# Patient Record
Sex: Female | Born: 1970 | Race: Black or African American | Hispanic: No | Marital: Married | Smoking: Former smoker
Health system: Southern US, Community
[De-identification: ages and names within clinical notes are randomized; demographics above are authoritative.]

## PROBLEM LIST (undated history)

## (undated) DIAGNOSIS — I639 Cerebral infarction, unspecified: Secondary | ICD-10-CM

## (undated) DIAGNOSIS — M1990 Unspecified osteoarthritis, unspecified site: Secondary | ICD-10-CM

## (undated) DIAGNOSIS — E119 Type 2 diabetes mellitus without complications: Secondary | ICD-10-CM

## (undated) DIAGNOSIS — G4700 Insomnia, unspecified: Secondary | ICD-10-CM

## (undated) DIAGNOSIS — I10 Essential (primary) hypertension: Secondary | ICD-10-CM

## (undated) DIAGNOSIS — L720 Epidermal cyst: Secondary | ICD-10-CM

## (undated) DIAGNOSIS — I517 Cardiomegaly: Secondary | ICD-10-CM

## (undated) DIAGNOSIS — N83202 Unspecified ovarian cyst, left side: Secondary | ICD-10-CM

## (undated) DIAGNOSIS — F32A Depression, unspecified: Secondary | ICD-10-CM

## (undated) DIAGNOSIS — E785 Hyperlipidemia, unspecified: Secondary | ICD-10-CM

## (undated) DIAGNOSIS — F419 Anxiety disorder, unspecified: Secondary | ICD-10-CM

## (undated) DIAGNOSIS — F329 Major depressive disorder, single episode, unspecified: Secondary | ICD-10-CM

## (undated) DIAGNOSIS — K219 Gastro-esophageal reflux disease without esophagitis: Secondary | ICD-10-CM

## (undated) DIAGNOSIS — M79671 Pain in right foot: Secondary | ICD-10-CM

## (undated) DIAGNOSIS — N83201 Unspecified ovarian cyst, right side: Secondary | ICD-10-CM

## (undated) DIAGNOSIS — G4730 Sleep apnea, unspecified: Secondary | ICD-10-CM

## (undated) DIAGNOSIS — E669 Obesity, unspecified: Secondary | ICD-10-CM

## (undated) DIAGNOSIS — F1910 Other psychoactive substance abuse, uncomplicated: Secondary | ICD-10-CM

## (undated) DIAGNOSIS — D219 Benign neoplasm of connective and other soft tissue, unspecified: Secondary | ICD-10-CM

## (undated) DIAGNOSIS — J45909 Unspecified asthma, uncomplicated: Secondary | ICD-10-CM

## (undated) HISTORY — DX: Unspecified asthma, uncomplicated: J45.909

## (undated) HISTORY — DX: Depression, unspecified: F32.A

## (undated) HISTORY — PX: KNEE SURGERY: SHX244

## (undated) HISTORY — PX: CHOLECYSTECTOMY: SHX55

## (undated) HISTORY — DX: Type 2 diabetes mellitus without complications: E11.9

## (undated) HISTORY — DX: Major depressive disorder, single episode, unspecified: F32.9

## (undated) HISTORY — DX: Other psychoactive substance abuse, uncomplicated: F19.10

## (undated) HISTORY — PX: HEEL SPUR SURGERY: SHX665

## (undated) HISTORY — PX: TUBAL LIGATION: SHX77

---

## 1998-04-04 ENCOUNTER — Emergency Department (HOSPITAL_COMMUNITY): Admission: EM | Admit: 1998-04-04 | Discharge: 1998-04-04 | Payer: Self-pay | Admitting: Emergency Medicine

## 1998-04-07 ENCOUNTER — Ambulatory Visit (HOSPITAL_COMMUNITY): Admission: RE | Admit: 1998-04-07 | Discharge: 1998-04-07 | Payer: Self-pay

## 1998-05-12 ENCOUNTER — Ambulatory Visit (HOSPITAL_COMMUNITY): Admission: RE | Admit: 1998-05-12 | Discharge: 1998-05-13 | Payer: Self-pay | Admitting: General Surgery

## 1999-01-14 ENCOUNTER — Emergency Department (HOSPITAL_COMMUNITY): Admission: EM | Admit: 1999-01-14 | Discharge: 1999-01-14 | Payer: Self-pay | Admitting: Emergency Medicine

## 2000-07-20 ENCOUNTER — Emergency Department (HOSPITAL_COMMUNITY): Admission: EM | Admit: 2000-07-20 | Discharge: 2000-07-20 | Payer: Self-pay | Admitting: Emergency Medicine

## 2000-07-20 ENCOUNTER — Encounter: Payer: Self-pay | Admitting: Emergency Medicine

## 2000-10-17 ENCOUNTER — Emergency Department (HOSPITAL_COMMUNITY): Admission: EM | Admit: 2000-10-17 | Discharge: 2000-10-17 | Payer: Self-pay | Admitting: Emergency Medicine

## 2000-12-15 ENCOUNTER — Emergency Department (HOSPITAL_COMMUNITY): Admission: EM | Admit: 2000-12-15 | Discharge: 2000-12-15 | Payer: Self-pay | Admitting: Emergency Medicine

## 2000-12-15 ENCOUNTER — Encounter: Payer: Self-pay | Admitting: Emergency Medicine

## 2001-08-25 ENCOUNTER — Other Ambulatory Visit: Admission: RE | Admit: 2001-08-25 | Discharge: 2001-08-25 | Payer: Self-pay | Admitting: Obstetrics and Gynecology

## 2001-10-03 ENCOUNTER — Emergency Department (HOSPITAL_COMMUNITY): Admission: EM | Admit: 2001-10-03 | Discharge: 2001-10-03 | Payer: Self-pay | Admitting: Emergency Medicine

## 2002-02-23 ENCOUNTER — Emergency Department (HOSPITAL_COMMUNITY): Admission: EM | Admit: 2002-02-23 | Discharge: 2002-02-23 | Payer: Self-pay | Admitting: Emergency Medicine

## 2002-03-04 ENCOUNTER — Emergency Department (HOSPITAL_COMMUNITY): Admission: EM | Admit: 2002-03-04 | Discharge: 2002-03-04 | Payer: Self-pay | Admitting: Emergency Medicine

## 2002-06-26 ENCOUNTER — Emergency Department (HOSPITAL_COMMUNITY): Admission: EM | Admit: 2002-06-26 | Discharge: 2002-06-26 | Payer: Self-pay | Admitting: Emergency Medicine

## 2002-06-29 ENCOUNTER — Emergency Department (HOSPITAL_COMMUNITY): Admission: EM | Admit: 2002-06-29 | Discharge: 2002-06-29 | Payer: Self-pay | Admitting: Emergency Medicine

## 2003-10-10 ENCOUNTER — Emergency Department (HOSPITAL_COMMUNITY): Admission: EM | Admit: 2003-10-10 | Discharge: 2003-10-10 | Payer: Self-pay | Admitting: Emergency Medicine

## 2003-10-15 ENCOUNTER — Emergency Department (HOSPITAL_COMMUNITY): Admission: EM | Admit: 2003-10-15 | Discharge: 2003-10-15 | Payer: Self-pay | Admitting: Emergency Medicine

## 2004-08-06 ENCOUNTER — Emergency Department (HOSPITAL_COMMUNITY): Admission: EM | Admit: 2004-08-06 | Discharge: 2004-08-06 | Payer: Self-pay | Admitting: Emergency Medicine

## 2004-11-19 ENCOUNTER — Emergency Department (HOSPITAL_COMMUNITY): Admission: EM | Admit: 2004-11-19 | Discharge: 2004-11-20 | Payer: Self-pay | Admitting: Emergency Medicine

## 2005-08-09 ENCOUNTER — Emergency Department (HOSPITAL_COMMUNITY): Admission: EM | Admit: 2005-08-09 | Discharge: 2005-08-09 | Payer: Self-pay | Admitting: Emergency Medicine

## 2005-09-25 ENCOUNTER — Emergency Department (HOSPITAL_COMMUNITY): Admission: EM | Admit: 2005-09-25 | Discharge: 2005-09-26 | Payer: Self-pay | Admitting: Emergency Medicine

## 2006-03-15 ENCOUNTER — Emergency Department (HOSPITAL_COMMUNITY): Admission: EM | Admit: 2006-03-15 | Discharge: 2006-03-15 | Payer: Self-pay | Admitting: *Deleted

## 2006-10-13 ENCOUNTER — Ambulatory Visit: Payer: Self-pay | Admitting: Obstetrics and Gynecology

## 2006-10-30 ENCOUNTER — Encounter: Admission: RE | Admit: 2006-10-30 | Discharge: 2006-10-30 | Payer: Self-pay | Admitting: Obstetrics and Gynecology

## 2006-11-02 ENCOUNTER — Ambulatory Visit (HOSPITAL_COMMUNITY): Admission: RE | Admit: 2006-11-02 | Discharge: 2006-11-02 | Payer: Self-pay | Admitting: Obstetrics and Gynecology

## 2007-01-08 ENCOUNTER — Emergency Department (HOSPITAL_COMMUNITY): Admission: EM | Admit: 2007-01-08 | Discharge: 2007-01-08 | Payer: Self-pay | Admitting: *Deleted

## 2007-05-03 ENCOUNTER — Emergency Department (HOSPITAL_COMMUNITY): Admission: EM | Admit: 2007-05-03 | Discharge: 2007-05-04 | Payer: Self-pay | Admitting: Emergency Medicine

## 2007-05-18 ENCOUNTER — Ambulatory Visit (HOSPITAL_COMMUNITY): Admission: AD | Admit: 2007-05-18 | Discharge: 2007-05-20 | Payer: Self-pay | Admitting: Orthopedic Surgery

## 2007-08-09 ENCOUNTER — Ambulatory Visit: Payer: Self-pay | Admitting: *Deleted

## 2007-08-14 ENCOUNTER — Ambulatory Visit (HOSPITAL_COMMUNITY): Admission: RE | Admit: 2007-08-14 | Discharge: 2007-08-14 | Payer: Self-pay | Admitting: Obstetrics & Gynecology

## 2008-04-02 ENCOUNTER — Emergency Department (HOSPITAL_COMMUNITY): Admission: EM | Admit: 2008-04-02 | Discharge: 2008-04-02 | Payer: Self-pay | Admitting: Emergency Medicine

## 2008-06-10 ENCOUNTER — Emergency Department (HOSPITAL_COMMUNITY): Admission: EM | Admit: 2008-06-10 | Discharge: 2008-06-11 | Payer: Self-pay | Admitting: Emergency Medicine

## 2008-10-30 ENCOUNTER — Emergency Department (HOSPITAL_COMMUNITY): Admission: EM | Admit: 2008-10-30 | Discharge: 2008-10-30 | Payer: Self-pay | Admitting: Emergency Medicine

## 2009-02-22 ENCOUNTER — Emergency Department (HOSPITAL_COMMUNITY): Admission: EM | Admit: 2009-02-22 | Discharge: 2009-02-22 | Payer: Self-pay | Admitting: Emergency Medicine

## 2009-04-15 ENCOUNTER — Emergency Department (HOSPITAL_COMMUNITY): Admission: EM | Admit: 2009-04-15 | Discharge: 2009-04-16 | Payer: Self-pay | Admitting: Emergency Medicine

## 2009-05-07 ENCOUNTER — Encounter (INDEPENDENT_AMBULATORY_CARE_PROVIDER_SITE_OTHER): Payer: Self-pay | Admitting: Obstetrics & Gynecology

## 2009-05-07 ENCOUNTER — Ambulatory Visit: Payer: Self-pay | Admitting: Obstetrics and Gynecology

## 2009-07-27 ENCOUNTER — Emergency Department (HOSPITAL_COMMUNITY): Admission: EM | Admit: 2009-07-27 | Discharge: 2009-07-27 | Payer: Self-pay | Admitting: Emergency Medicine

## 2009-10-12 ENCOUNTER — Emergency Department (HOSPITAL_COMMUNITY): Admission: EM | Admit: 2009-10-12 | Discharge: 2009-10-12 | Payer: Self-pay | Admitting: Emergency Medicine

## 2009-11-04 ENCOUNTER — Emergency Department (HOSPITAL_COMMUNITY): Admission: EM | Admit: 2009-11-04 | Discharge: 2009-11-04 | Payer: Self-pay | Admitting: Emergency Medicine

## 2010-02-17 ENCOUNTER — Emergency Department (HOSPITAL_COMMUNITY): Admission: EM | Admit: 2010-02-17 | Discharge: 2010-02-17 | Payer: Self-pay | Admitting: Emergency Medicine

## 2010-05-24 ENCOUNTER — Encounter: Admission: RE | Admit: 2010-05-24 | Discharge: 2010-05-24 | Payer: Self-pay | Admitting: Orthopedic Surgery

## 2010-06-12 ENCOUNTER — Emergency Department (HOSPITAL_COMMUNITY): Admission: EM | Admit: 2010-06-12 | Discharge: 2010-06-12 | Payer: Self-pay | Admitting: Emergency Medicine

## 2010-08-09 ENCOUNTER — Emergency Department (HOSPITAL_COMMUNITY): Admission: EM | Admit: 2010-08-09 | Discharge: 2010-08-09 | Payer: Self-pay | Admitting: Emergency Medicine

## 2010-09-22 ENCOUNTER — Encounter
Admission: RE | Admit: 2010-09-22 | Discharge: 2010-09-22 | Payer: Self-pay | Source: Home / Self Care | Attending: Orthopedic Surgery | Admitting: Orthopedic Surgery

## 2010-10-01 ENCOUNTER — Emergency Department (HOSPITAL_COMMUNITY)
Admission: EM | Admit: 2010-10-01 | Discharge: 2010-10-01 | Payer: Self-pay | Source: Home / Self Care | Admitting: Emergency Medicine

## 2010-11-01 ENCOUNTER — Encounter: Payer: Self-pay | Admitting: Specialist

## 2010-11-01 ENCOUNTER — Encounter: Payer: Self-pay | Admitting: Obstetrics and Gynecology

## 2010-11-01 ENCOUNTER — Encounter: Payer: Self-pay | Admitting: Orthopedic Surgery

## 2010-11-02 ENCOUNTER — Encounter: Payer: Self-pay | Admitting: *Deleted

## 2010-11-03 ENCOUNTER — Ambulatory Visit (HOSPITAL_COMMUNITY): Admission: RE | Admit: 2010-11-03 | Payer: Self-pay | Source: Home / Self Care

## 2010-11-11 ENCOUNTER — Encounter: Payer: Self-pay | Admitting: Orthopedic Surgery

## 2010-12-04 ENCOUNTER — Emergency Department (HOSPITAL_COMMUNITY): Payer: Self-pay

## 2010-12-04 ENCOUNTER — Emergency Department (HOSPITAL_COMMUNITY)
Admission: EM | Admit: 2010-12-04 | Discharge: 2010-12-04 | Disposition: A | Discharge status: Home / Self Care | Payer: Self-pay | Attending: Emergency Medicine | Admitting: Emergency Medicine

## 2010-12-04 DIAGNOSIS — M25519 Pain in unspecified shoulder: Secondary | ICD-10-CM | POA: Insufficient documentation

## 2010-12-04 DIAGNOSIS — E119 Type 2 diabetes mellitus without complications: Secondary | ICD-10-CM | POA: Insufficient documentation

## 2010-12-04 DIAGNOSIS — I10 Essential (primary) hypertension: Secondary | ICD-10-CM | POA: Insufficient documentation

## 2011-01-11 ENCOUNTER — Emergency Department (HOSPITAL_COMMUNITY)
Admission: EM | Admit: 2011-01-11 | Discharge: 2011-01-11 | Disposition: A | Discharge status: Home / Self Care | Payer: Self-pay | Attending: Emergency Medicine | Admitting: Emergency Medicine

## 2011-01-11 DIAGNOSIS — R059 Cough, unspecified: Secondary | ICD-10-CM | POA: Insufficient documentation

## 2011-01-11 DIAGNOSIS — Z9981 Dependence on supplemental oxygen: Secondary | ICD-10-CM | POA: Insufficient documentation

## 2011-01-11 DIAGNOSIS — J4489 Other specified chronic obstructive pulmonary disease: Secondary | ICD-10-CM | POA: Insufficient documentation

## 2011-01-11 DIAGNOSIS — J449 Chronic obstructive pulmonary disease, unspecified: Secondary | ICD-10-CM | POA: Insufficient documentation

## 2011-01-11 DIAGNOSIS — R05 Cough: Secondary | ICD-10-CM | POA: Insufficient documentation

## 2011-01-11 DIAGNOSIS — R062 Wheezing: Secondary | ICD-10-CM | POA: Insufficient documentation

## 2011-01-14 LAB — CBC
HCT: 38.9 % (ref 36.0–46.0)
Hemoglobin: 13 g/dL (ref 12.0–15.0)
MCV: 90.3 fL (ref 78.0–100.0)
Platelets: 398 10*3/uL (ref 150–400)
RDW: 14.4 % (ref 11.5–15.5)
WBC: 8.1 10*3/uL (ref 4.0–10.5)

## 2011-01-14 LAB — URINALYSIS, ROUTINE W REFLEX MICROSCOPIC
Ketones, ur: NEGATIVE mg/dL
Nitrite: NEGATIVE
Protein, ur: NEGATIVE mg/dL

## 2011-01-14 LAB — DIFFERENTIAL
Eosinophils Absolute: 0.2 10*3/uL (ref 0.0–0.7)
Eosinophils Relative: 3 % (ref 0–5)
Lymphocytes Relative: 43 % (ref 12–46)
Lymphs Abs: 3.5 10*3/uL (ref 0.7–4.0)
Monocytes Absolute: 0.3 10*3/uL (ref 0.1–1.0)

## 2011-01-19 LAB — COMPREHENSIVE METABOLIC PANEL
AST: 21 U/L (ref 0–37)
BUN: 14 mg/dL (ref 6–23)
CO2: 24 mEq/L (ref 19–32)
Calcium: 9.3 mg/dL (ref 8.4–10.5)
Chloride: 106 mEq/L (ref 96–112)
Creatinine, Ser: 1.1 mg/dL (ref 0.4–1.2)
GFR calc non Af Amer: 55 mL/min — ABNORMAL LOW (ref >60)
Glucose, Bld: 110 mg/dL — ABNORMAL HIGH (ref 70–99)
Total Bilirubin: 0.5 mg/dL (ref 0.3–1.2)

## 2011-01-19 LAB — CBC
HCT: 37.3 % (ref 36.0–46.0)
MCHC: 33 g/dL (ref 30.0–36.0)
MCV: 89.8 fL (ref 78.0–100.0)
Platelets: 370 10*3/uL (ref 150–400)
RDW: 14.3 % (ref 11.5–15.5)

## 2011-01-19 LAB — LIPASE, BLOOD: Lipase: 20 U/L (ref 11–59)

## 2011-01-19 LAB — DIFFERENTIAL
Basophils Absolute: 0 10*3/uL (ref 0.0–0.1)
Eosinophils Relative: 1 % (ref 0–5)
Lymphocytes Relative: 36 % (ref 12–46)
Neutro Abs: 4.9 10*3/uL (ref 1.7–7.7)
Neutrophils Relative %: 57 % (ref 43–77)

## 2011-01-19 LAB — TROPONIN I: Troponin I: 0.01 ng/mL (ref 0.00–0.06)

## 2011-01-19 LAB — CK TOTAL AND CKMB (NOT AT ARMC): Total CK: 158 U/L (ref 7–177)

## 2011-02-23 NOTE — Group Therapy Note (Signed)
Joanna Newman, Joanna Newman NO.:  [id number]   MEDICAL RECORD NO.:  [id number]          PATIENT TYPE:  WOC   LOCATION:  [facility]                   FACILITY:  [facility]   PHYSICIAN:  Dorthula Perfect, MD     DATE OF BIRTH:  1970/11/20   DATE OF SERVICE:  05/07/2009                                  CLINIC NOTE   A 40 year old African American female gravida 3, para 3, last menstrual  period the first part of July, who comes today for Pap smear, annual  visit and STD testing.  Her husband is a Naval architect.  She was last  seen here in October 2008.  At that time, she had a history of a right  ovarian cyst.  She is having a problem with cramping with her periods.   Her menstrual periods are cyclic and last at least 10 days and often has  spotting the rest of the month.  She will use up to 3 overnight pads at  a time.  She has often lot of clotting.  She was seen in the medical  clinic a few weeks ago and was told that she had low blood, and was  put on ferrous sulfate.   Ultrasound in November 2008 showed a stable cervical fibroid.  It showed  a right unilocular cyst measuring 4.5 x 3.4 x 3.4 cm.  She was supposed  to come back for a followup, but did not return.   OPERATIONS:  Tubal sterilization, cholecystectomy, and umbilical hernia.   ALLERGIES:  None.   MEDICATIONS:  Listed in the front of the chart.   FAMILY HISTORY:  Significant and that a maternal grandmother who has had  breast cancer.  The patient has not had a mammogram.   Exam; height 5 feet 4, weight 319 pounds, blood pressure 122/74.  Thyroid is normal.  Her breasts are bilaterally normal.  No masses or  cysts are noted.  Her abdomen is very much obese.  No masses are felt.  She states during the exam that every now and then she gets sort of a  rolling pain in the upper abdomen that goes from one side to the other.  She has no history of constipation or diarrhea.  Pelvic exam, external  genitalia and BUS  glands are normal.  Vaginal vault epithelialized as  was the cervix.  The uterus and the adnexal areas were examined, but  could not be felt because of her size.   IMPRESSION:  1. Menometrorrhagia with secondary anemia by history.  2. Upper abdominal pain.   DISPOSITION:  1. Pap smear with STD testing.  2. She was scheduled for an ultrasound to reevaluate the uterus and      the ovaries.  3. Return in 2-3 weeks for results and endometrial biopsy at that      time.           ______________________________  Dorthula Perfect, MD     ER/MEDQ  D:  05/07/2009  T:  05/08/2009  Job:  [id number]

## 2011-02-23 NOTE — Op Note (Signed)
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   PROCEDURE:  Bilateral gastrocnemius resection.   SURGEON:  Aldean Baker, MD   ANESTHESIA:  General.   ESTIMATED BLOOD LOSS:  Minimal.   ANTIBIOTICS:  2 gram of Kefzol.   DRAINS:  None.   COMPLICATIONS:  None.   TOURNIQUET TIME:  None.   DISPOSITION:  To PACU in stable condition.   INDICATIONS FOR PROCEDURE:  Patient is a 40 year old woman with  bilateral heel cord contractures.  She has failed conservative care with  heel cord stretching, has persistent plantar fascia, and Achilles  tendinitis.  She presents at this time for surgical intervention.  Risks  and benefits were discussed, including infection, neurovascular injury,  numbness, nonhealing of wound, need for additional surgery.  Patient  states she understands and wishes to proceed at this time.   DESCRIPTION OF PROCEDURE:  Patient was brought OR 1 and underwent a  general anesthetic.  After adequate levels of anesthesia obtained, the  patient's both lower extremities were prepped using DuraPrep and draped  into a sterile field.  Attention was first focused on the left lower  extremity.  A medial longitudinal incision was made 15 cm proximal to  the medial malleolus.  Blunt dissection was carried down to the  gastrocnemius fascia and the gastrocnemius fascia was released.  This  brought the patient's foot from neutral dorsiflexion to about 30 degrees  of dorsiflexion.  The wound was irrigated and the incision was  closed  using a modified vertical mattress suture with 2-0 nylon. Attention was  then focused on the right gastrocnemius fascia.  A vertical longitudinal  incision was also made 15 cm proximal to the medial malleolus.  Blunt  dissection was carried down to the gastrocnemius fascia, which was then  released.  The patient's dorsiflexion went from neutral dorsiflexion to  about 30 degrees of dorsiflexion.  The wound was irrigated and the  incision closed with a modified vertical mattress suture with 2-0  nylon.  The wounds were covered with Adaptic, orthopedic sponges, Kerlix  and a Coban dressing.  The patient was extubated, taken to PACU in  stable condition.  Plans are for a 23-hour observation due to the fact  that the patient requires nasal cannula O2 in the evening and plan for  discharge in the morning after physical therapy.      Nadara Mustard, MD  Electronically Signed     MVD/MEDQ  D:  05/18/2007  T:  05/18/2007  Job:  [id number]

## 2011-02-23 NOTE — Group Therapy Note (Signed)
NAMEMONEY, MCKEITHAN NO.:  [id number]   MEDICAL RECORD NO.:  [id number]          PATIENT TYPE:  WOC   LOCATION:  [facility]                   FACILITY:  [facility]   PHYSICIAN:  Karlton Lemon, MD      DATE OF BIRTH:  [birth year]   DATE OF SERVICE:                                  CLINIC NOTE   CHIEF COMPLAINT:  Follow up Pap smear and cultures.   HISTORY OF PRESENT ILLNESS:  This is a 40 year old gravida 5, para 3 who  comes in for a followup Pap smear.  Patient states that she was  instructed to follow up at this time for Pap smear.  In review of her  chart, her last Pap smear was in January, 2008 and was found to be  negative at that time.  She was sent a letter stating to follow up in  January, 2009.  Discussed options for her getting a Pap smear today  versus waiting until January.  Due to Medicaid constraints on repeating  a Pap smear early, the patient would like to wait until January, 2009.  In review of her chart, it was noted that she had a transabdominal and  transvaginal pelvic ultrasound performed on November 02, 2006 showing a  right ovarian simple cyst.  Recommendation at that time was to follow up  in 6-8 weeks to determine resolution.  Patient states that she felt like  this ultrasound was done 6-8 weeks later showing resolution of the cyst.  This report was not found, and there is some question as to whether this  study was performed.  Patient states that her pelvic pain has resolved  and that her pain has lightened.  She states that her periods are now  monthly, and she does have some severe cramping during them, but she  states that this is controlled well with Vicodin, which she has received  for surgery to her ankles and chronic pain related to that.  Patient has  no other complaints today.  Would like to follow up anal examination and  Pap smear in January, 2009.   PAST MEDICAL HISTORY:  Significant for allergies, diabetes, depression,  asthma,  and hypertension.   MEDICATIONS:  Albuterol, Zyrtec, Benicar, hydrocodone, Singulair,  Metformin, Lorazepam, Celexa.   PAST SURGICAL HISTORY:  Bilateral ankle surgery.  Patient has had no  other surgeries.   ALLERGIES:  Patient has no known drug allergies.   PHYSICAL EXAMINATION:  GENERAL:  This is a well-appearing, obese female  in no distress.  VITALS:  Temperature 96.9, pulse 84, blood pressure 116/83.  Weight 115  pounds.  CARDIOVASCULAR:  Heart has a regular rate and rhythm.  No murmurs, rubs  or gallops.  RESPIRATORY:  Lungs are clear to auscultation bilaterally.  ABDOMEN:  Obese, soft, nontender to palpation.  Positive bowel sounds  noted in all four quadrants.  No masses are palpable.  GENITOURINARY:  Speculum exam is not performed.  Bimanual examination is  difficult due to body habitus.  Uterus feels normal size, but this is  difficult to evaluate.  Adnexa are unable to be palpated secondary to  body  habitus.   ASSESSMENT/PLAN:  This is a 40 year old gravida 5, para 3 with a history  of right simple ovarian cyst and is following for Pap smear, although  she is deferring this to one year from her previous Pap smear.  1. Will schedule for a repeat pelvic ultrasound to follow this ovarian      cyst, as recommended back in January.  Expect resolution.  2. Patient is to follow up in four weeks for discussion of results of      this ultrasound.  3. Patient is to follow up in January, 2009 for yearly examination,      including Pap smear.           ______________________________  Karlton Lemon, MD     NS/MEDQ  D:  08/09/2007  T:  08/10/2007  Job:  [id number]

## 2011-02-26 NOTE — Group Therapy Note (Signed)
Joanna Newman, ECKART NO.:  [id number]   MEDICAL RECORD NO.:  [id number]          PATIENT TYPE:  WOC   LOCATION:  [facility]                   FACILITY:  [facility]   PHYSICIAN:  Argentina Donovan, MD        DATE OF BIRTH:  1971/01/07   DATE OF SERVICE:  10/13/2006                                  CLINIC NOTE   The patient is a 40 year old G5, P3 who comes in as a followup on an  ultrasound, back pain, and for a pap smear.   PROBLEMS:  1. Ultrasound/back pain.  The patient has had increased vaginal      bleeding since 05/2006 when she bleeds in between her periods at      times.  She also has had increasing back pain for a similar time.      She describes the back pain as along her spine and radiating down      both legs. The pain is on the right side or the left side.  She had      an ultrasound 09/09/2006 which showed a right complex cyst and they      recommended getting a followup ultrasound in 6-8 weeks.  The      patient says that she has had no loss of blood or bowel function.  2. Rectal bleeding.  The patient says that she has had bright red      blood per rectum.  She does have a history of hemorrhoids, but she      says that this feels different.  3. Pap smear.  The patient is due for a Pap.  She has had abnormal      Paps in the past, and a colposcopy in 2000.  She has not had any      abnormal Pap smears since then.   PHYSICAL EXAMINATION:  GENERAL:  An obese appearing African-American  female in no apparent distress.  GU EXAM:  Normal mons and labia.  Cervix appears normal with no exudate  or discharge.  There was no cervical motion tenderness.  A Pap smear was  done.  On bimanual exam there was a normal-appearing uterus.  Ovaries  were not felt.   ASSESSMENT/PLAN:  1. Back pain.  It was felt that the pain was more likely related to      possible spinal abnormality as opposed to a small, right ovarian      complex cyst.  We plan to get an MRI of the lower  lumbar spine to      evaluate for disk disease and then refer on to an orthopedic versus      a neurosurgeon.  For her right ovarian cyst we will have a followup      ultrasound at 6 weeks' to make sure that this is decreasing in      size.  2. Rectal lesion.  The patient is __________ for colon cancer.  We      will have the patient see Dr. Loreta Ave plan on getting a colonoscopy,      this of course is more likely hemorrhoids.  3.  Pap smear.  A Pap was done today.  Again, the patient has a history      of abnormal Paps.  It is important for her to continue to followup      annually.   Dictated by Rolm Gala, M.D. for Argentina Donovan, MD     ______________________________  Argentina Donovan, MD    ______________________________  Argentina Donovan, MD    PR/MEDQ  D:  10/13/2006  T:  10/13/2006  Job:  [id number]

## 2011-07-08 LAB — DIFFERENTIAL
Basophils Absolute: 0.1
Basophils Relative: 1
Eosinophils Absolute: 0.1
Eosinophils Relative: 1
Lymphocytes Relative: 36
Lymphs Abs: 3.7
Monocytes Absolute: 0.5
Monocytes Relative: 5
Neutro Abs: 5.8
Neutrophils Relative %: 57

## 2011-07-08 LAB — POCT CARDIAC MARKERS
CKMB, poc: 1.3
Myoglobin, poc: 63
Operator id: 290111
Troponin i, poc: <0.05

## 2011-07-08 LAB — CBC
HCT: 38.1
Hemoglobin: 12.8
MCHC: 33.5
MCV: 88.9
Platelets: 496 — ABNORMAL HIGH
RBC: 4.28
RDW: 14.8
WBC: 10.2

## 2011-07-08 LAB — RAPID URINE DRUG SCREEN, HOSP PERFORMED
Amphetamines: NOT DETECTED
Barbiturates: NOT DETECTED
Benzodiazepines: POSITIVE — AB
Cocaine: POSITIVE — AB
Opiates: NOT DETECTED
Tetrahydrocannabinol: NOT DETECTED

## 2011-07-08 LAB — BASIC METABOLIC PANEL
BUN: 13
CO2: 25
Chloride: 101
Potassium: 3.5

## 2011-07-08 LAB — BASIC METABOLIC PANEL WITH GFR
Calcium: 8.8
Creatinine, Ser: 1.08
GFR calc Af Amer: >60
GFR calc non Af Amer: 57 — ABNORMAL LOW
Glucose, Bld: 112 — ABNORMAL HIGH
Sodium: 135

## 2011-07-08 LAB — D-DIMER, QUANTITATIVE: D-Dimer, Quant: <0.22

## 2011-07-08 LAB — B-NATRIURETIC PEPTIDE (CONVERTED LAB): Pro B Natriuretic peptide (BNP): 36.9

## 2011-07-26 LAB — BASIC METABOLIC PANEL
GFR calc Af Amer: >60
GFR calc non Af Amer: >60
Potassium: 4
Sodium: 136

## 2011-07-26 LAB — COMPREHENSIVE METABOLIC PANEL
ALT: 22
Calcium: 8.7
Glucose, Bld: 118 — ABNORMAL HIGH
Sodium: 134 — ABNORMAL LOW
Total Protein: 6.6

## 2011-07-26 LAB — CBC
HCT: 36.6
Hemoglobin: 12.3
Hemoglobin: 12.4
MCHC: 34.4
Platelets: 435 — ABNORMAL HIGH
RBC: 4.07
RDW: 14.2 — ABNORMAL HIGH
WBC: 9.8

## 2011-07-26 LAB — DIFFERENTIAL
Eosinophils Absolute: 0.1
Lymphocytes Relative: 37
Lymphs Abs: 4 — ABNORMAL HIGH
Monocytes Relative: 3
Neutro Abs: 6.2
Neutrophils Relative %: 58

## 2011-07-26 LAB — POCT CARDIAC MARKERS
CKMB, poc: <1 — ABNORMAL LOW
Myoglobin, poc: 59.9
Troponin i, poc: <0.05

## 2011-07-26 LAB — HEPATIC FUNCTION PANEL
ALT: 20
AST: 22
Bilirubin, Direct: 0.1
Total Bilirubin: 0.4

## 2011-07-27 ENCOUNTER — Emergency Department (HOSPITAL_COMMUNITY)
Admission: EM | Admit: 2011-07-27 | Discharge: 2011-07-27 | Disposition: A | Discharge status: Home / Self Care | Payer: Self-pay | Attending: Emergency Medicine | Admitting: Emergency Medicine

## 2011-07-27 ENCOUNTER — Emergency Department (HOSPITAL_COMMUNITY): Payer: Self-pay

## 2011-07-27 DIAGNOSIS — R0602 Shortness of breath: Secondary | ICD-10-CM | POA: Insufficient documentation

## 2011-07-27 DIAGNOSIS — I10 Essential (primary) hypertension: Secondary | ICD-10-CM | POA: Insufficient documentation

## 2011-07-27 DIAGNOSIS — R071 Chest pain on breathing: Secondary | ICD-10-CM | POA: Insufficient documentation

## 2011-07-27 DIAGNOSIS — R079 Chest pain, unspecified: Secondary | ICD-10-CM | POA: Insufficient documentation

## 2011-07-27 DIAGNOSIS — R0989 Other specified symptoms and signs involving the circulatory and respiratory systems: Secondary | ICD-10-CM | POA: Insufficient documentation

## 2011-07-27 DIAGNOSIS — R0609 Other forms of dyspnea: Secondary | ICD-10-CM | POA: Insufficient documentation

## 2011-08-10 ENCOUNTER — Emergency Department (HOSPITAL_COMMUNITY): Payer: Self-pay

## 2011-08-10 ENCOUNTER — Emergency Department (HOSPITAL_COMMUNITY)
Admission: EM | Admit: 2011-08-10 | Discharge: 2011-08-11 | Disposition: A | Discharge status: Home / Self Care | Payer: Self-pay | Attending: Emergency Medicine | Admitting: Emergency Medicine

## 2011-08-10 DIAGNOSIS — M545 Low back pain, unspecified: Secondary | ICD-10-CM | POA: Insufficient documentation

## 2011-08-10 DIAGNOSIS — F3289 Other specified depressive episodes: Secondary | ICD-10-CM | POA: Insufficient documentation

## 2011-08-10 DIAGNOSIS — R079 Chest pain, unspecified: Secondary | ICD-10-CM | POA: Insufficient documentation

## 2011-08-10 DIAGNOSIS — R0602 Shortness of breath: Secondary | ICD-10-CM | POA: Insufficient documentation

## 2011-08-10 DIAGNOSIS — J449 Chronic obstructive pulmonary disease, unspecified: Secondary | ICD-10-CM | POA: Insufficient documentation

## 2011-08-10 DIAGNOSIS — J4489 Other specified chronic obstructive pulmonary disease: Secondary | ICD-10-CM | POA: Insufficient documentation

## 2011-08-10 DIAGNOSIS — F329 Major depressive disorder, single episode, unspecified: Secondary | ICD-10-CM | POA: Insufficient documentation

## 2011-08-10 DIAGNOSIS — I10 Essential (primary) hypertension: Secondary | ICD-10-CM | POA: Insufficient documentation

## 2011-09-01 ENCOUNTER — Emergency Department (HOSPITAL_COMMUNITY)
Admission: EM | Admit: 2011-09-01 | Discharge: 2011-09-01 | Disposition: A | Discharge status: Home / Self Care | Payer: Self-pay | Attending: Emergency Medicine | Admitting: Emergency Medicine

## 2011-09-01 DIAGNOSIS — J45909 Unspecified asthma, uncomplicated: Secondary | ICD-10-CM | POA: Insufficient documentation

## 2011-09-01 MED ORDER — IPRATROPIUM BROMIDE 0.02 % IN SOLN
0.5000 mg | Freq: Once | RESPIRATORY_TRACT | Status: AC
  Administered 2011-09-01: 0.5 mg via RESPIRATORY_TRACT
  Filled 2011-09-01: qty 2.5

## 2011-09-01 MED ORDER — PREDNISONE 20 MG PO TABS
60.0000 mg | ORAL_TABLET | Freq: Once | ORAL | Status: AC
  Administered 2011-09-01: 60 mg via ORAL
  Filled 2011-09-01: qty 3

## 2011-09-01 MED ORDER — PREDNISONE 10 MG PO TABS: 50.0000 mg | ORAL_TABLET | Freq: Every day | ORAL | Status: AC

## 2011-09-01 MED ORDER — ALBUTEROL SULFATE (5 MG/ML) 0.5% IN NEBU
5.0000 mg | INHALATION_SOLUTION | Freq: Once | RESPIRATORY_TRACT | Status: AC
  Administered 2011-09-01: 5 mg via RESPIRATORY_TRACT
  Filled 2011-09-01: qty 1

## 2011-09-01 MED ORDER — PREDNISONE 10 MG PO TABS: 20.0000 mg | ORAL_TABLET | Freq: Every day | ORAL | Status: DC

## 2011-09-01 MED ORDER — ALBUTEROL SULFATE (5 MG/ML) 0.5% IN NEBU
2.5000 mg | INHALATION_SOLUTION | Freq: Once | RESPIRATORY_TRACT | Status: AC
  Administered 2011-09-01: 5 mg via RESPIRATORY_TRACT
  Filled 2011-09-01: qty 1

## 2011-09-01 NOTE — ED Notes (Signed)
Patient reports ongoing cough x 1 month. Was seen [facility] for same and given breathing treatments for her bronchitis. Here for persistent cough, no distress

## 2011-09-01 NOTE — ED Provider Notes (Signed)
History     CSN: [id number] Arrival date & time: 09/01/2011  8:57 AM   First MD Initiated Contact with Patient 09/01/11 0913      No chief complaint on file.   (Consider location/radiation/quality/duration/timing/severity/associated sxs/prior treatment) HPI Complains of cough ,wheezeand nasal congestion onset approximately 3 weeks ago improved after treatment with prednisone and albuterol. Became worse again last night. No fever treated with albuterol nebulizer and inhaler with transient relief.. Patient feels that she benefited from prednisone prescribed at her last emergency department visit. Symptoms have improved since having received one nebulized treatment in the emergency department prior to my exam No past medical history on file. Asthma No past surgical history on file.  No family history on file.  History  Substance Use Topics  . Smoking status: Passive Smoker  . Smokeless tobacco: Not on file  . Alcohol Use: No    OB History    Grav Para Term Preterm Abortions TAB SAB Ect Mult Living                  Review of Systems  Constitutional: Negative.   HENT: Positive for congestion.   Respiratory: Positive for cough and wheezing.   Cardiovascular: Negative.   Gastrointestinal: Negative.   Musculoskeletal: Negative.   Skin: Negative.   Neurological: Negative.   Hematological: Negative.   Psychiatric/Behavioral: Negative.     Allergies  Review of patient's allergies indicates no known allergies.  Home Medications   Current Outpatient Rx  Name Route Sig Dispense Refill  . ALBUTEROL SULFATE (2.5 MG/3ML) 0.083% IN NEBU Nebulization Take 2.5 mg by nebulization every 6 (six) hours as needed.      [name] FLUTICASONE-SALMETEROL 115-21 MCG/ACT IN AERO Inhalation Inhale 2 puffs into the lungs 2 (two) times daily.        BP 160/92  Pulse 93  Temp(Src) 98.2 F (36.8 C) (Oral)  Resp 24  SpO2 98%  Physical Exam  Nursing note and vitals reviewed. Constitutional: She  appears well-developed and well-nourished.  HENT:  Head: Normocephalic and atraumatic.       Nasal congestion  Eyes: Conjunctivae are normal. Pupils are equal, round, and reactive to light.  Neck: Neck supple. No tracheal deviation present. No thyromegaly present.  Cardiovascular: Normal rate and regular rhythm.   No murmur heard. Pulmonary/Chest: Effort normal. She has wheezes.       No respiratory distress mild end expiratory wheezes no use of accessory muscles  Abdominal: Soft. Bowel sounds are normal. She exhibits no distension. There is no tenderness.  Musculoskeletal: Normal range of motion. She exhibits no edema and no tenderness.  Neurological: She is alert. Coordination normal.  Skin: Skin is warm and dry. No rash noted.  Psychiatric: She has a normal mood and affect.    ED Course  Procedures (including critical care time)  Labs Reviewed - No data to display No results found.   No diagnosis found.  11:20 AM after 2 nebulized treatments breathing is at baseline she speaks iin paragraphs. Lungs clear to auscutation  MDM  Plan; instructed to use a beer all nebulized treatment or inhaler every 4 hours as needed for cough or shortness of breath. Prescription prednisone return if albuterol needed more than every 4 hours. or see PMD. Mucinex for nasal congestion Diagnosis asthmatic bronchitis        Doug Sou, MD 09/01/11 1125

## 2011-09-01 NOTE — ED Notes (Signed)
Pt will follow with PCP

## 2012-04-21 ENCOUNTER — Ambulatory Visit: Payer: Self-pay | Admitting: Family Medicine

## 2012-05-18 ENCOUNTER — Ambulatory Visit (INDEPENDENT_AMBULATORY_CARE_PROVIDER_SITE_OTHER): Payer: Self-pay | Admitting: Family Medicine

## 2012-05-18 ENCOUNTER — Encounter: Payer: Self-pay | Admitting: Family Medicine

## 2012-05-18 VITALS — BP 134/88 | HR 93 | Ht 64.0 in | Wt 322.0 lb

## 2012-05-18 DIAGNOSIS — N898 Other specified noninflammatory disorders of vagina: Secondary | ICD-10-CM

## 2012-05-18 DIAGNOSIS — Z6841 Body Mass Index (BMI) 40.0 and over, adult: Secondary | ICD-10-CM

## 2012-05-18 DIAGNOSIS — L293 Anogenital pruritus, unspecified: Secondary | ICD-10-CM

## 2012-05-18 MED ORDER — FLUCONAZOLE 150 MG PO TABS: 150.0000 mg | ORAL_TABLET | Freq: Once | ORAL | Status: AC

## 2012-05-18 MED ORDER — ALBUTEROL SULFATE HFA 108 (90 BASE) MCG/ACT IN AERS: 2.0000 | INHALATION_SPRAY | Freq: Four times a day (QID) | RESPIRATORY_TRACT | Status: DC | PRN

## 2012-05-18 NOTE — Patient Instructions (Addendum)
I will see you back in 2 weeks or so for your pap smear.  Your blood pressure is good. We won't start any meds for now.   We'll do the blood work next time. Come get blood work fasting before you see me.

## 2012-05-20 ENCOUNTER — Encounter: Payer: Self-pay | Admitting: Family Medicine

## 2012-05-20 DIAGNOSIS — Z6841 Body Mass Index (BMI) 40.0 and over, adult: Secondary | ICD-10-CM | POA: Insufficient documentation

## 2012-05-20 DIAGNOSIS — N898 Other specified noninflammatory disorders of vagina: Secondary | ICD-10-CM | POA: Insufficient documentation

## 2012-05-20 NOTE — Progress Notes (Signed)
Patient ID: Miata Culbreth, female   DOB: 03-Feb-1971, 41 y.o.   MRN: [id number] Patient ID: Johnnisha Forton    DOB: [birth year], 41 y.o.   MRN: [id number] --- Subjective:  Shalyn is a 41 y.o.female who presents to establish care. Current concerns include: -Yeast infection: Vaginal itching x2 days. No discharge, no odor, no burning. Took a bath and started having itching as a result. Sexually active with one partner. No dysuria, polyuria, hematuria. No abdominal pain, nausea, vomiting. - Mammogram: Had mammogram done of left breast a few years ago for evaluation of mass. Mass was thought to be benign and was recommended followup mammogram which she has not had recently. Denies any bumps or lumps. - High blood pressure: Has not taken blood pressure medicines in over a year monitors at [facility] and is generally 120 over 80s.  - Diet and exercise: Goes to the gym twice a week. This hasn't been a recent change. She wants to start becoming more active and healthy year. Would like to also start eating better.   ROS: see HPI Past Medical History: reviewed and updated medications and allergies. Social History: Tobacco: Occasional cigarettes. Although patient has stopped smoking for 8 months. Reports using cocaine, alcohol, marijuana which she stopped a month ago.  Objective: Filed Vitals:   05/18/12 1430  BP: 134/88  Pulse: 93    Physical Examination:   General appearance - alert, well appearing, and in no distress Chest - clear to auscultation, no wheezes, rales or rhonchi, symmetric air entry Heart - normal rate, regular rhythm, normal S1, S2, no murmurs, rubs, clicks or gallops Abdomen - soft, nontender, nondistended, no masses or organomegaly Extremities - peripheral pulses normal, no pedal edema, no clubbing or cyanosis

## 2012-05-20 NOTE — Assessment & Plan Note (Signed)
Will empirically treat with Diflucan 150 mg times one pill. Patient to return sooner for general Pap smear. Will also obtain at the time and STD screen. Patient agreeable to this. Patient to return sooner if prolonged vaginal itching, any new discharge, burning.

## 2012-05-20 NOTE — Assessment & Plan Note (Signed)
Will obtain future lipid panel with [facility]. Patient expresses interest in meeting with nutritionist. Will send referral as soon as patient has orange card.

## 2012-06-15 ENCOUNTER — Encounter: Payer: Self-pay | Admitting: Family Medicine

## 2012-09-02 ENCOUNTER — Emergency Department (HOSPITAL_COMMUNITY): Payer: Self-pay

## 2012-09-02 ENCOUNTER — Encounter (HOSPITAL_COMMUNITY): Payer: Self-pay | Admitting: *Deleted

## 2012-09-02 ENCOUNTER — Emergency Department (HOSPITAL_COMMUNITY)
Admission: EM | Admit: 2012-09-02 | Discharge: 2012-09-02 | Disposition: A | Discharge status: Home / Self Care | Payer: Self-pay | Attending: Emergency Medicine | Admitting: Emergency Medicine

## 2012-09-02 DIAGNOSIS — F1410 Cocaine abuse, uncomplicated: Secondary | ICD-10-CM | POA: Insufficient documentation

## 2012-09-02 DIAGNOSIS — J4489 Other specified chronic obstructive pulmonary disease: Secondary | ICD-10-CM | POA: Insufficient documentation

## 2012-09-02 DIAGNOSIS — Z8659 Personal history of other mental and behavioral disorders: Secondary | ICD-10-CM | POA: Insufficient documentation

## 2012-09-02 DIAGNOSIS — J3489 Other specified disorders of nose and nasal sinuses: Secondary | ICD-10-CM | POA: Insufficient documentation

## 2012-09-02 DIAGNOSIS — Z79899 Other long term (current) drug therapy: Secondary | ICD-10-CM | POA: Insufficient documentation

## 2012-09-02 DIAGNOSIS — J40 Bronchitis, not specified as acute or chronic: Secondary | ICD-10-CM | POA: Insufficient documentation

## 2012-09-02 DIAGNOSIS — F1010 Alcohol abuse, uncomplicated: Secondary | ICD-10-CM | POA: Insufficient documentation

## 2012-09-02 DIAGNOSIS — J45909 Unspecified asthma, uncomplicated: Secondary | ICD-10-CM | POA: Insufficient documentation

## 2012-09-02 DIAGNOSIS — Z87891 Personal history of nicotine dependence: Secondary | ICD-10-CM | POA: Insufficient documentation

## 2012-09-02 DIAGNOSIS — J449 Chronic obstructive pulmonary disease, unspecified: Secondary | ICD-10-CM | POA: Insufficient documentation

## 2012-09-02 DIAGNOSIS — F1210 Cannabis abuse, uncomplicated: Secondary | ICD-10-CM | POA: Insufficient documentation

## 2012-09-02 MED ORDER — GUAIFENESIN ER 1200 MG PO TB12: 1.0000 | ORAL_TABLET | Freq: Two times a day (BID) | ORAL | Status: DC

## 2012-09-02 MED ORDER — PREDNISONE 50 MG PO TABS: 50.0000 mg | ORAL_TABLET | Freq: Every day | ORAL | Status: DC

## 2012-09-02 MED ORDER — PROMETHAZINE HCL 6.25 MG/5ML PO SYRP
6.2500 mg | ORAL_SOLUTION | Freq: Once | ORAL | Status: AC
  Administered 2012-09-02: 6.25 mg via ORAL
  Filled 2012-09-02: qty 5

## 2012-09-02 MED ORDER — PROMETHAZINE-DM 6.25-15 MG/5ML PO SYRP: 5.0000 mL | ORAL_SOLUTION | Freq: Four times a day (QID) | ORAL | Status: DC | PRN

## 2012-09-02 MED ORDER — PREDNISONE 20 MG PO TABS
60.0000 mg | ORAL_TABLET | Freq: Once | ORAL | Status: AC
  Administered 2012-09-02: 60 mg via ORAL
  Filled 2012-09-02: qty 3

## 2012-09-02 MED ORDER — DOXYCYCLINE HYCLATE 100 MG PO CAPS: 100.0000 mg | ORAL_CAPSULE | Freq: Two times a day (BID) | ORAL | Status: DC

## 2012-09-02 MED ORDER — ALBUTEROL SULFATE HFA 108 (90 BASE) MCG/ACT IN AERS
2.0000 | INHALATION_SPRAY | Freq: Once | RESPIRATORY_TRACT | Status: AC
  Administered 2012-09-02: 2 via RESPIRATORY_TRACT
  Filled 2012-09-02: qty 6.7

## 2012-09-02 MED ORDER — IPRATROPIUM BROMIDE 0.02 % IN SOLN
0.5000 mg | Freq: Once | RESPIRATORY_TRACT | Status: AC
  Administered 2012-09-02: 0.5 mg via RESPIRATORY_TRACT
  Filled 2012-09-02: qty 2.5

## 2012-09-02 MED ORDER — ALBUTEROL SULFATE (5 MG/ML) 0.5% IN NEBU
5.0000 mg | INHALATION_SOLUTION | Freq: Once | RESPIRATORY_TRACT | Status: AC
  Administered 2012-09-02: 5 mg via RESPIRATORY_TRACT
  Filled 2012-09-02: qty 1

## 2012-09-02 NOTE — ED Provider Notes (Signed)
History     CSN: [id number]  Arrival date & time 09/02/12  1444   First MD Initiated Contact with Patient 09/02/12 1627      Chief Complaint  Patient presents with  . Cough    (Consider location/radiation/quality/duration/timing/severity/associated sxs/prior treatment) HPI The patient presents emergency Department a two-week history of cough, nasal congestion, and runny nose.  Patient, states that she has taken some over-the-counter medications without relief.  Patient denies chest pain, shortness of breath, headache, visual changes, fever, weakness, dizziness, abdominal pain, nausea, vomiting, or diarrhea.  Patient, states that she lays down at night her cough is worse.  Patient does have a history of asthma.  Patient, states that currently taking her medications due to cost.  Past Medical History  Diagnosis Date  . Substance abuse     cocaine, marijuana and alcohol abuse. quit end of 2012  . Asthma   . Depression   . COPD (chronic obstructive pulmonary disease)     Past Surgical History  Procedure Date  . Knee surgery     Family History  Problem Relation Age of Onset  . Hypertension Mother   . Heart disease Father     heart failure  . Diabetes Father     History  Substance Use Topics  . Smoking status: Former Smoker -- 0.2 packs/day    Types: Cigarettes    Quit date: 03/11/2012  . Smokeless tobacco: Never Used  . Alcohol Use: No    OB History    Grav Para Term Preterm Abortions TAB SAB Ect Mult Living                  Review of Systems All other systems negative except as documented in the HPI. All pertinent positives and negatives as reviewed in the HPI.  Allergies  Review of patient's allergies indicates no known allergies.  Home Medications   Current Outpatient Rx  Name  Route  Sig  Dispense  Refill  . ALBUTEROL SULFATE HFA 108 (90 BASE) MCG/ACT IN AERS   Inhalation   Inhale 2 puffs into the lungs every 6 (six) hours as needed for wheezing.   1  Inhaler   0   . ALBUTEROL SULFATE (2.5 MG/3ML) 0.083% IN NEBU   Nebulization   Take 2.5 mg by nebulization every 6 (six) hours as needed. For shortness of breath.         [name] DM-GUAIFENESIN ER 30-600 MG PO TB12   Oral   Take 1 tablet by mouth 2 (two) times daily as needed. For cough.         [name] DIPHENHYDRAMINE HCL 25 MG PO TABS   Oral   Take 25 mg by mouth every 6 (six) hours as needed. For cough.         . FLUOXETINE HCL 40 MG PO CAPS   Oral   Take 40 mg by mouth daily.         [name] ZOLPIDEM TARTRATE 10 MG PO TABS   Oral   Take 10 mg by mouth at bedtime as needed. For sleep.           BP 127/88  Pulse 91  Temp 98.6 F (37 C) (Oral)  Resp 20  SpO2 98%  LMP 08/27/2012  Physical Exam  Nursing note and vitals reviewed. Constitutional: She appears well-developed and well-nourished. No distress.  HENT:  Head: Normocephalic and atraumatic.  Mouth/Throat: Oropharynx is clear and moist.  Eyes: Conjunctivae normal are normal. Pupils are equal,  round, and reactive to light.  Neck: Normal range of motion. Neck supple.  Cardiovascular: Normal rate and regular rhythm.  Exam reveals no gallop and no friction rub.   No murmur heard. Pulmonary/Chest: Effort normal. No accessory muscle usage. Not tachypneic. No respiratory distress.       When patient takes a deep breath on exam, and she starts to cough, and has tight breath sounds.  Neurological: She is alert.  Skin: Skin is warm and dry. No rash noted.    ED Course  Procedures (including critical care time)  Labs Reviewed - No data to display Dg Chest 2 View  09/02/2012  *RADIOLOGY REPORT*  Clinical Data: 41 year old female shortness of breath.  CHEST - 2 VIEW  Comparison: 08/11/2011 and earlier.  Findings: Stable lung volumes.  Cardiac size and mediastinal contours are within normal limits.  Visualized tracheal air column is within normal limits.  No pneumothorax, pleural effusion or consolidation.  Mildly increased  interstitial markings compared to previous studies, might be technical artifact.  No other acute pulmonary opacity. No acute osseous abnormality identified.  IMPRESSION: Perhaps mildly increased interstitial markings such as due to viral or atypical respiratory infection.  Otherwise no acute cardiopulmonary abnormality.   Original Report Authenticated By: Erskine Speed, M.D.     Patient will be given albuterol, prednisone, cough suppressant and decongestant.  Patient is advised to return here for any worsening in her condition. Told to increase her fluids  MDM   MDM Reviewed: vitals, nursing note and previous chart Reviewed previous: labs and x-ray Interpretation: x-ray           Carlyle Dolly, PA-C 09/02/12 1751

## 2012-09-02 NOTE — ED Notes (Addendum)
Pt states she has had cough occasionally productive of white sputum x2 weeks.  Pt states she started having N/V with diarrhea 2 days ago.  Pt denies vomiting and diarrhea today.  Pt states vomiting may be r/t cough.  Pt also c/o fever as high as 103 last night.  Pt has been treating with Tylenol with some relief.  Pt states she wants something to treat her cough because she is scheduled to start a new job in the next week.

## 2012-09-02 NOTE — ED Provider Notes (Signed)
Medical screening examination/treatment/procedure(s) were performed by non-physician practitioner and as supervising physician I was immediately available for consultation/collaboration.  Zahava Quant, MD 09/02/12 1810 

## 2013-10-11 HISTORY — PX: BREAST BIOPSY: SHX20

## 2014-06-06 ENCOUNTER — Other Ambulatory Visit: Payer: Self-pay | Admitting: Obstetrics and Gynecology

## 2014-06-06 DIAGNOSIS — Z1231 Encounter for screening mammogram for malignant neoplasm of breast: Secondary | ICD-10-CM

## 2014-06-23 ENCOUNTER — Encounter (HOSPITAL_COMMUNITY): Payer: Self-pay | Admitting: Emergency Medicine

## 2014-06-23 ENCOUNTER — Emergency Department (HOSPITAL_COMMUNITY)
Admission: EM | Admit: 2014-06-23 | Discharge: 2014-06-23 | Disposition: A | Discharge status: Home / Self Care | Payer: Self-pay | Attending: Emergency Medicine | Admitting: Emergency Medicine

## 2014-06-23 ENCOUNTER — Emergency Department (HOSPITAL_COMMUNITY): Payer: Self-pay

## 2014-06-23 DIAGNOSIS — R112 Nausea with vomiting, unspecified: Secondary | ICD-10-CM | POA: Insufficient documentation

## 2014-06-23 DIAGNOSIS — R1084 Generalized abdominal pain: Secondary | ICD-10-CM | POA: Insufficient documentation

## 2014-06-23 DIAGNOSIS — IMO0002 Reserved for concepts with insufficient information to code with codable children: Secondary | ICD-10-CM | POA: Insufficient documentation

## 2014-06-23 DIAGNOSIS — F329 Major depressive disorder, single episode, unspecified: Secondary | ICD-10-CM | POA: Insufficient documentation

## 2014-06-23 DIAGNOSIS — Z87891 Personal history of nicotine dependence: Secondary | ICD-10-CM | POA: Insufficient documentation

## 2014-06-23 DIAGNOSIS — E669 Obesity, unspecified: Secondary | ICD-10-CM | POA: Insufficient documentation

## 2014-06-23 DIAGNOSIS — J449 Chronic obstructive pulmonary disease, unspecified: Secondary | ICD-10-CM | POA: Insufficient documentation

## 2014-06-23 DIAGNOSIS — Z792 Long term (current) use of antibiotics: Secondary | ICD-10-CM | POA: Insufficient documentation

## 2014-06-23 DIAGNOSIS — K589 Irritable bowel syndrome without diarrhea: Secondary | ICD-10-CM | POA: Insufficient documentation

## 2014-06-23 DIAGNOSIS — J4489 Other specified chronic obstructive pulmonary disease: Secondary | ICD-10-CM | POA: Insufficient documentation

## 2014-06-23 DIAGNOSIS — I10 Essential (primary) hypertension: Secondary | ICD-10-CM | POA: Insufficient documentation

## 2014-06-23 DIAGNOSIS — Z79899 Other long term (current) drug therapy: Secondary | ICD-10-CM | POA: Insufficient documentation

## 2014-06-23 DIAGNOSIS — F3289 Other specified depressive episodes: Secondary | ICD-10-CM | POA: Insufficient documentation

## 2014-06-23 DIAGNOSIS — R1010 Upper abdominal pain, unspecified: Secondary | ICD-10-CM

## 2014-06-23 DIAGNOSIS — Z3202 Encounter for pregnancy test, result negative: Secondary | ICD-10-CM | POA: Insufficient documentation

## 2014-06-23 HISTORY — DX: Obesity, unspecified: E66.9

## 2014-06-23 HISTORY — DX: Essential (primary) hypertension: I10

## 2014-06-23 LAB — CBC WITH DIFFERENTIAL/PLATELET
BASOS ABS: 0.1 10*3/uL (ref 0.0–0.1)
Basophils Relative: 1 % (ref 0–1)
EOS ABS: 0.2 10*3/uL (ref 0.0–0.7)
EOS PCT: 2 % (ref 0–5)
HEMATOCRIT: 39.9 % (ref 36.0–46.0)
Hemoglobin: 13.4 g/dL (ref 12.0–15.0)
LYMPHS PCT: 41 % (ref 12–46)
Lymphs Abs: 3.6 10*3/uL (ref 0.7–4.0)
MCH: 29.6 pg (ref 26.0–34.0)
MCHC: 33.6 g/dL (ref 30.0–36.0)
MCV: 88.1 fL (ref 78.0–100.0)
MONO ABS: 0.4 10*3/uL (ref 0.1–1.0)
Monocytes Relative: 4 % (ref 3–12)
Neutro Abs: 4.6 10*3/uL (ref 1.7–7.7)
Neutrophils Relative %: 52 % (ref 43–77)
PLATELETS: 400 10*3/uL (ref 150–400)
RBC: 4.53 MIL/uL (ref 3.87–5.11)
RDW: 13.7 % (ref 11.5–15.5)
WBC: 8.8 10*3/uL (ref 4.0–10.5)

## 2014-06-23 LAB — URINALYSIS, ROUTINE W REFLEX MICROSCOPIC
BILIRUBIN URINE: NEGATIVE
Glucose, UA: NEGATIVE mg/dL
KETONES UR: NEGATIVE mg/dL
Leukocytes, UA: NEGATIVE
NITRITE: NEGATIVE
Protein, ur: NEGATIVE mg/dL
SPECIFIC GRAVITY, URINE: 1.02 (ref 1.005–1.030)
UROBILINOGEN UA: 0.2 mg/dL (ref 0.0–1.0)
pH: 5 (ref 5.0–8.0)

## 2014-06-23 LAB — COMPREHENSIVE METABOLIC PANEL
ALBUMIN: 3.7 g/dL (ref 3.5–5.2)
ALK PHOS: 95 U/L (ref 39–117)
ALT: 14 U/L (ref 0–35)
AST: 13 U/L (ref 0–37)
Anion gap: 15 (ref 5–15)
BUN: 10 mg/dL (ref 6–23)
CHLORIDE: 98 meq/L (ref 96–112)
CO2: 22 meq/L (ref 19–32)
CREATININE: 0.79 mg/dL (ref 0.50–1.10)
Calcium: 9.6 mg/dL (ref 8.4–10.5)
GFR calc Af Amer: >90 mL/min (ref >90)
Glucose, Bld: 144 mg/dL — ABNORMAL HIGH (ref 70–99)
POTASSIUM: 4.3 meq/L (ref 3.7–5.3)
Sodium: 135 mEq/L — ABNORMAL LOW (ref 137–147)
Total Protein: 7.1 g/dL (ref 6.0–8.3)

## 2014-06-23 LAB — URINE MICROSCOPIC-ADD ON

## 2014-06-23 LAB — POC URINE PREG, ED: PREG TEST UR: NEGATIVE

## 2014-06-23 LAB — LIPASE, BLOOD: LIPASE: 21 U/L (ref 11–59)

## 2014-06-23 MED ORDER — OXYCODONE-ACETAMINOPHEN 5-325 MG PO TABS
1.0000 | ORAL_TABLET | Freq: Once | ORAL | Status: AC
  Administered 2014-06-23: 1 via ORAL
  Filled 2014-06-23: qty 1

## 2014-06-23 MED ORDER — HYDROCODONE-ACETAMINOPHEN 5-325 MG PO TABS: 2.0000 | ORAL_TABLET | ORAL | Status: DC | PRN

## 2014-06-23 MED ORDER — MORPHINE SULFATE 4 MG/ML IJ SOLN
6.0000 mg | Freq: Once | INTRAMUSCULAR | Status: DC
  Filled 2014-06-23: qty 2

## 2014-06-23 MED ORDER — ONDANSETRON HCL 4 MG/2ML IJ SOLN
4.0000 mg | Freq: Once | INTRAMUSCULAR | Status: DC
  Filled 2014-06-23: qty 2

## 2014-06-23 MED ORDER — OMEPRAZOLE 20 MG PO CPDR: 20.0000 mg | DELAYED_RELEASE_CAPSULE | Freq: Every day | ORAL | Status: DC

## 2014-06-23 NOTE — Discharge Instructions (Signed)
Follow up with your doctor for further evaluation of your pain.  Return if worsen.    Abdominal Pain, Women Abdominal (stomach, pelvic, or belly) pain can be caused by many things. It is important to tell your doctor:  The location of the pain.  Does it come and go or is it present all the time?  Are there things that start the pain (eating certain foods, exercise)?  Are there other symptoms associated with the pain (fever, nausea, vomiting, diarrhea)? All of this is helpful to know when trying to find the cause of the pain. CAUSES   Stomach: virus or bacteria infection, or ulcer.  Intestine: appendicitis (inflamed appendix), regional ileitis (Crohn's disease), ulcerative colitis (inflamed colon), irritable bowel syndrome, diverticulitis (inflamed diverticulum of the colon), or cancer of the stomach or intestine.  Gallbladder disease or stones in the gallbladder.  Kidney disease, kidney stones, or infection.  Pancreas infection or cancer.  Fibromyalgia (pain disorder).  Diseases of the female organs:  Uterus: fibroid (non-cancerous) tumors or infection.  Fallopian tubes: infection or tubal pregnancy.  Ovary: cysts or tumors.  Pelvic adhesions (scar tissue).  Endometriosis (uterus lining tissue growing in the pelvis and on the pelvic organs).  Pelvic congestion syndrome (female organs filling up with blood just before the menstrual period).  Pain with the menstrual period.  Pain with ovulation (producing an egg).  Pain with an IUD (intrauterine device, birth control) in the uterus.  Cancer of the female organs.  Functional pain (pain not caused by a disease, may improve without treatment).  Psychological pain.  Depression. DIAGNOSIS  Your doctor will decide the seriousness of your pain by doing an examination.  Blood tests.  X-rays.  Ultrasound.  CT scan (computed tomography, special type of X-ray).  MRI (magnetic resonance imaging).  Cultures, for  infection.  Barium enema (dye inserted in the large intestine, to better view it with X-rays).  Colonoscopy (looking in intestine with a lighted tube).  Laparoscopy (minor surgery, looking in abdomen with a lighted tube).  Major abdominal exploratory surgery (looking in abdomen with a large incision). TREATMENT  The treatment will depend on the cause of the pain.   Many cases can be observed and treated at home.  Over-the-counter medicines recommended by your caregiver.  Prescription medicine.  Antibiotics, for infection.  Birth control pills, for painful periods or for ovulation pain.  Hormone treatment, for endometriosis.  Nerve blocking injections.  Physical therapy.  Antidepressants.  Counseling with a psychologist or psychiatrist.  Minor or major surgery. HOME CARE INSTRUCTIONS   Do not take laxatives, unless directed by your caregiver.  Take over-the-counter pain medicine only if ordered by your caregiver. Do not take aspirin because it can cause an upset stomach or bleeding.  Try a clear liquid diet (broth or water) as ordered by your caregiver. Slowly move to a bland diet, as tolerated, if the pain is related to the stomach or intestine.  Have a thermometer and take your temperature several times a day, and record it.  Bed rest and sleep, if it helps the pain.  Avoid sexual intercourse, if it causes pain.  Avoid stressful situations.  Keep your follow-up appointments and tests, as your caregiver orders.  If the pain does not go away with medicine or surgery, you may try:  Acupuncture.  Relaxation exercises (yoga, meditation).  Group therapy.  Counseling. SEEK MEDICAL CARE IF:   You notice certain foods cause stomach pain.  Your home care treatment is not helping your  pain.  You need stronger pain medicine.  You want your IUD removed.  You feel faint or lightheaded.  You develop nausea and vomiting.  You develop a rash.  You are  having side effects or an allergy to your medicine. SEEK IMMEDIATE MEDICAL CARE IF:   Your pain does not go away or gets worse.  You have a fever.  Your pain is felt only in portions of the abdomen. The right side could possibly be appendicitis. The left lower portion of the abdomen could be colitis or diverticulitis.  You are passing blood in your stools (bright red or black tarry stools, with or without vomiting).  You have blood in your urine.  You develop chills, with or without a fever.  You pass out. MAKE SURE YOU:   Understand these instructions.  Will watch your condition.  Will get help right away if you are not doing well or get worse. Document Released: 07/25/2007 Document Revised: 02/11/2014 Document Reviewed: 08/14/2009 [facility] Patient Information 2015 [place], [place]. This information is not intended to replace advice given to you by your health care provider. Make sure you discuss any questions you have with your health care provider.

## 2014-06-23 NOTE — ED Notes (Signed)
Pt refused discharge vitals 

## 2014-06-23 NOTE — ED Provider Notes (Signed)
Medical screening examination/treatment/procedure(s) were performed by non-physician practitioner and as supervising physician I was immediately available for consultation/collaboration.   EKG Interpretation None        Evelina Bucy, MD 06/23/14 [phone]

## 2014-06-23 NOTE — ED Notes (Signed)
Pt refused IV; reports having small veins and would like "medication by mouth"

## 2014-06-23 NOTE — ED Provider Notes (Signed)
CSN: [id number]     Arrival date & time 06/23/14  1653 History   First MD Initiated Contact with Patient 06/23/14 1755     Chief Complaint  Patient presents with  . Abdominal Pain     (Consider location/radiation/quality/duration/timing/severity/associated sxs/prior Treatment) HPI  43 year old female with history of IBS, obese, COPD, substance abuse presents complaining of abdominal pain. Patient reports intermittent sharp upper abdomen pain for the past 6 months. Pain usually lasting for a few minutes and resolved. Pain usually brought on by eating. Pain associated with nausea and occasional vomiting. Last night her pain started and has been persistent throughout the day today. She rates her pain as 8/10, nonradiating, mildly improved with taking Advil. Reports chills but denies fever, chest pain, shortness of breath, productive cough, back pain, dysuria, hematuria, hematochezia or melena. She reports having her gall stones surgically removed many years ago but still has an intact gallbladder. She denies any recent street drug use, or alcohol use. No history of diabetes.  Past Medical History  Diagnosis Date  . Substance abuse     cocaine, marijuana and alcohol abuse. quit end of 2012  . Asthma   . Depression   . COPD (chronic obstructive pulmonary disease)   . IBS (irritable bowel syndrome)   . Obesity   . Hypertension    Past Surgical History  Procedure Laterality Date  . Knee surgery     Family History  Problem Relation Age of Onset  . Hypertension Mother   . Heart disease Father     heart failure  . Diabetes Father    History  Substance Use Topics  . Smoking status: Former Smoker -- 0.25 packs/day    Types: Cigarettes    Quit date: 03/11/2012  . Smokeless tobacco: Never Used  . Alcohol Use: No   OB History   Grav Para Term Preterm Abortions TAB SAB Ect Mult Living                 Review of Systems  All other systems reviewed and are negative.     Allergies   Lisinopril  Home Medications   Prior to Admission medications   Medication Sig Start Date End Date Taking? Authorizing Provider  albuterol (PROVENTIL HFA;VENTOLIN HFA) 108 (90 BASE) MCG/ACT inhaler Inhale 2 puffs into the lungs every 6 (six) hours as needed for wheezing. 05/18/12 05/18/13  Kandis Nab, MD  albuterol (PROVENTIL) (2.5 MG/3ML) 0.083% nebulizer solution Take 2.5 mg by nebulization every 6 (six) hours as needed. For shortness of breath.    Historical Provider, MD  dextromethorphan-guaiFENesin (MUCINEX DM) 30-600 MG per 12 hr tablet Take 1 tablet by mouth 2 (two) times daily as needed. For cough.    Historical Provider, MD  diphenhydrAMINE (BENADRYL) 25 MG tablet Take 25 mg by mouth every 6 (six) hours as needed. For cough.    Historical Provider, MD  doxycycline (VIBRAMYCIN) 100 MG capsule Take 1 capsule (100 mg total) by mouth 2 (two) times daily. 09/02/12   Resa Miner Lawyer, PA-C  FLUoxetine (PROZAC) 40 MG capsule Take 40 mg by mouth daily.    Historical Provider, MD  Guaifenesin 1200 MG TB12 Take 1 tablet (1,200 mg total) by mouth 2 (two) times daily. 09/02/12   Resa Miner Lawyer, PA-C  predniSONE (DELTASONE) 50 MG tablet Take 1 tablet (50 mg total) by mouth daily. 09/02/12   [place], PA-C  promethazine-dextromethorphan (PROMETHAZINE-DM) 6.25-15 MG/5ML syrup Take 5 mLs by mouth 4 (four) times daily  as needed for cough. 09/02/12   Resa Miner Lawyer, PA-C  zolpidem (AMBIEN) 10 MG tablet Take 10 mg by mouth at bedtime as needed. For sleep.    Historical Provider, MD   BP 151/106  Pulse 77  Temp(Src) 97.7 F (36.5 C) (Oral)  Resp 22  Ht 5' 3.5" (1.613 m)  Wt 315 lb (142.883 kg)  BMI 54.92 kg/m2  SpO2 97%  LMP 06/16/2014 Physical Exam  Nursing note and vitals reviewed. Constitutional: She is oriented to person, place, and time. She appears well-developed and well-nourished. No distress.  Morbidly obese African American female appears to be in no acute  distress.  HENT:  Head: Atraumatic.  Eyes: Conjunctivae are normal.  Neck: Neck supple.  Cardiovascular: Normal rate and regular rhythm.   Pulmonary/Chest: Effort normal and breath sounds normal.  Abdominal: Soft. Bowel sounds are normal. There is tenderness (Diffuse abdominal tenderness most significant to right upper quadrant on palpation. Exam is difficult due to large body habitus.).  Neurological: She is alert and oriented to person, place, and time.  Skin: No rash noted.  Psychiatric: She has a normal mood and affect.    ED Course  Procedures (including critical care time)  6:37 PM Patient here with upper abdominal pain suggestive of biliary colic. Workup initiated, pain medication given.  11:12 PM Pregnancy test is negative, urine without evidence of urinary tract infection, lipase is normal, electrolytes all reassuring, normal WBC, and abdominal ultrasound shows an absent gallbladder without any acute pathology. Patient symptom has improved but not fully resolved. However doubt acute emergent condition given the available lab results.  Recommend close f/u with PCP for further care, return precaution discussed.    Labs Review Labs Reviewed  COMPREHENSIVE METABOLIC PANEL - Abnormal; Notable for the following:    Sodium 135 (*)    Glucose, Bld 144 (*)    Total Bilirubin <0.2 (*)    All other components within normal limits  URINALYSIS, ROUTINE W REFLEX MICROSCOPIC - Abnormal; Notable for the following:    APPearance CLOUDY (*)    Hgb urine dipstick TRACE (*)    All other components within normal limits  URINE MICROSCOPIC-ADD ON - Abnormal; Notable for the following:    Squamous Epithelial / LPF FEW (*)    Bacteria, UA FEW (*)    All other components within normal limits  LIPASE, BLOOD  CBC WITH DIFFERENTIAL  POC URINE PREG, ED    Imaging Review US Abdomen Complete  06/23/2014   CLINICAL DATA:  Right upper quadrant pain with nausea and vomiting.  EXAM: ULTRASOUND  ABDOMEN COMPLETE  COMPARISON:  CT scan abdomen dated 06/11/2008  FINDINGS: Gallbladder:  Removed.  Common bile duct:  Diameter: 3.5 mm, normal.  Liver:  No focal lesions. Slight increased echogenicity consistent with hepatic steatosis.  IVC:  Normal.  Pancreas:  Normal.  Spleen:  Normal.  12.7 cm in length.  Right Kidney:  Length: 11.7 cm. Echogenicity within normal limits. No mass or hydronephrosis visualized.  Left Kidney:  Length: 11.1 cm. Echogenicity within normal limits. No mass or hydronephrosis visualized.  Abdominal aorta:  Normal.  2.2 cm maximum diameter.  Other findings:  None.  IMPRESSION: No acute abnormality.  Hepatic steatosis.   Electronically Signed   By: Rozetta Nunnery M.D.   On: 06/23/2014 22:01     EKG Interpretation None      Date: 06/23/2014  Rate: 78  Rhythm: normal sinus rhythm  QRS Axis: normal  Intervals: normal  ST/T Wave  abnormalities: normal  Conduction Disutrbances: none  Narrative Interpretation:   Old EKG Reviewed: No significant changes noted     MDM   Final diagnoses:  Upper abdominal pain    BP 144/84  Pulse 78  Temp(Src) 98.1 F (36.7 C) (Oral)  Resp 18  Ht 5' 3.5" (1.613 m)  Wt 315 lb (142.883 kg)  BMI 54.92 kg/m2  SpO2 100%  LMP 06/16/2014  I have reviewed nursing notes and vital signs. I personally reviewed the imaging tests through PACS system  I reviewed available ER/hospitalization records thought the EMR     Domenic Moras, [place] 06/23/14 2314

## 2014-06-23 NOTE — ED Notes (Signed)
Reports intermittent sharp abd pains for months, have become more frequent. Reports they started on right side of abd and shift to other areas. Had some nausea, but no vomiting or diarrhea. Denies any vaginal or urinary symptoms. Does reports recent recent stress and episodes of dizziness. No acute distress noted at triage.

## 2014-06-23 NOTE — ED Notes (Signed)
Pt knows that urine is needed. Pt void before coming to the room pt has a urine cup at bedside and stated that she would let us know when she has to void

## 2014-07-02 ENCOUNTER — Ambulatory Visit (HOSPITAL_COMMUNITY): Payer: Self-pay

## 2014-07-09 ENCOUNTER — Ambulatory Visit (HOSPITAL_COMMUNITY)
Admission: RE | Admit: 2014-07-09 | Discharge: 2014-07-09 | Disposition: A | Discharge status: Home / Self Care | Payer: Self-pay | Source: Ambulatory Visit | Attending: Obstetrics and Gynecology | Admitting: Obstetrics and Gynecology

## 2014-07-09 ENCOUNTER — Encounter (HOSPITAL_COMMUNITY): Payer: Self-pay

## 2014-07-09 VITALS — BP 120/84 | Temp 98.6°F | Ht 63.0 in | Wt 333.0 lb

## 2014-07-09 DIAGNOSIS — Z1231 Encounter for screening mammogram for malignant neoplasm of breast: Secondary | ICD-10-CM

## 2014-07-09 DIAGNOSIS — Z1239 Encounter for other screening for malignant neoplasm of breast: Secondary | ICD-10-CM

## 2014-07-09 NOTE — Progress Notes (Signed)
No complaints today.  Pap Smear:  Pap smear not completed today. Last Pap smear was 05/07/2009 and normal. Per patient has a history of an abnormal Pap smear in the 1990's that required cryotherapy for follow-up. Patient on menstrual cycle today. Patient informed to call Gabriel Cirri to schedule Pap smear in [place] [facility]. Last Pap smear result is in EPIC.  Physical exam: Breasts Breasts symmetrical. No skin abnormalities bilateral breasts. No nipple retraction bilateral breasts. No nipple discharge bilateral breasts. No lymphadenopathy. No lumps palpated bilateral breasts. No complaints of pain or tenderness on exam. Patient escorted to mammography for a screening mammogram.        Pelvic/Bimanual No Pap smear completed today since patient is on menstrual cycle. Patient will call to schedule.

## 2014-07-09 NOTE — Addendum Note (Signed)
Encounter addended by: Loletta Parish, RN on: 07/09/2014 12:38 PM<BR>     Documentation filed: Visit Diagnoses, Charges VN

## 2014-07-09 NOTE — Patient Instructions (Signed)
Explained to  [name] that she needs to have a Pap smear completed. Patient is on menstrual period today and will call [name] to schedule Pap smear with [facility]. Let her know [facility] will cover Pap smears every 3 years unless has a history of abnormal Pap smears. Let patient know the [facility] will follow up with her within the next couple weeks with results by letter or phone. Joanna Newman verbalized understanding. Patient escorted to mammography for a screening mammogram.  [name], Arvil Chaco, RN 11:55 AM

## 2014-07-15 ENCOUNTER — Other Ambulatory Visit: Payer: Self-pay | Admitting: Obstetrics and Gynecology

## 2014-07-15 DIAGNOSIS — R928 Other abnormal and inconclusive findings on diagnostic imaging of breast: Secondary | ICD-10-CM

## 2014-07-26 ENCOUNTER — Other Ambulatory Visit: Payer: Self-pay | Admitting: Obstetrics and Gynecology

## 2014-07-26 DIAGNOSIS — R928 Other abnormal and inconclusive findings on diagnostic imaging of breast: Secondary | ICD-10-CM

## 2014-07-26 DIAGNOSIS — N6489 Other specified disorders of breast: Secondary | ICD-10-CM

## 2014-07-30 ENCOUNTER — Other Ambulatory Visit: Payer: Self-pay | Admitting: Obstetrics and Gynecology

## 2014-07-31 ENCOUNTER — Ambulatory Visit
Admission: RE | Admit: 2014-07-31 | Discharge: 2014-07-31 | Disposition: A | Discharge status: Home / Self Care | Payer: No Typology Code available for payment source | Source: Ambulatory Visit | Attending: Obstetrics and Gynecology | Admitting: Obstetrics and Gynecology

## 2014-07-31 ENCOUNTER — Other Ambulatory Visit: Payer: Self-pay | Admitting: Obstetrics and Gynecology

## 2014-07-31 DIAGNOSIS — R928 Other abnormal and inconclusive findings on diagnostic imaging of breast: Secondary | ICD-10-CM

## 2014-07-31 DIAGNOSIS — N6489 Other specified disorders of breast: Secondary | ICD-10-CM

## 2014-08-05 ENCOUNTER — Other Ambulatory Visit: Payer: Self-pay | Admitting: Obstetrics and Gynecology

## 2014-08-05 DIAGNOSIS — R928 Other abnormal and inconclusive findings on diagnostic imaging of breast: Secondary | ICD-10-CM

## 2014-08-05 DIAGNOSIS — N6489 Other specified disorders of breast: Secondary | ICD-10-CM

## 2014-08-09 ENCOUNTER — Inpatient Hospital Stay: Admission: RE | Admit: 2014-08-09 | Payer: Self-pay | Source: Ambulatory Visit

## 2014-08-12 ENCOUNTER — Encounter (HOSPITAL_COMMUNITY): Payer: Self-pay

## 2014-08-20 ENCOUNTER — Ambulatory Visit
Admission: RE | Admit: 2014-08-20 | Discharge: 2014-08-20 | Disposition: A | Discharge status: Home / Self Care | Payer: No Typology Code available for payment source | Source: Ambulatory Visit | Attending: Obstetrics and Gynecology | Admitting: Obstetrics and Gynecology

## 2014-08-20 DIAGNOSIS — R928 Other abnormal and inconclusive findings on diagnostic imaging of breast: Secondary | ICD-10-CM

## 2014-08-20 DIAGNOSIS — N6489 Other specified disorders of breast: Secondary | ICD-10-CM

## 2014-09-18 ENCOUNTER — Encounter (HOSPITAL_COMMUNITY): Payer: Self-pay

## 2014-09-18 ENCOUNTER — Ambulatory Visit (HOSPITAL_COMMUNITY)
Admission: RE | Admit: 2014-09-18 | Discharge: 2014-09-18 | Disposition: A | Discharge status: Home / Self Care | Payer: Self-pay | Source: Ambulatory Visit | Attending: Obstetrics and Gynecology | Admitting: Obstetrics and Gynecology

## 2014-09-18 VITALS — BP 124/76 | Temp 98.5°F | Ht 63.0 in | Wt 338.2 lb

## 2014-09-18 DIAGNOSIS — Z01419 Encounter for gynecological examination (general) (routine) without abnormal findings: Secondary | ICD-10-CM

## 2014-09-18 NOTE — Progress Notes (Signed)
Complaints of AUB and history of fibroids.   Pap Smear:  Pap smear completed today. Last Pap smear was 05/07/2009 and normal. Per patient has a history of an abnormal Pap smear in the 1990's that required cryotherapy for follow-up. Last Pap smear result is in EPIC.     Pelvic/Bimanual   Ext Genitalia No lesions, no swelling and no discharge observed on external genitalia.         Vagina Vagina pink and normal texture. No lesions or discharge observed in vagina.          Cervix Cervix is present. Cervix pink and of normal texture. No discharge observed.     Uterus Uterus is present and palpable. Uterus in normal position and normal size.        Adnexae Bilateral ovaries present and unable to palpate. No tenderness on palpation.          Rectovaginal No rectal exam completed today since patient had no rectal complaints. No skin abnormalities observed on exam.

## 2014-09-18 NOTE — Patient Instructions (Signed)
Explained to Joanna Newman that [facility] will cover Pap smears and co-testing every 5 years unless has a history of abnormal Pap smears. Let patient know will follow up with her within the next couple weeks with results of Pap smear by phone. Told patient will refer her to the [facility] to follow-up for AUB after get results to Pap smear. Joanna Newman verbalized understanding.

## 2014-09-19 ENCOUNTER — Encounter: Payer: Self-pay | Admitting: Obstetrics & Gynecology

## 2014-09-20 LAB — CYTOLOGY - PAP

## 2014-09-30 ENCOUNTER — Telehealth (HOSPITAL_COMMUNITY): Payer: Self-pay | Admitting: *Deleted

## 2014-09-30 NOTE — Telephone Encounter (Signed)
Telephoned patient at home # and discussed negative pap smear results. Also dicussed negative HPV results. Next pap smear due in 5 years. Patient voiced understanding.

## 2014-10-30 ENCOUNTER — Encounter: Payer: Self-pay | Admitting: Obstetrics & Gynecology

## 2014-10-30 ENCOUNTER — Ambulatory Visit (INDEPENDENT_AMBULATORY_CARE_PROVIDER_SITE_OTHER): Payer: Self-pay | Admitting: Obstetrics & Gynecology

## 2014-10-30 VITALS — BP 145/87 | HR 97 | Temp 97.4°F | Ht 63.0 in | Wt 326.1 lb

## 2014-10-30 DIAGNOSIS — N921 Excessive and frequent menstruation with irregular cycle: Secondary | ICD-10-CM

## 2014-10-30 DIAGNOSIS — N898 Other specified noninflammatory disorders of vagina: Secondary | ICD-10-CM

## 2014-10-30 DIAGNOSIS — Z01812 Encounter for preprocedural laboratory examination: Secondary | ICD-10-CM

## 2014-10-30 LAB — CBC
HEMATOCRIT: 45.6 % (ref 36.0–46.0)
Hemoglobin: 14.5 g/dL (ref 12.0–15.0)
MCH: 29.2 pg (ref 26.0–34.0)
MCHC: 31.8 g/dL (ref 30.0–36.0)
MCV: 91.9 fL (ref 78.0–100.0)
MPV: 11.7 fL (ref 8.6–12.4)
PLATELETS: 456 10*3/uL — AB (ref 150–400)
RBC: 4.96 MIL/uL (ref 3.87–5.11)
RDW: 13.7 % (ref 11.5–15.5)
WBC: 8.8 10*3/uL (ref 4.0–10.5)

## 2014-10-30 LAB — TSH: TSH: 2.021 u[IU]/mL (ref 0.350–4.500)

## 2014-10-30 LAB — POCT PREGNANCY, URINE: PREG TEST UR: NEGATIVE

## 2014-10-30 NOTE — Progress Notes (Signed)
Subjective:     Patient ID: Joanna Newman, female   DOB: 1971/07/14, 44 y.o.   MRN: [id number]  HPI Pt report irreg menses.  She reports that her cycles that are irreg.  She reports that her cycles started to change in Aug after her mother passed.  She had a cycle in Aug then again in Oct but, was very light.  Then she had a cycles in Dec that lasted 10 days total.  She has not had a heavy cycles since Dec.  Her normal cycle is 7-10 days monthly and has been very heavy.  No bleeding between cycles.  Pap was normal Dec 2015.  Pt is not sexually active.    Pt reports that she has a foul or 'sour' smell after eating certain foods.    Past Medical History  Diagnosis Date  . Substance abuse     cocaine, marijuana and alcohol abuse. quit end of 2012  . Asthma   . Depression   . COPD (chronic obstructive pulmonary disease)   . IBS (irritable bowel syndrome)   . Obesity   . Hypertension    Past Surgical History  Procedure Laterality Date  . Knee surgery    . Tubal ligation     Current Outpatient Prescriptions on File Prior to Visit  Medication Sig Dispense Refill  . albuterol (PROVENTIL) (2.5 MG/3ML) 0.083% nebulizer solution Take 2.5 mg by nebulization every 6 (six) hours as needed for wheezing or shortness of breath.     . diphenhydrAMINE (BENADRYL) 25 MG tablet Take 25 mg by mouth every 6 (six) hours as needed for allergies.     . hydrochlorothiazide (MICROZIDE) 12.5 MG capsule Take 12.5 mg by mouth daily.    . metoprolol (LOPRESSOR) 50 MG tablet Take 50 mg by mouth 2 (two) times daily.    . ranitidine (ZANTAC) 150 MG tablet Take 150 mg by mouth 3 (three) times daily.    [name] albuterol (PROVENTIL HFA;VENTOLIN HFA) 108 (90 BASE) MCG/ACT inhaler Inhale 2 puffs into the lungs every 6 (six) hours as needed for wheezing. 1 Inhaler 0  . HYDROcodone-acetaminophen (NORCO/VICODIN) 5-325 MG per tablet Take 2 tablets by mouth every 4 (four) hours as needed for moderate pain or severe pain. (Patient not  taking: Reported on 09/18/2014) 6 tablet 0  . omeprazole (PRILOSEC) 20 MG capsule Take 1 capsule (20 mg total) by mouth daily. (Patient not taking: Reported on 09/18/2014) 30 capsule 0  . traZODone (DESYREL) 50 MG tablet Take 100 mg by mouth at bedtime.     No current facility-administered medications on file prior to visit.   Allergies  Allergen Reactions  . Lisinopril Swelling       Review of Systems     Objective:   Physical Exam BP 145/87 mmHg  Pulse 97  Temp(Src) 97.4 F (36.3 C) (Oral)  Ht 5\' 3"  (1.6 m)  Wt 326 lb 1.6 oz (147.918 kg)  BMI 57.78 kg/m2  LMP 08/12/2014 Abd: soft, NT; ND.  GU: EGBUS: no lesions Vagina: no blood in vault Cervix: no lesion; no mucopurulent d/c Uterus: difficult to assess due body habitus Adnexa: no masses; non tender      Assessment:     AUB- with one prolonged cycle.  Pt does not want meds at present  eval for BV    Plan:     Lab: CBC and TSH Pelvic sono F/u in 3 months  F/u wet mount

## 2014-10-30 NOTE — Patient Instructions (Signed)
Dysfunctional Uterine Bleeding Normally, menstrual periods begin between ages 11 to 17 in young women. A normal menstrual cycle/period may begin every 23 days up to 35 days and lasts from 1 to 7 days. Around 12 to 14 days before your menstrual period starts, ovulation (ovary produces an egg) occurs. When counting the time between menstrual periods, count from the first day of bleeding of the previous period to the first day of bleeding of the next period. Dysfunctional (abnormal) uterine bleeding is bleeding that is different from a normal menstrual period. Your periods may come earlier or later than usual. They may be lighter, have blood clots or be heavier. You may have bleeding between periods, or you may skip one period or more. You may have bleeding after sexual intercourse, bleeding after menopause, or no menstrual period. CAUSES   Pregnancy (normal, miscarriage, tubal).  IUDs (intrauterine device, birth control).  Birth control pills.  Hormone treatment.  Menopause.  Infection of the cervix.  Blood clotting problems.  Infection of the inside lining of the uterus.  Endometriosis, inside lining of the uterus growing in the pelvis and other female organs.  Adhesions (scar tissue) inside the uterus.  Obesity or severe weight loss.  Uterine polyps inside the uterus.  Cancer of the vagina, cervix, or uterus.  Ovarian cysts or polycystic ovary syndrome.  Medical problems (diabetes, thyroid disease).  Uterine fibroids (noncancerous tumor).  Problems with your female hormones.  Endometrial hyperplasia, very thick lining and enlarged cells inside of the uterus.  Medicines that interfere with ovulation.  Radiation to the pelvis or abdomen.  Chemotherapy. DIAGNOSIS   Your doctor will discuss the history of your menstrual periods, medicines you are taking, changes in your weight, stress in your life, and any medical problems you may have.  Your doctor will do a physical  and pelvic examination.  Your doctor may want to perform certain tests to make a diagnosis, such as:  Pap test.  Blood tests.  Cultures for infection.  CT scan.  Ultrasound.  Hysteroscopy.  Laparoscopy.  MRI.  Hysterosalpingography.  D and C.  Endometrial biopsy. TREATMENT  Treatment will depend on the cause of the dysfunctional uterine bleeding (DUB). Treatment may include:  Observing your menstrual periods for a couple of months.  Prescribing medicines for medical problems, including:  Antibiotics.  Hormones.  Birth control pills.  Removing an IUD (intrauterine device, birth control).  Surgery:  D and C (scrape and remove tissue from inside the uterus).  Laparoscopy (examine inside the abdomen with a lighted tube).  Uterine ablation (destroy lining of the uterus with electrical current, laser, heat, or freezing).  Hysteroscopy (examine cervix and uterus with a lighted tube).  Hysterectomy (remove the uterus). HOME CARE INSTRUCTIONS   If medicines were prescribed, take exactly as directed. Do not change or switch medicines without consulting your caregiver.  Long term heavy bleeding may result in iron deficiency. Your caregiver may have prescribed iron pills. They help replace the iron that your body lost from heavy bleeding. Take exactly as directed.  Do not take aspirin or medicines that contain aspirin one week before or during your menstrual period. Aspirin may make the bleeding worse.  If you need to change your sanitary pad or tampon more than once every 2 hours, stay in bed with your feet elevated and a cold pack on your lower abdomen. Rest as much as possible, until the bleeding stops or slows down.  Eat well-balanced meals. Eat foods high in iron. Examples   are:  Leafy green vegetables.  Whole-grain breads and cereals.  Eggs.  Meat.  Liver.  Do not try to lose weight until the abnormal bleeding has stopped and your blood iron level is  back to normal. Do not lift more than ten pounds or do strenuous activities when you are bleeding.  For a couple of months, make note on your calendar, marking the start and ending of your period, and the type of bleeding (light, medium, heavy, spotting, clots or missed periods). This is for your caregiver to better evaluate your problem. SEEK MEDICAL CARE IF:   You develop nausea (feeling sick to your stomach) and vomiting, dizziness, or diarrhea while you are taking your medicine.  You are getting lightheaded or weak.  You have any problems that may be related to the medicine you are taking.  You develop pain with your DUB.  You want to remove your IUD.  You want to stop or change your birth control pills or hormones.  You have any type of abnormal bleeding mentioned above.  You are over 16 years old and have not had a menstrual period yet.  You are 44 years old and you are still having menstrual periods.  You have any of the symptoms mentioned above.  You develop a rash. SEEK IMMEDIATE MEDICAL CARE IF:   An oral temperature above 102 F (38.9 C) develops.  You develop chills.  You are changing your sanitary pad or tampon more than once an hour.  You develop abdominal pain.  You pass out or faint. Document Released: 09/24/2000 Document Revised: 12/20/2011 Document Reviewed: 08/26/2009 [employer] Patient Information 2015 [employer]. This information is not intended to replace advice given to you by your health care provider. Make sure you discuss any questions you have with your health care provider.  

## 2014-10-31 LAB — WET PREP, GENITAL
Trich, Wet Prep: NONE SEEN
Yeast Wet Prep HPF POC: NONE SEEN

## 2014-11-04 ENCOUNTER — Other Ambulatory Visit: Payer: Self-pay | Admitting: Obstetrics & Gynecology

## 2014-11-04 DIAGNOSIS — B9689 Other specified bacterial agents as the cause of diseases classified elsewhere: Secondary | ICD-10-CM

## 2014-11-04 DIAGNOSIS — N760 Acute vaginitis: Principal | ICD-10-CM

## 2014-11-04 DIAGNOSIS — N946 Dysmenorrhea, unspecified: Secondary | ICD-10-CM

## 2014-11-04 MED ORDER — IBUPROFEN 600 MG PO TABS: 600.0000 mg | ORAL_TABLET | Freq: Three times a day (TID) | ORAL | Status: DC | PRN

## 2014-11-04 MED ORDER — METRONIDAZOLE 500 MG PO TABS: 500.0000 mg | ORAL_TABLET | Freq: Two times a day (BID) | ORAL | Status: AC

## 2014-11-04 NOTE — Addendum Note (Signed)
Addended by: Shelly Coss on: 11/04/2014 03:01 PM   Modules accepted: Orders

## 2014-11-04 NOTE — Progress Notes (Signed)
Called patient and informed her of BV and antibiotic available for pickup at her pharmacy. Patient verbalized understanding and asked if this was going to cause a yeast infection. Told patient that if it does to call us and let us know and we will send in a medication to her pharmacy. Patient verbalized understanding and asked about a prescription for motrin because the cramps have been intense at times. Dr Ihor Dow approved Rx. Informed patient of prescription going to her pharmacy as well. Patient verbalized understanding and asked if she could have sex while taking the flagyl. Told patient yes but she should avoid alcohol while on the medication for a week. Patient verbalized understanding and asked if she could still douche. Told patient that douching isn't recommended in general because it wipes out the bodies normal flora and can cause things like BV. Patient verbalized understanding and had no other questions

## 2014-11-06 ENCOUNTER — Ambulatory Visit (HOSPITAL_COMMUNITY)
Admission: RE | Admit: 2014-11-06 | Discharge: 2014-11-06 | Disposition: A | Discharge status: Home / Self Care | Payer: Self-pay | Source: Ambulatory Visit | Attending: Obstetrics & Gynecology | Admitting: Obstetrics & Gynecology

## 2014-11-06 DIAGNOSIS — N921 Excessive and frequent menstruation with irregular cycle: Secondary | ICD-10-CM

## 2014-11-06 DIAGNOSIS — N939 Abnormal uterine and vaginal bleeding, unspecified: Secondary | ICD-10-CM | POA: Insufficient documentation

## 2014-11-07 ENCOUNTER — Telehealth: Payer: Self-pay | Admitting: Obstetrics & Gynecology

## 2014-11-07 ENCOUNTER — Other Ambulatory Visit: Payer: Self-pay

## 2014-11-07 ENCOUNTER — Other Ambulatory Visit: Payer: Self-pay | Admitting: Obstetrics & Gynecology

## 2014-11-07 DIAGNOSIS — IMO0002 Reserved for concepts with insufficient information to code with codable children: Secondary | ICD-10-CM

## 2014-11-07 DIAGNOSIS — N9489 Other specified conditions associated with female genital organs and menstrual cycle: Secondary | ICD-10-CM

## 2014-11-07 DIAGNOSIS — R102 Pelvic and perineal pain: Secondary | ICD-10-CM

## 2014-11-07 DIAGNOSIS — R229 Localized swelling, mass and lump, unspecified: Principal | ICD-10-CM

## 2014-11-07 LAB — COMPREHENSIVE METABOLIC PANEL
ALT: 12 U/L (ref 0–35)
AST: 11 U/L (ref 0–37)
Albumin: 4.2 g/dL (ref 3.5–5.2)
Alkaline Phosphatase: 127 U/L — ABNORMAL HIGH (ref 39–117)
BILIRUBIN TOTAL: 0.4 mg/dL (ref 0.2–1.2)
BUN: 10 mg/dL (ref 6–23)
CO2: 21 meq/L (ref 19–32)
Calcium: 9.8 mg/dL (ref 8.4–10.5)
Chloride: 94 mEq/L — ABNORMAL LOW (ref 96–112)
Creat: 1.01 mg/dL (ref 0.50–1.10)
Glucose, Bld: 535 mg/dL (ref 70–99)
Potassium: 4.5 mEq/L (ref 3.5–5.3)
Sodium: 129 mEq/L — ABNORMAL LOW (ref 135–145)
Total Protein: 6.9 g/dL (ref 6.0–8.3)

## 2014-11-07 MED ORDER — TRAMADOL HCL 50 MG PO TABS: 50.0000 mg | ORAL_TABLET | Freq: Four times a day (QID) | ORAL | Status: DC | PRN

## 2014-11-07 MED ORDER — ALPRAZOLAM 1 MG PO TABS: 1.0000 mg | ORAL_TABLET | Freq: Every evening | ORAL | Status: DC | PRN

## 2014-11-07 NOTE — Progress Notes (Signed)
Pt came to clinic today.  

## 2014-11-07 NOTE — Progress Notes (Signed)
I spoke to pt about her abnormal sonogram with the bilateral masses from 11/06/2014.  Pt reports that since the sono she has had pelvic pain and difficulty walking that she did not have prev.  Pt reports that she gets anxious in the MRI tube and she wants to be pretreated for anxiety prior to the study.  She had no specific questions after the results were given but she says that she wants the tests done ASAP.  Ultram 50mg  q 6 ours prn Xanax 1mg  po 1 hour prior to MRI MRI of pelvis Labs: CA 125 and CMP  Pt to come in today for labs and to pick up Rx.  Joanna Newman, M.D., Cherlynn June

## 2014-11-07 NOTE — Telephone Encounter (Signed)
TC to pt to discuss sono results.  Left message to call back.

## 2014-11-08 ENCOUNTER — Emergency Department (HOSPITAL_COMMUNITY)
Admission: EM | Admit: 2014-11-08 | Discharge: 2014-11-08 | Disposition: A | Discharge status: Home / Self Care | Payer: Self-pay | Attending: Emergency Medicine | Admitting: Emergency Medicine

## 2014-11-08 ENCOUNTER — Emergency Department (HOSPITAL_COMMUNITY): Payer: Self-pay

## 2014-11-08 ENCOUNTER — Telehealth: Payer: Self-pay | Admitting: Obstetrics & Gynecology

## 2014-11-08 ENCOUNTER — Encounter (HOSPITAL_COMMUNITY): Payer: Self-pay

## 2014-11-08 ENCOUNTER — Telehealth: Payer: Self-pay | Admitting: *Deleted

## 2014-11-08 DIAGNOSIS — Z9851 Tubal ligation status: Secondary | ICD-10-CM | POA: Insufficient documentation

## 2014-11-08 DIAGNOSIS — R1010 Upper abdominal pain, unspecified: Secondary | ICD-10-CM | POA: Insufficient documentation

## 2014-11-08 DIAGNOSIS — Z87891 Personal history of nicotine dependence: Secondary | ICD-10-CM | POA: Insufficient documentation

## 2014-11-08 DIAGNOSIS — R739 Hyperglycemia, unspecified: Secondary | ICD-10-CM

## 2014-11-08 DIAGNOSIS — Z3202 Encounter for pregnancy test, result negative: Secondary | ICD-10-CM | POA: Insufficient documentation

## 2014-11-08 DIAGNOSIS — E1165 Type 2 diabetes mellitus with hyperglycemia: Secondary | ICD-10-CM | POA: Insufficient documentation

## 2014-11-08 DIAGNOSIS — F329 Major depressive disorder, single episode, unspecified: Secondary | ICD-10-CM | POA: Insufficient documentation

## 2014-11-08 DIAGNOSIS — I10 Essential (primary) hypertension: Secondary | ICD-10-CM | POA: Insufficient documentation

## 2014-11-08 DIAGNOSIS — R002 Palpitations: Secondary | ICD-10-CM | POA: Insufficient documentation

## 2014-11-08 DIAGNOSIS — Z792 Long term (current) use of antibiotics: Secondary | ICD-10-CM | POA: Insufficient documentation

## 2014-11-08 DIAGNOSIS — J449 Chronic obstructive pulmonary disease, unspecified: Secondary | ICD-10-CM | POA: Insufficient documentation

## 2014-11-08 DIAGNOSIS — Z8719 Personal history of other diseases of the digestive system: Secondary | ICD-10-CM | POA: Insufficient documentation

## 2014-11-08 DIAGNOSIS — Z79899 Other long term (current) drug therapy: Secondary | ICD-10-CM | POA: Insufficient documentation

## 2014-11-08 DIAGNOSIS — E119 Type 2 diabetes mellitus without complications: Secondary | ICD-10-CM

## 2014-11-08 DIAGNOSIS — R079 Chest pain, unspecified: Secondary | ICD-10-CM | POA: Insufficient documentation

## 2014-11-08 DIAGNOSIS — E669 Obesity, unspecified: Secondary | ICD-10-CM | POA: Insufficient documentation

## 2014-11-08 LAB — BASIC METABOLIC PANEL
Anion gap: 11 (ref 5–15)
BUN: 7 mg/dL (ref 6–23)
CO2: 22 mmol/L (ref 19–32)
Calcium: 9.1 mg/dL (ref 8.4–10.5)
Chloride: 99 mmol/L (ref 96–112)
Creatinine, Ser: 1.1 mg/dL (ref 0.50–1.10)
GFR calc Af Amer: 70 mL/min — ABNORMAL LOW (ref >90)
GFR calc non Af Amer: 60 mL/min — ABNORMAL LOW (ref >90)
Glucose, Bld: 445 mg/dL — ABNORMAL HIGH (ref 70–99)
Potassium: 4.3 mmol/L (ref 3.5–5.1)
Sodium: 132 mmol/L — ABNORMAL LOW (ref 135–145)

## 2014-11-08 LAB — CA 125: CA 125: 8 U/mL (ref <35)

## 2014-11-08 LAB — CBG MONITORING, ED
GLUCOSE-CAPILLARY: 358 mg/dL — AB (ref 70–99)
Glucose-Capillary: 415 mg/dL — ABNORMAL HIGH (ref 70–99)

## 2014-11-08 LAB — URINALYSIS, ROUTINE W REFLEX MICROSCOPIC
Bilirubin Urine: NEGATIVE
Glucose, UA: >1000 mg/dL — AB
Hgb urine dipstick: NEGATIVE
Ketones, ur: 40 mg/dL — AB
Leukocytes, UA: NEGATIVE
Nitrite: NEGATIVE
Protein, ur: NEGATIVE mg/dL
Specific Gravity, Urine: 1.042 — ABNORMAL HIGH (ref 1.005–1.030)
Urobilinogen, UA: 0.2 mg/dL (ref 0.0–1.0)
pH: 5 (ref 5.0–8.0)

## 2014-11-08 LAB — CBC
HCT: 44.3 % (ref 36.0–46.0)
Hemoglobin: 15.1 g/dL — ABNORMAL HIGH (ref 12.0–15.0)
MCH: 29.3 pg (ref 26.0–34.0)
MCHC: 34.1 g/dL (ref 30.0–36.0)
MCV: 86 fL (ref 78.0–100.0)
Platelets: 364 10*3/uL (ref 150–400)
RBC: 5.15 MIL/uL — ABNORMAL HIGH (ref 3.87–5.11)
RDW: 13.5 % (ref 11.5–15.5)
WBC: 10.3 10*3/uL (ref 4.0–10.5)

## 2014-11-08 LAB — URINE MICROSCOPIC-ADD ON

## 2014-11-08 LAB — PREGNANCY, URINE: Preg Test, Ur: NEGATIVE

## 2014-11-08 LAB — I-STAT TROPONIN, ED: Troponin i, poc: 0 ng/mL (ref 0.00–0.08)

## 2014-11-08 LAB — LIPASE, BLOOD: LIPASE: 26 U/L (ref 11–59)

## 2014-11-08 MED ORDER — METFORMIN HCL 500 MG PO TABS
500.0000 mg | ORAL_TABLET | Freq: Two times a day (BID) | ORAL | Status: DC
  Administered 2014-11-08: 500 mg via ORAL
  Filled 2014-11-08: qty 1

## 2014-11-08 MED ORDER — SODIUM CHLORIDE 0.9 % IV BOLUS (SEPSIS)
2000.0000 mL | Freq: Once | INTRAVENOUS | Status: AC
  Administered 2014-11-08: 2000 mL via INTRAVENOUS

## 2014-11-08 MED ORDER — METFORMIN HCL 500 MG PO TABS: 500.0000 mg | ORAL_TABLET | Freq: Two times a day (BID) | ORAL | Status: DC

## 2014-11-08 NOTE — ED Provider Notes (Signed)
Patient care assumed from Dahlia Bailiff, PA-C at shift change. Please see his note for further.  Joanna Newman is a 44 year old female with past medical history of asthma, COPD, IBS, obesity, hypertension who presents the ER complaining of hyperglycemia. Patient states she was being seen and evaluated at her OB/GYN several days ago, and had blood work drawn. Her OB/GYN and called her today noting that her blood work resulted with a blood sugar of 500 and encourage her to go to the ER for further evaluation. Patient had initial BGL of 455. Patient does not have an anion gap on BMP. CBC is unremarkable. She has a negative urine pregnancy test. Patient given 2 L of fluids in ED as well as her first dose of metformin. Patient reports feeling well and ready to be discharged at my evaluation. Her blood sugar improved to 358. Patient given prescription for metformin 500 mg twice a day. Patient [facility]/2014. Advised her to keep this appointment. Education provided on diet with diabetes. Strict return precautions are provided.. I advised the patient to return to the emergency department with new or worsening symptoms or new concerns. The patient verbalized understanding and agreement with plan.   This patient was discussed with Dr. Eulis Foster who agrees with assessment and plan.    Hanley Hays, PA-C 11/09/14 0158  Richarda Blade, MD 11/09/14 1155

## 2014-11-08 NOTE — Telephone Encounter (Signed)
Pt with serum glucose >500.  She was counseled via voicemail to call our office before noon today and if she got this message after to go to the nearest ED to get her glucose checked.  Vollie Brunty L. Harraway-Smith, M.D., Cherlynn June

## 2014-11-08 NOTE — ED Notes (Signed)
Pt states she had gone to her OB for some female problems the other day and her sugar was 530. Has been feeling tired and thirsty the past three weeks. Hasn't had a reason to check it otherwise. Fluttering in her chest intermittently for a week now.

## 2014-11-08 NOTE — Discharge Instructions (Signed)
Diabetes and Exercise Exercising regularly is important. It is not just about losing weight. It has many health benefits, such as:  Improving your overall fitness, flexibility, and endurance.  Increasing your bone density.  Helping with weight control.  Decreasing your body fat.  Increasing your muscle strength.  Reducing stress and tension.  Improving your overall health. People with diabetes who exercise gain additional benefits because exercise:  Reduces appetite.  Improves the body's use of blood sugar (glucose).  Helps lower or control blood glucose.  Decreases blood pressure.  Helps control blood lipids (such as cholesterol and triglycerides).  Improves the body's use of the hormone insulin by:  Increasing the body's insulin sensitivity.  Reducing the body's insulin needs.  Decreases the risk for heart disease because exercising:  Lowers cholesterol and triglycerides levels.  Increases the levels of good cholesterol (such as high-density lipoproteins [HDL]) in the body.  Lowers blood glucose levels. YOUR ACTIVITY PLAN  Choose an activity that you enjoy and set realistic goals. Your health care provider or diabetes educator can help you make an activity plan that works for you. Exercise regularly as directed by your health care provider. This includes:  Performing resistance training twice a week such as push-ups, sit-ups, lifting weights, or using resistance bands.  Performing 150 minutes of cardio exercises each week such as walking, running, or playing sports.  Staying active and spending no more than 90 minutes at one time being inactive. Even short bursts of exercise are good for you. Three 10-minute sessions spread throughout the day are just as beneficial as a single 30-minute session. Some exercise ideas include:  Taking the dog for a walk.  Taking the stairs instead of the elevator.  Dancing to your favorite song.  Doing an exercise  video.  Doing your favorite exercise with a friend. RECOMMENDATIONS FOR EXERCISING WITH TYPE 1 OR TYPE 2 DIABETES   Check your blood glucose before exercising. If blood glucose levels are greater than 240 mg/dL, check for urine ketones. Do not exercise if ketones are present.  Avoid injecting insulin into areas of the body that are going to be exercised. For example, avoid injecting insulin into:  The arms when playing tennis.  The legs when jogging.  Keep a record of:  Food intake before and after you exercise.  Expected peak times of insulin action.  Blood glucose levels before and after you exercise.  The type and amount of exercise you have done.  Review your records with your health care provider. Your health care provider will help you to develop guidelines for adjusting food intake and insulin amounts before and after exercising.  If you take insulin or oral hypoglycemic agents, watch for signs and symptoms of hypoglycemia. They include:  Dizziness.  Shaking.  Sweating.  Chills.  Confusion.  Drink plenty of water while you exercise to prevent dehydration or heat stroke. Body water is lost during exercise and must be replaced.  Talk to your health care provider before starting an exercise program to make sure it is safe for you. Remember, almost any type of activity is better than none. Document Released: 12/18/2003 Document Revised: 02/11/2014 Document Reviewed: 03/06/2013 [facility] Patient Information 2015 [place], [place]. This information is not intended to replace advice given to you by your health care provider. Make sure you discuss any questions you have with your health care provider. Basic Carbohydrate Counting for Diabetes Mellitus Carbohydrate counting is a method for keeping track of the amount of carbohydrates you eat.  Eating carbohydrates naturally increases the level of sugar (glucose) in your blood, so it is important for you to know the amount that is  okay for you to have in every meal. Carbohydrate counting helps keep the level of glucose in your blood within normal limits. The amount of carbohydrates allowed is different for every person. A dietitian can help you calculate the amount that is right for you. Once you know the amount of carbohydrates you can have, you can count the carbohydrates in the foods you want to eat. Carbohydrates are found in the following foods: Grains, such as breads and cereals. Dried beans and soy products. Starchy vegetables, such as potatoes, peas, and corn. Fruit and fruit juices. Milk and yogurt. Sweets and snack foods, such as cake, cookies, candy, chips, soft drinks, and fruit drinks. CARBOHYDRATE COUNTING There are two ways to count the carbohydrates in your food. You can use either of the methods or a combination of both. Reading the "Nutrition Facts" on Marks The "Nutrition Facts" is an area that is included on the labels of almost all packaged food and beverages in the [place]. It includes the serving size of that food or beverage and information about the nutrients in each serving of the food, including the grams (g) of carbohydrate per serving.  Decide the number of servings of this food or beverage that you will be able to eat or drink. Multiply that number of servings by the number of grams of carbohydrate that is listed on the label for that serving. The total will be the amount of carbohydrates you will be having when you eat or drink this food or beverage. Learning Standard Serving Sizes of Food When you eat food that is not packaged or does not include "Nutrition Facts" on the label, you need to measure the servings in order to count the amount of carbohydrates.A serving of most carbohydrate-rich foods contains about 15 g of carbohydrates. The following list includes serving sizes of carbohydrate-rich foods that provide 15 g ofcarbohydrate per serving:  1 slice of bread (1 oz) or 1  six-inch tortilla.   of a hamburger bun or English muffin. 4-6 crackers.  cup unsweetened dry cereal.   cup hot cereal.  cup rice or pasta.   cup mashed potatoes or  of a large baked potato. 1 cup fresh fruit or one small piece of fruit.   cup canned or frozen fruit or fruit juice. 1 cup milk.  cup plain fat-free yogurt or yogurt sweetened with artificial sweeteners.  cup cooked dried beans or starchy vegetable, such as peas, corn, or potatoes.  Decide the number of standard-size servings that you will eat. Multiply that number of servings by 15 (the grams of carbohydrates in that serving). For example, if you eat 2 cups of strawberries, you will have eaten 2 servings and 30 g of carbohydrates (2 servings x 15 g = 30 g). For foods such as soups and casseroles, in which more than one food is mixed in, you will need to count the carbohydrates in each food that is included. EXAMPLE OF CARBOHYDRATE COUNTING Sample Dinner 3 oz chicken breast.  cup of brown rice.  cup of corn. 1 cup milk.  1 cup strawberries with sugar-free whipped topping.  Carbohydrate Calculation Step 1: Identify the foods that contain carbohydrates:  Rice.  Corn.  Milk.  Strawberries. Step 2:Calculate the number of servings eaten of each:  2 servings of rice.  1 serving of corn.  1  serving of milk.  1 serving of strawberries. Step 3: Multiply each of those number of servings by 15 g:  2 servings of rice x 15 g = 30 g.  1 serving of corn x 15 g = 15 g.  1 serving of milk x 15 g = 15 g.  1 serving of strawberries x 15 g = 15 g. Step 4: Add together all of the amounts to find the total grams of carbohydrates eaten: 30 g + 15 g + 15 g + 15 g = 75 g. Document Released: 09/27/2005 Document Revised: 02/11/2014 Document Reviewed: 08/24/2013 [facility] Patient Information 2015 [place], [place]. This information is not intended to replace advice given to you by your health care provider. Make sure  you discuss any questions you have with your health care provider. Hyperglycemia Hyperglycemia occurs when the glucose (sugar) in your blood is too high. Hyperglycemia can happen for many reasons, but it most often happens to people who do not know they have diabetes or are not managing their diabetes properly.  CAUSES  Whether you have diabetes or not, there are other causes of hyperglycemia. Hyperglycemia can occur when you have diabetes, but it can also occur in other situations that you might not be as aware of, such as: Diabetes If you have diabetes and are having problems controlling your blood glucose, hyperglycemia could occur because of some of the following reasons: Not following your meal plan. Not taking your diabetes medications or not taking it properly. Exercising less or doing less activity than you normally do. Being sick. Pre-diabetes This cannot be ignored. Before people develop Type 2 diabetes, they almost always have "pre-diabetes." This is when your blood glucose levels are higher than normal, but not yet high enough to be diagnosed as diabetes. Research has shown that some long-term damage to the body, especially the heart and circulatory system, may already be occurring during pre-diabetes. If you take action to manage your blood glucose when you have pre-diabetes, you may delay or prevent Type 2 diabetes from developing. Stress If you have diabetes, you may be "diet" controlled or on oral medications or insulin to control your diabetes. However, you may find that your blood glucose is higher than usual in the hospital whether you have diabetes or not. This is often referred to as "stress hyperglycemia." Stress can elevate your blood glucose. This happens because of hormones put out by the body during times of stress. If stress has been the cause of your high blood glucose, it can be followed regularly by your caregiver. That way he/she can make sure your hyperglycemia does not  continue to get worse or progress to diabetes. Steroids Steroids are medications that act on the infection fighting system (immune system) to block inflammation or infection. One side effect can be a rise in blood glucose. Most people can produce enough extra insulin to allow for this rise, but for those who cannot, steroids make blood glucose levels go even higher. It is not unusual for steroid treatments to "uncover" diabetes that is developing. It is not always possible to determine if the hyperglycemia will go away after the steroids are stopped. A special blood test called an A1c is sometimes done to determine if your blood glucose was elevated before the steroids were started. SYMPTOMS Thirsty. Frequent urination. Dry mouth. Blurred vision. Tired or fatigue. Weakness. Sleepy. Tingling in feet or leg. DIAGNOSIS  Diagnosis is made by monitoring blood glucose in one or all of the following ways:  A1c test. This is a chemical found in your blood. Fingerstick blood glucose monitoring. Laboratory results. TREATMENT  First, knowing the cause of the hyperglycemia is important before the hyperglycemia can be treated. Treatment may include, but is not be limited to: Education. Change or adjustment in medications. Change or adjustment in meal plan. Treatment for an illness, infection, etc. More frequent blood glucose monitoring. Change in exercise plan. Decreasing or stopping steroids. Lifestyle changes. HOME CARE INSTRUCTIONS  Test your blood glucose as directed. Exercise regularly. Your caregiver will give you instructions about exercise. Pre-diabetes or diabetes which comes on with stress is helped by exercising. Eat wholesome, balanced meals. Eat often and at regular, fixed times. Your caregiver or nutritionist will give you a meal plan to guide your sugar intake. Being at an ideal weight is important. If needed, losing as little as 10 to 15 pounds may help improve blood glucose  levels. SEEK MEDICAL CARE IF:  You have questions about medicine, activity, or diet. You continue to have symptoms (problems such as increased thirst, urination, or weight gain). SEEK IMMEDIATE MEDICAL CARE IF:  You are vomiting or have diarrhea. Your breath smells fruity. You are breathing faster or slower. You are very sleepy or incoherent. You have numbness, tingling, or pain in your feet or hands. You have chest pain. Your symptoms get worse even though you have been following your caregiver's orders. If you have any other questions or concerns. Document Released: 03/23/2001 Document Revised: 12/20/2011 Document Reviewed: 01/24/2012 [facility] Patient Information 2015 [place], [place]. This information is not intended to replace advice given to you by your health care provider. Make sure you discuss any questions you have with your health care provider.

## 2014-11-08 NOTE — ED Notes (Addendum)
Pt reports being seen at obgyn a few days ago and her sugar was in the 500's.  Pt doctor called to send pt to the hospital.  Pt also reports thirst, dizziness, weakness, and hemorrhoids x2 days.  Pt also c/o right chest fluttering x1 week with no radiation. Pt denies CP at this time.  Pt alert and oriented.

## 2014-11-08 NOTE — Telephone Encounter (Signed)
Pt returned call.  She was notified that her serum glucose was 535.  She reports that she does not have a primary care provider.  She reports that she was seen prev and told that she needed to be started on Metformin but due to lots of paperwork she did not get treated.  She was told to go to the nearest ER to get treated as we will not be able to get her to primary care in a timely manner.  Pt expressed understanding and reports that she will go to the ED.

## 2014-11-08 NOTE — ED Provider Notes (Signed)
CSN: [id number]     Arrival date & time 11/08/14  1152 History   First MD Initiated Contact with Patient 11/08/14 1257     Chief Complaint  Patient presents with  . Hyperglycemia  . Chest Pain     (Consider location/radiation/quality/duration/timing/severity/associated sxs/prior Treatment) HPI Joanna Newman is a 44 year old female with past medical history of asthma, COPD, IBS, obesity, hypertension who presents the ER complaining of hyperglycemia. Patient states she was being seen and evaluated at her OB/GYN several days ago, and had blood work drawn. Her OB/GYN and called her today noting that her blood work resulted with a blood sugar of 500 and encourage her to go to the ER for further evaluation. Patient reports generalized fatigue and extreme thirst over the past [redacted] weeks along with episodes of "dizzy spells" when she stands up. Patient reports 3-4 episodes per week over the past 2 weeks of a "fluttering sensation" in her chest. Patient denies chest pain, shortness of breath, syncope, headache, nausea, vomiting. Patient states she has had abdominal pain, however this is related to ovarian cysts which she is being followed for at her OB/GYN.   Past Medical History  Diagnosis Date  . Substance abuse     cocaine, marijuana and alcohol abuse. quit end of 2012  . Asthma   . Depression   . COPD (chronic obstructive pulmonary disease)   . IBS (irritable bowel syndrome)   . Obesity   . Hypertension    Past Surgical History  Procedure Laterality Date  . Knee surgery    . Tubal ligation     Family History  Problem Relation Age of Onset  . Hypertension Mother   . Cancer Mother     colon  . Heart disease Father     heart failure  . Diabetes Father    History  Substance Use Topics  . Smoking status: Former Smoker -- 0.25 packs/day    Types: Cigarettes    Quit date: 03/11/2012  . Smokeless tobacco: Never Used  . Alcohol Use: Yes     Comment: once a month   OB History    Gravida Para Term Preterm AB TAB SAB Ectopic Multiple Living   5 3 3  2 2    3      Review of Systems  Constitutional: Negative for fever.  HENT: Negative for trouble swallowing.   Eyes: Negative for visual disturbance.  Respiratory: Negative for shortness of breath.   Cardiovascular: Positive for palpitations. Negative for chest pain.  Gastrointestinal: Negative for nausea, vomiting and abdominal pain.  Endocrine: Positive for polydipsia and polyuria.  Genitourinary: Negative for dysuria.  Musculoskeletal: Negative for neck pain.  Skin: Negative for rash.  Neurological: Positive for light-headedness. Negative for dizziness, weakness and numbness.  Psychiatric/Behavioral: Negative.       Allergies  Lisinopril  Home Medications   Prior to Admission medications   Medication Sig Start Date End Date Taking? Authorizing Provider  albuterol (PROVENTIL HFA;VENTOLIN HFA) 108 (90 BASE) MCG/ACT inhaler Inhale 1 puff into the lungs every 6 (six) hours as needed for wheezing or shortness of breath.   Yes Historical Provider, MD  albuterol (PROVENTIL) (2.5 MG/3ML) 0.083% nebulizer solution Take 2.5 mg by nebulization every 6 (six) hours as needed for wheezing or shortness of breath.    Yes Historical Provider, MD  ALPRAZolam Duanne Moron) 1 MG tablet Take 1 tablet (1 mg total) by mouth at bedtime as needed for anxiety (pt to tak e1 hour prior to MRI). 11/07/14  Yes Lavonia Drafts, MD  hydrochlorothiazide (MICROZIDE) 12.5 MG capsule Take 12.5 mg by mouth daily.   Yes Historical Provider, MD  ibuprofen (ADVIL,MOTRIN) 600 MG tablet Take 1 tablet (600 mg total) by mouth every 8 (eight) hours as needed. Patient taking differently: Take 600 mg by mouth every 8 (eight) hours as needed for moderate pain.  11/04/14  Yes Lavonia Drafts, MD  Melatonin 3 MG TABS Take 1 tablet by mouth at bedtime.   Yes Historical Provider, MD  metoprolol (LOPRESSOR) 50 MG tablet Take 50 mg by mouth 2 (two) times  daily.   Yes Historical Provider, MD  metroNIDAZOLE (FLAGYL) 500 MG tablet Take 1 tablet (500 mg total) by mouth 2 (two) times daily. Patient taking differently: Take 500 mg by mouth 2 (two) times daily. Patient states she has three more days left on medication as of 11-08-14 11/04/14 11/11/14 Yes Lavonia Drafts, MD  QUEtiapine (SEROQUEL) 50 MG tablet Take 50 mg by mouth at bedtime.   Yes Historical Provider, MD  ranitidine (ZANTAC) 150 MG tablet Take 150 mg by mouth 3 (three) times daily.   Yes Historical Provider, MD  albuterol (PROVENTIL HFA;VENTOLIN HFA) 108 (90 BASE) MCG/ACT inhaler Inhale 2 puffs into the lungs every 6 (six) hours as needed for wheezing. 05/18/12 06/23/14  Kandis Nab, MD  HYDROcodone-acetaminophen (NORCO/VICODIN) 5-325 MG per tablet Take 2 tablets by mouth every 4 (four) hours as needed for moderate pain or severe pain. Patient not taking: Reported on 09/18/2014 06/23/14   Domenic Moras, PA-C  metFORMIN (GLUCOPHAGE) 500 MG tablet Take 1 tablet (500 mg total) by mouth 2 (two) times daily with a meal. 11/08/14   Carrie Mew, PA-C  omeprazole (PRILOSEC) 20 MG capsule Take 1 capsule (20 mg total) by mouth daily. Patient not taking: Reported on 09/18/2014 06/23/14   Domenic Moras, PA-C  traMADol (ULTRAM) 50 MG tablet Take 1 tablet (50 mg total) by mouth every 6 (six) hours as needed. Patient not taking: Reported on 11/08/2014 11/07/14   Lavonia Drafts, MD   BP 148/116 mmHg  Pulse 104  Temp(Src) 98.4 F (36.9 C) (Oral)  Resp 22  Ht 5\' 3"  (1.6 m)  Wt 324 lb (146.965 kg)  BMI 57.41 kg/m2  SpO2 99%  LMP 09/16/2014 Physical Exam  Constitutional: She is oriented to person, place, and time. She appears well-developed and well-nourished. No distress.  Morbidly obese female, well-appearing and in no acute distress  HENT:  Head: Normocephalic and atraumatic.  Mouth/Throat: Oropharynx is clear and moist. No oropharyngeal exudate.  Eyes: Right eye exhibits no discharge. Left  eye exhibits no discharge. No scleral icterus.  Neck: Normal range of motion.  Cardiovascular: Normal rate, regular rhythm, S1 normal, S2 normal and normal heart sounds.   No murmur heard. Pulmonary/Chest: Effort normal and breath sounds normal. No accessory muscle usage. No tachypnea. No respiratory distress.  Abdominal: Soft. Normal appearance. There is tenderness. There is no rigidity, no guarding, no tenderness at McBurney's point and negative Murphy's sign.  Diffuse, mild upper abdominal tenderness   Musculoskeletal: Normal range of motion. She exhibits no edema or tenderness.  Neurological: She is alert and oriented to person, place, and time. She has normal strength. No cranial nerve deficit or sensory deficit. Coordination normal. GCS eye subscore is 4. GCS verbal subscore is 5. GCS motor subscore is 6.  Patient fully alert, answering questions appropriately in full, clear sentences. Cranial nerves II through XII grossly intact. Motor strength 5 out of 5 in all  major muscle groups of upper and lower extremity. Distal sensation intact.  Skin: Skin is warm and dry. No rash noted. She is not diaphoretic.  Psychiatric: She has a normal mood and affect.  Nursing note and vitals reviewed.   ED Course  Procedures (including critical care time) Labs Review Labs Reviewed  CBC - Abnormal; Notable for the following:    RBC 5.15 (*)    Hemoglobin 15.1 (*)    All other components within normal limits  BASIC METABOLIC PANEL - Abnormal; Notable for the following:    Sodium 132 (*)    Glucose, Bld 445 (*)    GFR calc non Af Amer 60 (*)    GFR calc Af Amer 70 (*)    All other components within normal limits  URINALYSIS, ROUTINE W REFLEX MICROSCOPIC - Abnormal; Notable for the following:    Specific Gravity, Urine 1.042 (*)    Glucose, UA >1000 (*)    Ketones, ur 40 (*)    All other components within normal limits  URINE MICROSCOPIC-ADD ON - Abnormal; Notable for the following:    Squamous  Epithelial / LPF FEW (*)    All other components within normal limits  CBG MONITORING, ED - Abnormal; Notable for the following:    Glucose-Capillary 415 (*)    All other components within normal limits  PREGNANCY, URINE  LIPASE, BLOOD  HEMOGLOBIN A1C  I-STAT TROPOININ, ED    Imaging Review Dg Chest 2 View  11/08/2014   CLINICAL DATA:  Chest pain.  EXAM: CHEST  2 VIEW  COMPARISON:  September 02, 2012.  FINDINGS: The heart size and mediastinal contours are within normal limits. Both lungs are clear. No pneumothorax or pleural effusion is noted. The visualized skeletal structures are unremarkable.  IMPRESSION: No acute cardiopulmonary abnormality seen.   Electronically Signed   By: Sabino Dick M.D.   On: 11/08/2014 13:53     EKG Interpretation None      MDM   Final diagnoses:  Type 2 diabetes mellitus without complication    Patient here with hyperglycemia, has not been diagnosed with diabetes. We'll rehydrate patient with fluid boluses, and reassess symptoms. Labs do not show evidence of acute pathology. No concern for DKA based on lab work. Patient also describing palpitations which have been occurring along with her generalized fatigue and lightheadedness over the past several weeks, patient denying any chest pain. Patient at low risk for ACS, troponin negative, EKG unremarkable for acute injury or ectopy. With benign workup today and several weeks of symptoms, there is no concern for ACS at this point. Patient mildly tachycardic at 100-107 during ER stay, this could be due to dehydration. Patient non-tachypneic on exam, non-hypoxic, well-appearing and in no acute distress.  5:45 PM: Patient still receiving fluids, will sign out patient to Will Dansie, PA-C with plan to follow-up on patient's CBG, and reassess symptoms after fluid boluses to determine if patient needs further hydration and treatment. Plan to discharge patient home if symptoms are resolving and CBG is decreasing. Plan  to discharge with prescription of metformin 500 mg twice a day. Social worker has spoken with patient, and has set up [facility] for [facility] care [facility]. Patient states she plans on following up with them to help control her diabetes.  Signed,  Dahlia Bailiff, PA-C 5:56 PM  Patient discussed with Dr. Virgel Manifold, M.D.  Carrie Mew, PA-C 11/08/14 1756  Virgel Manifold, MD 11/11/14  1437 

## 2014-11-08 NOTE — Telephone Encounter (Signed)
Received a call from [facility] lab of critical lab glucose=535. Per chart review seen for irregular bleeding. Reported glucose 535 to Dr. Lavonia Drafts. No orders- she states she will address the critical glucose and contact patient.

## 2014-11-08 NOTE — Progress Notes (Signed)
Community Health & Eligibility Specialist  Spoke to patient about the [facility] orange card and establishing primary care. Orange card application given and explained. Patient was instructed to meet me at the [facility] 11/13/14 to enroll for the orange card. Patient was drowsy at the time, informed patient to contact me once discharged for further instructions. My contact information provided for any future questions or concerns. No other needs identified at this time.

## 2014-11-08 NOTE — ED Notes (Signed)
Pt made aware to return if symptoms worsen or if any life threatening symptoms occur.   

## 2014-11-09 ENCOUNTER — Ambulatory Visit (HOSPITAL_BASED_OUTPATIENT_CLINIC_OR_DEPARTMENT_OTHER): Payer: Self-pay

## 2014-11-09 LAB — HEMOGLOBIN A1C
HEMOGLOBIN A1C: 14.3 % — AB (ref 4.8–5.6)
Mean Plasma Glucose: 364 mg/dL

## 2014-11-11 ENCOUNTER — Other Ambulatory Visit: Payer: Self-pay | Admitting: Obstetrics & Gynecology

## 2014-11-11 DIAGNOSIS — N9489 Other specified conditions associated with female genital organs and menstrual cycle: Secondary | ICD-10-CM

## 2014-11-16 ENCOUNTER — Ambulatory Visit (HOSPITAL_BASED_OUTPATIENT_CLINIC_OR_DEPARTMENT_OTHER)
Admission: RE | Admit: 2014-11-16 | Discharge: 2014-11-16 | Disposition: A | Discharge status: Home / Self Care | Payer: Self-pay | Source: Ambulatory Visit | Attending: Obstetrics & Gynecology | Admitting: Obstetrics & Gynecology

## 2014-11-16 DIAGNOSIS — N9489 Other specified conditions associated with female genital organs and menstrual cycle: Secondary | ICD-10-CM

## 2014-11-18 ENCOUNTER — Telehealth: Payer: Self-pay | Admitting: *Deleted

## 2014-11-18 NOTE — Telephone Encounter (Addendum)
Called Joanna Newman and she reports they were not able to do the MRI because they couldn't get IV started and rescheduled it for 12/06/14. States her pain is up to 8 depending on her activity, sometimes can't do anything and has tried alternating the tramadol and ibuprofen, taking tramadol only, taking ibuprofen only, nothing helps her pain. I informed her I would need to talk with a provider and call her back.

## 2014-11-18 NOTE — Telephone Encounter (Signed)
Joanna Newman called and left  A message stating she is leaving a message for her doctor. States she went for MRI Saturday and they were not able to complete it and rescheduled it for 12/06/14 when she had someone to drive her which is fine.  Wants to know if there is anything else she can do for her pelvic pain which is a nagging pain and also pain near her navel and vomiting and light bleeding. States had to sleep on the floor because of her pain. States the tramadol and ibuprofen is not helping. Also states she has 1 alprazolam left  Because she didn't take one after MRI because they didn't finish.

## 2014-11-18 NOTE — Telephone Encounter (Signed)
Reviewed chart with Dr. Harolyn Rutherford and patient complaints.  Called [place] and instructed her per Dr. Harolyn Rutherford that she can not give her a new prescription for pain meds, but that she can take 2 of her tramadol which will be a total of 100mg  and one ibuprofen all at once no more often than every 6 hours. That should help her pain. I advised her not to take 2 tramadol and 1 ibuprofen all the time, just when her pain is worse. If her pain is severe and her meds are not helping, then she should come to [facility] for evaluation. I also will send to Dr. Maylene RoesTamala Julian for notification.

## 2014-11-27 ENCOUNTER — Ambulatory Visit (HOSPITAL_COMMUNITY): Payer: Self-pay

## 2014-12-05 ENCOUNTER — Emergency Department (HOSPITAL_COMMUNITY): Payer: Self-pay

## 2014-12-05 ENCOUNTER — Emergency Department (HOSPITAL_COMMUNITY)
Admission: EM | Admit: 2014-12-05 | Discharge: 2014-12-05 | Disposition: A | Discharge status: Home / Self Care | Payer: Self-pay | Attending: Emergency Medicine | Admitting: Emergency Medicine

## 2014-12-05 ENCOUNTER — Encounter (HOSPITAL_COMMUNITY): Payer: Self-pay | Admitting: *Deleted

## 2014-12-05 DIAGNOSIS — Z9851 Tubal ligation status: Secondary | ICD-10-CM | POA: Insufficient documentation

## 2014-12-05 DIAGNOSIS — Z3202 Encounter for pregnancy test, result negative: Secondary | ICD-10-CM | POA: Insufficient documentation

## 2014-12-05 DIAGNOSIS — F329 Major depressive disorder, single episode, unspecified: Secondary | ICD-10-CM | POA: Insufficient documentation

## 2014-12-05 DIAGNOSIS — Z87891 Personal history of nicotine dependence: Secondary | ICD-10-CM | POA: Insufficient documentation

## 2014-12-05 DIAGNOSIS — R109 Unspecified abdominal pain: Secondary | ICD-10-CM

## 2014-12-05 DIAGNOSIS — I10 Essential (primary) hypertension: Secondary | ICD-10-CM | POA: Insufficient documentation

## 2014-12-05 DIAGNOSIS — R112 Nausea with vomiting, unspecified: Secondary | ICD-10-CM | POA: Insufficient documentation

## 2014-12-05 DIAGNOSIS — Z79899 Other long term (current) drug therapy: Secondary | ICD-10-CM | POA: Insufficient documentation

## 2014-12-05 DIAGNOSIS — J449 Chronic obstructive pulmonary disease, unspecified: Secondary | ICD-10-CM | POA: Insufficient documentation

## 2014-12-05 DIAGNOSIS — E669 Obesity, unspecified: Secondary | ICD-10-CM | POA: Insufficient documentation

## 2014-12-05 DIAGNOSIS — R1031 Right lower quadrant pain: Secondary | ICD-10-CM | POA: Insufficient documentation

## 2014-12-05 LAB — COMPREHENSIVE METABOLIC PANEL
ALT: 18 U/L (ref 0–35)
AST: 23 U/L (ref 0–37)
Albumin: 4.4 g/dL (ref 3.5–5.2)
Alkaline Phosphatase: 107 U/L (ref 39–117)
Anion gap: 14 (ref 5–15)
BILIRUBIN TOTAL: 0.8 mg/dL (ref 0.3–1.2)
BUN: 9 mg/dL (ref 6–23)
CO2: 19 mmol/L (ref 19–32)
CREATININE: 0.83 mg/dL (ref 0.50–1.10)
Calcium: 9.1 mg/dL (ref 8.4–10.5)
Chloride: 102 mmol/L (ref 96–112)
GFR calc Af Amer: >90 mL/min (ref >90)
GFR calc non Af Amer: 85 mL/min — ABNORMAL LOW (ref >90)
Glucose, Bld: 364 mg/dL — ABNORMAL HIGH (ref 70–99)
Potassium: 4.2 mmol/L (ref 3.5–5.1)
SODIUM: 135 mmol/L (ref 135–145)
Total Protein: 7.9 g/dL (ref 6.0–8.3)

## 2014-12-05 LAB — CBC WITH DIFFERENTIAL/PLATELET
Basophils Absolute: 0.1 10*3/uL (ref 0.0–0.1)
Basophils Relative: 1 % (ref 0–1)
EOS ABS: 0 10*3/uL (ref 0.0–0.7)
Eosinophils Relative: 0 % (ref 0–5)
HCT: 42.1 % (ref 36.0–46.0)
Hemoglobin: 14.1 g/dL (ref 12.0–15.0)
Lymphocytes Relative: 25 % (ref 12–46)
Lymphs Abs: 2.7 10*3/uL (ref 0.7–4.0)
MCH: 29.5 pg (ref 26.0–34.0)
MCHC: 33.5 g/dL (ref 30.0–36.0)
MCV: 88.1 fL (ref 78.0–100.0)
MONOS PCT: 4 % (ref 3–12)
Monocytes Absolute: 0.5 10*3/uL (ref 0.1–1.0)
Neutro Abs: 7.5 10*3/uL (ref 1.7–7.7)
Neutrophils Relative %: 70 % (ref 43–77)
Platelets: 403 10*3/uL — ABNORMAL HIGH (ref 150–400)
RBC: 4.78 MIL/uL (ref 3.87–5.11)
RDW: 13.3 % (ref 11.5–15.5)
WBC: 10.8 10*3/uL — ABNORMAL HIGH (ref 4.0–10.5)

## 2014-12-05 LAB — URINALYSIS, ROUTINE W REFLEX MICROSCOPIC
Bilirubin Urine: NEGATIVE
Ketones, ur: 40 mg/dL — AB
LEUKOCYTES UA: NEGATIVE
Nitrite: NEGATIVE
PH: 5 (ref 5.0–8.0)
Protein, ur: NEGATIVE mg/dL
Specific Gravity, Urine: 1.02 (ref 1.005–1.030)
Urobilinogen, UA: 0.2 mg/dL (ref 0.0–1.0)

## 2014-12-05 LAB — URINE MICROSCOPIC-ADD ON

## 2014-12-05 LAB — PREGNANCY, URINE: Preg Test, Ur: NEGATIVE

## 2014-12-05 LAB — LIPASE, BLOOD: Lipase: 20 U/L (ref 11–59)

## 2014-12-05 MED ORDER — IOHEXOL 300 MG/ML  SOLN
100.0000 mL | Freq: Once | INTRAMUSCULAR | Status: AC | PRN
  Administered 2014-12-05: 100 mL via INTRAVENOUS

## 2014-12-05 MED ORDER — ONDANSETRON HCL 4 MG/2ML IJ SOLN
4.0000 mg | Freq: Once | INTRAMUSCULAR | Status: AC
  Administered 2014-12-05: 4 mg via INTRAVENOUS
  Filled 2014-12-05: qty 2

## 2014-12-05 MED ORDER — HYDROMORPHONE HCL 1 MG/ML IJ SOLN
1.0000 mg | Freq: Once | INTRAMUSCULAR | Status: AC
  Administered 2014-12-05: 1 mg via INTRAVENOUS
  Filled 2014-12-05: qty 1

## 2014-12-05 MED ORDER — SODIUM CHLORIDE 0.9 % IV SOLN
1000.0000 mL | INTRAVENOUS | Status: DC
  Administered 2014-12-05: 1000 mL via INTRAVENOUS

## 2014-12-05 MED ORDER — HYDROCODONE-ACETAMINOPHEN 5-325 MG PO TABS: 1.0000 | ORAL_TABLET | Freq: Four times a day (QID) | ORAL | Status: DC | PRN

## 2014-12-05 MED ORDER — SODIUM CHLORIDE 0.9 % IV SOLN
1000.0000 mL | Freq: Once | INTRAVENOUS | Status: AC
  Administered 2014-12-05: 1000 mL via INTRAVENOUS

## 2014-12-05 NOTE — ED Notes (Signed)
Pt remains in ultrasound at this time.

## 2014-12-05 NOTE — Discharge Instructions (Signed)
Abdominal Pain, Women °Abdominal (stomach, pelvic, or belly) pain can be caused by many things. It is important to tell your doctor: °· The location of the pain. °· Does it come and go or is it present all the time? °· Are there things that start the pain (eating certain foods, exercise)? °· Are there other symptoms associated with the pain (fever, nausea, vomiting, diarrhea)? °All of this is helpful to know when trying to find the cause of the pain. °CAUSES  °· Stomach: virus or bacteria infection, or ulcer. °· Intestine: appendicitis (inflamed appendix), regional ileitis (Crohn's disease), ulcerative colitis (inflamed colon), irritable bowel syndrome, diverticulitis (inflamed diverticulum of the colon), or cancer of the stomach or intestine. °· Gallbladder disease or stones in the gallbladder. °· Kidney disease, kidney stones, or infection. °· Pancreas infection or cancer. °· Fibromyalgia (pain disorder). °· Diseases of the female organs: °¨ Uterus: fibroid (non-cancerous) tumors or infection. °¨ Fallopian tubes: infection or tubal pregnancy. °¨ Ovary: cysts or tumors. °¨ Pelvic adhesions (scar tissue). °¨ Endometriosis (uterus lining tissue growing in the pelvis and on the pelvic organs). °¨ Pelvic congestion syndrome (female organs filling up with blood just before the menstrual period). °¨ Pain with the menstrual period. °¨ Pain with ovulation (producing an egg). °¨ Pain with an IUD (intrauterine device, birth control) in the uterus. °¨ Cancer of the female organs. °· Functional pain (pain not caused by a disease, may improve without treatment). °· Psychological pain. °· Depression. °DIAGNOSIS  °Your doctor will decide the seriousness of your pain by doing an examination. °· Blood tests. °· X-rays. °· Ultrasound. °· CT scan (computed tomography, special type of X-ray). °· MRI (magnetic resonance imaging). °· Cultures, for infection. °· Barium enema (dye inserted in the large intestine, to better view it with  X-rays). °· Colonoscopy (looking in intestine with a lighted tube). °· Laparoscopy (minor surgery, looking in abdomen with a lighted tube). °· Major abdominal exploratory surgery (looking in abdomen with a large incision). °TREATMENT  °The treatment will depend on the cause of the pain.  °· Many cases can be observed and treated at home. °· Over-the-counter medicines recommended by your caregiver. °· Prescription medicine. °· Antibiotics, for infection. °· Birth control pills, for painful periods or for ovulation pain. °· Hormone treatment, for endometriosis. °· Nerve blocking injections. °· Physical therapy. °· Antidepressants. °· Counseling with a psychologist or psychiatrist. °· Minor or major surgery. °HOME CARE INSTRUCTIONS  °· Do not take laxatives, unless directed by your caregiver. °· Take over-the-counter pain medicine only if ordered by your caregiver. Do not take aspirin because it can cause an upset stomach or bleeding. °· Try a clear liquid diet (broth or water) as ordered by your caregiver. Slowly move to a bland diet, as tolerated, if the pain is related to the stomach or intestine. °· Have a thermometer and take your temperature several times a day, and record it. °· Bed rest and sleep, if it helps the pain. °· Avoid sexual intercourse, if it causes pain. °· Avoid stressful situations. °· Keep your follow-up appointments and tests, as your caregiver orders. °· If the pain does not go away with medicine or surgery, you may try: °¨ Acupuncture. °¨ Relaxation exercises (yoga, meditation). °¨ Group therapy. °¨ Counseling. °SEEK MEDICAL CARE IF:  °· You notice certain foods cause stomach pain. °· Your home care treatment is not helping your pain. °· You need stronger pain medicine. °· You want your IUD removed. °· You feel faint or   lightheaded. °· You develop nausea and vomiting. °· You develop a rash. °· You are having side effects or an allergy to your medicine. °SEEK IMMEDIATE MEDICAL CARE IF:  °· Your  pain does not go away or gets worse. °· You have a fever. °· Your pain is felt only in portions of the abdomen. The right side could possibly be appendicitis. The left lower portion of the abdomen could be colitis or diverticulitis. °· You are passing blood in your stools (bright red or black tarry stools, with or without vomiting). °· You have blood in your urine. °· You develop chills, with or without a fever. °· You pass out. °MAKE SURE YOU:  °· Understand these instructions. °· Will watch your condition. °· Will get help right away if you are not doing well or get worse. °Document Released: 07/25/2007 Document Revised: 02/11/2014 Document Reviewed: 08/14/2009 °[employer]® Patient Information ©2015 [employer]. This information is not intended to replace advice given to you by your health care provider. Make sure you discuss any questions you have with your health care provider. ° °

## 2014-12-05 NOTE — ED Notes (Signed)
MD at bedside. 

## 2014-12-05 NOTE — ED Provider Notes (Signed)
Patient's CT shows a normal appendix. Patient still in pain, requesting more IV pain meds. Due to this, US obtained to r/o torsion.  Ultrasound shows no acute pathology. Shows masses as seen on CT scan. She has an MRI tomorrow to further delineate her pelvis pathology. At this time her pain is better and she is stable for discharge.  Ephraim Hamburger, MD 12/05/14 [phone]

## 2014-12-05 NOTE — ED Provider Notes (Addendum)
CSN: [id number]     Arrival date & time 12/05/14  0546 History   First MD Initiated Contact with Patient 12/05/14 [phone]     Chief Complaint  Patient presents with  . Abdominal Pain     (Consider location/radiation/quality/duration/timing/severity/associated sxs/prior Treatment) Patient is a 44 y.o. female presenting with abdominal pain. The history is provided by the patient.  Abdominal Pain She has been having right lower abdominal pain which has been blamed on an ovarian cyst and she is scheduled to have an MRI to evaluate this tomorrow. Last night, at about 10 PM, she started having worsening pain in the right lower quadrant with associated nausea and vomiting. She denies fever, chills, sweats she denies urinary urgency, frequency, tenesmus, dysuria. She denies any vaginal bleeding. She tried taking tramadol 100 mg with no relief. Pain is worse if she lays flat and better she gets into a kneeling position.  Past Medical History  Diagnosis Date  . Substance abuse     cocaine, marijuana and alcohol abuse. quit end of 2012  . Asthma   . Depression   . COPD (chronic obstructive pulmonary disease)   . IBS (irritable bowel syndrome)   . Obesity   . Hypertension    Past Surgical History  Procedure Laterality Date  . Knee surgery    . Tubal ligation     Family History  Problem Relation Age of Onset  . Hypertension Mother   . Cancer Mother     colon  . Heart disease Father     heart failure  . Diabetes Father    History  Substance Use Topics  . Smoking status: Former Smoker -- 0.25 packs/day    Types: Cigarettes    Quit date: 03/11/2012  . Smokeless tobacco: Never Used  . Alcohol Use: Yes     Comment: once a month   OB History    Gravida Para Term Preterm AB TAB SAB Ectopic Multiple Living   5 3 3  2 2    3      Review of Systems  Gastrointestinal: Positive for abdominal pain.  All other systems reviewed and are negative.     Allergies  Lisinopril  Home  Medications   Prior to Admission medications   Medication Sig Start Date End Date Taking? Authorizing Provider  albuterol (PROVENTIL HFA;VENTOLIN HFA) 108 (90 BASE) MCG/ACT inhaler Inhale 2 puffs into the lungs every 6 (six) hours as needed for wheezing. 05/18/12 06/23/14  Kandis Nab, MD  albuterol (PROVENTIL HFA;VENTOLIN HFA) 108 (90 BASE) MCG/ACT inhaler Inhale 1 puff into the lungs every 6 (six) hours as needed for wheezing or shortness of breath.    Historical Provider, MD  albuterol (PROVENTIL) (2.5 MG/3ML) 0.083% nebulizer solution Take 2.5 mg by nebulization every 6 (six) hours as needed for wheezing or shortness of breath.     Historical Provider, MD  ALPRAZolam Duanne Moron) 1 MG tablet Take 1 tablet (1 mg total) by mouth at bedtime as needed for anxiety (pt to tak e1 hour prior to MRI). 11/07/14   Lavonia Drafts, MD  hydrochlorothiazide (MICROZIDE) 12.5 MG capsule Take 12.5 mg by mouth daily.    Historical Provider, MD  HYDROcodone-acetaminophen (NORCO/VICODIN) 5-325 MG per tablet Take 2 tablets by mouth every 4 (four) hours as needed for moderate pain or severe pain. Patient not taking: Reported on 09/18/2014 06/23/14   Domenic Moras, PA-C  ibuprofen (ADVIL,MOTRIN) 600 MG tablet Take 1 tablet (600 mg total) by mouth every 8 (eight) hours as  needed. Patient taking differently: Take 600 mg by mouth every 8 (eight) hours as needed for moderate pain.  11/04/14   Lavonia Drafts, MD  Melatonin 3 MG TABS Take 1 tablet by mouth at bedtime.    Historical Provider, MD  metFORMIN (GLUCOPHAGE) 500 MG tablet Take 1 tablet (500 mg total) by mouth 2 (two) times daily with a meal. 11/08/14   Carrie Mew, PA-C  metoprolol (LOPRESSOR) 50 MG tablet Take 50 mg by mouth 2 (two) times daily.    Historical Provider, MD  omeprazole (PRILOSEC) 20 MG capsule Take 1 capsule (20 mg total) by mouth daily. Patient not taking: Reported on 09/18/2014 06/23/14   Domenic Moras, PA-C  QUEtiapine (SEROQUEL) 50 MG tablet  Take 50 mg by mouth at bedtime.    Historical Provider, MD  ranitidine (ZANTAC) 150 MG tablet Take 150 mg by mouth 3 (three) times daily.    Historical Provider, MD  traMADol (ULTRAM) 50 MG tablet Take 1 tablet (50 mg total) by mouth every 6 (six) hours as needed. Patient not taking: Reported on 11/08/2014 11/07/14   Lavonia Drafts, MD   BP 166/84 mmHg  Pulse 80  Resp 24  Ht 5\' 3"  (1.6 m)  Wt 314 lb (142.429 kg)  BMI 55.64 kg/m2  SpO2 98%  LMP 09/16/2014 Physical Exam  Nursing note and vitals reviewed.  Morbidly obese 44 year old female, who appears to be in pain, but is in no acute distress. Vital signs are significant for hypertension and tachypnea. Oxygen saturation is 98%, which is normal. Head is normocephalic and atraumatic. PERRLA, EOMI. Oropharynx is clear. Neck is nontender and supple without adenopathy or JVD. Back is nontender and there is no CVA tenderness. Lungs are clear without rales, wheezes, or rhonchi. Chest is nontender. Heart has regular rate and rhythm without murmur. Abdomen is obese, soft, with moderate right lower quadrant tenderness. There is no rebound or guarding. There are no masses or hepatosplenomegaly and peristalsis is normoactive. Extremities have no cyanosis or edema, full range of motion is present. Skin is warm and dry without rash. Neurologic: Mental status is normal, cranial nerves are intact, there are no motor or sensory deficits.  ED Course  Procedures (including critical care time) Labs Review Results for orders placed or performed during the hospital encounter of 12/05/14  Comprehensive metabolic panel  Result Value Ref Range   Sodium 135 135 - 145 mmol/L   Potassium 4.2 3.5 - 5.1 mmol/L   Chloride 102 96 - 112 mmol/L   CO2 19 19 - 32 mmol/L   Glucose, Bld 364 (H) 70 - 99 mg/dL   BUN 9 6 - 23 mg/dL   Creatinine, Ser 0.83 0.50 - 1.10 mg/dL   Calcium 9.1 8.4 - 10.5 mg/dL   Total Protein 7.9 6.0 - 8.3 g/dL   Albumin 4.4 3.5 -  5.2 g/dL   AST 23 0 - 37 U/L   ALT 18 0 - 35 U/L   Alkaline Phosphatase 107 39 - 117 U/L   Total Bilirubin 0.8 0.3 - 1.2 mg/dL   GFR calc non Af Amer 85 (L) >90 mL/min   GFR calc Af Amer >90 >90 mL/min   Anion gap 14 5 - 15  Lipase, blood  Result Value Ref Range   Lipase 20 11 - 59 U/L  CBC with Differential  Result Value Ref Range   WBC 10.8 (H) 4.0 - 10.5 K/uL   RBC 4.78 3.87 - 5.11 MIL/uL   Hemoglobin 14.1  12.0 - 15.0 g/dL   HCT 42.1 36.0 - 46.0 %   MCV 88.1 78.0 - 100.0 fL   MCH 29.5 26.0 - 34.0 pg   MCHC 33.5 30.0 - 36.0 g/dL   RDW 13.3 11.5 - 15.5 %   Platelets 403 (H) 150 - 400 K/uL   Neutrophils Relative % 70 43 - 77 %   Neutro Abs 7.5 1.7 - 7.7 K/uL   Lymphocytes Relative 25 12 - 46 %   Lymphs Abs 2.7 0.7 - 4.0 K/uL   Monocytes Relative 4 3 - 12 %   Monocytes Absolute 0.5 0.1 - 1.0 K/uL   Eosinophils Relative 0 0 - 5 %   Eosinophils Absolute 0.0 0.0 - 0.7 K/uL   Basophils Relative 1 0 - 1 %   Basophils Absolute 0.1 0.0 - 0.1 K/uL  Urinalysis, Routine w reflex microscopic  Result Value Ref Range   Color, Urine YELLOW YELLOW   APPearance HAZY (A) CLEAR   Specific Gravity, Urine 1.020 1.005 - 1.030   pH 5.0 5.0 - 8.0   Glucose, UA >1000 (A) NEGATIVE mg/dL   Hgb urine dipstick LARGE (A) NEGATIVE   Bilirubin Urine NEGATIVE NEGATIVE   Ketones, ur 40 (A) NEGATIVE mg/dL   Protein, ur NEGATIVE NEGATIVE mg/dL   Urobilinogen, UA 0.2 0.0 - 1.0 mg/dL   Nitrite NEGATIVE NEGATIVE   Leukocytes, UA NEGATIVE NEGATIVE  Pregnancy, urine  Result Value Ref Range   Preg Test, Ur NEGATIVE NEGATIVE  Urine microscopic-add on  Result Value Ref Range   Squamous Epithelial / LPF FEW (A) RARE   RBC / HPF TOO NUMEROUS TO COUNT <3 RBC/hpf    MDM   Final diagnoses:  Abdominal pain, unspecified abdominal location    Exacerbation of right lower abdominal pain. Old records are reviewed and she had an ultrasound several months ago which showed cystic masses in both ovaries. Back  further, she had a CT scan in 2009 which also showed cystic masses in both ovaries. At this point, I wish to be sure that other pathology is not overlooked. She will be sent for CT of abdomen and pelvis. IV fluids will be given as well as ondansetron for nausea and hydromorphone for pain.  She had good pain relief from hydromorphone. Laboratory workup is unremarkable. Glucose is moderately elevated at 380. CT scan is pending. Case is signed out to Dr. Regenia Skeeter to evaluate CT report. Consider pelvic ultrasound to rule out ovarian torsion if CT does not demonstrate obvious cause for pain.  Delora Fuel, MD 43/15/40 [id number]  Delora Fuel, MD 61/95/09 [id number]

## 2014-12-05 NOTE — ED Notes (Signed)
Pt reporting RLQ abdominal pain due to ovarian cysts.  Pt states that she frequently has this pain, and is supposed to have an MRI of ovaries today.  Pt states she took 2 tramadol about 10:30 last night with minimal relief.  Pt also reporting associated nausea.

## 2014-12-06 ENCOUNTER — Ambulatory Visit (HOSPITAL_COMMUNITY)
Admission: RE | Admit: 2014-12-06 | Discharge: 2014-12-06 | Disposition: A | Discharge status: Home / Self Care | Payer: Self-pay | Source: Ambulatory Visit | Attending: Obstetrics & Gynecology | Admitting: Obstetrics & Gynecology

## 2014-12-06 ENCOUNTER — Other Ambulatory Visit: Payer: Self-pay | Admitting: Obstetrics & Gynecology

## 2014-12-06 DIAGNOSIS — N9489 Other specified conditions associated with female genital organs and menstrual cycle: Secondary | ICD-10-CM

## 2014-12-06 DIAGNOSIS — N949 Unspecified condition associated with female genital organs and menstrual cycle: Secondary | ICD-10-CM | POA: Insufficient documentation

## 2014-12-06 DIAGNOSIS — D251 Intramural leiomyoma of uterus: Secondary | ICD-10-CM | POA: Insufficient documentation

## 2014-12-06 DIAGNOSIS — D252 Subserosal leiomyoma of uterus: Secondary | ICD-10-CM | POA: Insufficient documentation

## 2014-12-06 MED ORDER — GADOBENATE DIMEGLUMINE 529 MG/ML IV SOLN: 20.0000 mL | Freq: Once | INTRAVENOUS | Status: AC | PRN

## 2014-12-09 ENCOUNTER — Telehealth: Payer: Self-pay | Admitting: *Deleted

## 2014-12-09 NOTE — Telephone Encounter (Signed)
Patient requesting results of MRI and plan of care. Dr. Eugenie Norrie in clinic this afternoon, will ask her to review it and make plan.

## 2014-12-09 NOTE — Telephone Encounter (Signed)
Dr Ihor Dow called patient and addressed concerns

## 2014-12-10 ENCOUNTER — Telehealth: Payer: Self-pay | Admitting: Obstetrics & Gynecology

## 2014-12-10 NOTE — Telephone Encounter (Signed)
I spoke to pt yesterday about her MRI results. I have recommended referral to [facility] for further eval and surgical management.  Pt reports that she was recently hospitalized due to increased pain.  She will keep the appt with Dr Denman George.  Aliene Tamura L. Harraway-Smith, M.D., Cherlynn June

## 2014-12-16 ENCOUNTER — Ambulatory Visit: Payer: No Typology Code available for payment source | Attending: Gynecologic Oncology | Admitting: Gynecologic Oncology

## 2014-12-16 ENCOUNTER — Encounter: Payer: Self-pay | Admitting: Gynecologic Oncology

## 2014-12-16 ENCOUNTER — Ambulatory Visit: Payer: No Typology Code available for payment source

## 2014-12-16 VITALS — BP 170/107 | HR 82 | Temp 98.2°F | Resp 20 | Ht 63.5 in | Wt 318.5 lb

## 2014-12-16 DIAGNOSIS — Z87891 Personal history of nicotine dependence: Secondary | ICD-10-CM | POA: Insufficient documentation

## 2014-12-16 DIAGNOSIS — E119 Type 2 diabetes mellitus without complications: Secondary | ICD-10-CM | POA: Insufficient documentation

## 2014-12-16 DIAGNOSIS — E1165 Type 2 diabetes mellitus with hyperglycemia: Secondary | ICD-10-CM

## 2014-12-16 DIAGNOSIS — I10 Essential (primary) hypertension: Secondary | ICD-10-CM | POA: Insufficient documentation

## 2014-12-16 DIAGNOSIS — J449 Chronic obstructive pulmonary disease, unspecified: Secondary | ICD-10-CM | POA: Insufficient documentation

## 2014-12-16 DIAGNOSIS — K429 Umbilical hernia without obstruction or gangrene: Secondary | ICD-10-CM | POA: Insufficient documentation

## 2014-12-16 DIAGNOSIS — J45909 Unspecified asthma, uncomplicated: Secondary | ICD-10-CM | POA: Insufficient documentation

## 2014-12-16 DIAGNOSIS — D259 Leiomyoma of uterus, unspecified: Secondary | ICD-10-CM | POA: Insufficient documentation

## 2014-12-16 DIAGNOSIS — Z9049 Acquired absence of other specified parts of digestive tract: Secondary | ICD-10-CM | POA: Insufficient documentation

## 2014-12-16 DIAGNOSIS — N83202 Unspecified ovarian cyst, left side: Secondary | ICD-10-CM

## 2014-12-16 DIAGNOSIS — Z6841 Body Mass Index (BMI) 40.0 and over, adult: Secondary | ICD-10-CM | POA: Insufficient documentation

## 2014-12-16 DIAGNOSIS — N83201 Unspecified ovarian cyst, right side: Secondary | ICD-10-CM | POA: Insufficient documentation

## 2014-12-16 DIAGNOSIS — N8320 Unspecified ovarian cysts: Secondary | ICD-10-CM | POA: Insufficient documentation

## 2014-12-16 LAB — HEMOGLOBIN A1C
Hgb A1c MFr Bld: 14.1 % — ABNORMAL HIGH (ref <5.7)
Mean Plasma Glucose: 358 mg/dL — ABNORMAL HIGH (ref <117)

## 2014-12-16 NOTE — Progress Notes (Signed)
Consult Note: Gyn-Onc  Consult was requested by Dr. Ihor Dow for the evaluation of Camaria Gerald 44 y.o. female with class III morbid obesity, poorly controlled diabetes mellitus and bilateral ovarian cysts  CC:  Chief Complaint  Patient presents with  . Ovarian Cyst    Assessment/Plan:  Ms. Andreka Stucki  is a 44 y.o.  year old with bilateral complex ovarian masses. Sclerae reviewed the films for CT scan and MRI. The patient's CA-125 on 04/08/2015 is normal at 8 units per milliliter. My impression from reviewing the CT scan, MRI, ultrasound and CA-125 is that these masses most likely represent benign phenomenon. The pain that the patient is having in her abdomen is long-standing and is only mildly exacerbated recently and I do not believe that it is likely be secondary to these masses. The patient has multiple medical poorly controlled comorbidities and has no established primary care physician. She is not a surgical candidate this time particular given that you do not believe she has an underlying malignancy.  I discussed with the patient that I would not perform surgery until she has established a primary care physician, lost approximately 100 pounds, and established an HbA1c of less than 7. Of note her HbA1c in January 2016 was 14.3%. I discussed with the patient that she faces an extremely limited life expectancy (I anticipate less than 10 years) secondary to her morbid obesity, poorly controlled diabetes, and hypertension. I discussed that these conditions, which are not medically managed at all at present, are life-threatening and her ovarian cysts are not. I discussed that the surgery performed at this time with such poorly controlled hypertension diabetes and obesity or result in certain comorbidities some of which may be life-threatening. I discussed the technical limitations to surgical exposure and the risk for damage to internal organs when operating on extremely obese patients.  I described the impaired wound healing in patients with poorly controlled diabetes and she has certain risk for wound dehiscence and cellulitis. I discussed anesthetic risks including loss of airway and hypertensive emergency for which she is at particularly high risk given her body habitus and uncontrolled hypertension.  The patient reports that she is currently in the process of losing weight with strategies of calorie reduction. I encouraged her to continue this. I encouraged her to establish care with a primary care physician, and the patient has plans to contact the [facility] to establish this. We will recheck her HbA1c today and use this as a marker. I reiterated that my goal is of value of less than 7 before I believe that an elective surgery is appropriate. It is likely that she will require prescription beyond her current metformin prescription in order to control her sugars. I discussed with the patient that I am a cancer his surgeon and not a diabetic specialist to not qualified to manage her hyperglycemia.  I will see her back in 2 months time to repeat HbA1c evaluation and weight loss goals. I discussed with the patient that I will not prescribe narcotic analgesia for her menstrual pain.   HPI: Roux Brandy is a 44 year old gravida 5 para 3 who is seen in consultation at the request of Dr Ihor Dow for bilateral complex ovarian cysts. The patient has cluster morbid obesity with a BMI greater than 55 kg/m. She has a extremely poorly controlled diabetes mellitus with a recent random blood glucose of greater than 500, and an HbA1c drawn in late January 2016 but was 14.3%. She has untreated essential  hypertension.  The patient does not have a primary care doctor and seeks consultations in the emergency room or urgent care clinics for exacerbations of symptoms such as abdominal pelvic pain. She reports having bilateral intermittent pelvic pain "for years". This became  slightly worse since August 2016. She was seen in the emergency room at [facility] in [place] on 11/06/2014 at which time she was presenting with abnormal uterine bleeding and underwent an ultrasound that showed a uterus measuring 11.4 x 5.7 x 6.5 cm with fibroids, a 4 mm endometrial thickness, a right ovary measuring 11.7 x 8.6 x 10.2 cm with a cystic mass and a 2.7 cm mural nodule. The left ovary measures 11.4 x 7.2 x 9.7 cm and included a solid-appearing mass with peripheral vascularity. There was no free fluid. This ultrasound scan was repeated on February25th 2016 and impressions were that the ovarian masses were stable with the right measuring approximately 11 cm in greatest dimension the left measuring 8cm in greatest dimension. Uterine fibroids was suspected.   A CT of the abdomen and pelvis was performed [facility] after emergency room visit for pain on February 25th 2016 and this confirmed a large para umbilical hernia with fat extending through a 6.7 cm defect. There was a hypodense homogeneous mass in the left lateral abdomen at the level of the umbilicus umbilicus measuring 6.8 x 5.7 x 7.8 cm. This did not appear to be related to the adnexa. There was 6 separate adnexal masses noted.   An MR of the pelvis was performed on 05/06/2015 and revealed an enlarged fibroid uterus measuring 10.2 x 9.1 x 13.6 cm. Within the left adnexa there was a cyst measuring 6.5 x 6.5 cm. There is no solid nodular component internal septation identified. The impressions were large right adnexal cyst. There is a small left adnexal cyst also noted. It is likely that some of the other masses noted on prior scans were in fact fibroids that were better delineated by this MR.   Interval History: the patient reports intermenstrual bleeding.appearance themselves quite light. She experiences exacerbation of her pelvic pain during her menstrual periods. 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  Current Meds:  Outpatient Encounter Prescriptions as of 12/16/2014  Medication Sig  . albuterol (PROVENTIL HFA;VENTOLIN HFA) 108 (90 BASE) MCG/ACT inhaler Inhale 2 puffs into the lungs every 6 (six) hours as needed for wheezing.  [name] albuterol (PROVENTIL) (2.5 MG/3ML) 0.083% nebulizer solution Take 2.5 mg by nebulization every 6 (six) hours as needed for wheezing or shortness of breath.   . clonazePAM (KLONOPIN) 0.5 MG tablet Take 0.5 mg by mouth at bedtime as needed (sleep).  . metFORMIN (GLUCOPHAGE) 500 MG tablet Take 1 tablet (500 mg total) by mouth 2 (two) times daily with a meal.  . metoprolol (LOPRESSOR) 50 MG tablet Take 50 mg by mouth 2 (two) times daily.  [name] HYDROcodone-acetaminophen (NORCO) 5-325 MG per tablet Take 1 tablet by mouth every 6 (six) hours as needed for severe pain. (Patient not taking: Reported on 12/16/2014)  . [DISCONTINUED] albuterol (PROVENTIL HFA;VENTOLIN HFA) 108 (90 BASE) MCG/ACT inhaler Inhale 1 puff into the lungs every 6 (six) hours as needed for wheezing or shortness of breath.  . [DISCONTINUED] ALPRAZolam (XANAX) 1 MG tablet Take 1 tablet (1 mg total) by mouth at bedtime as needed for anxiety (pt to tak e1 hour prior to MRI). (Patient not taking: Reported on 12/16/2014)  . [DISCONTINUED] ibuprofen (ADVIL,MOTRIN) 600  MG tablet Take 1 tablet (600 mg total) by mouth every 8 (eight) hours as needed. (Patient not taking: Reported on 12/16/2014)  . [DISCONTINUED] Melatonin 3 MG TABS Take 1 tablet by mouth at bedtime as needed (sleep (alternates with clonazepam)).   . [DISCONTINUED] omeprazole (PRILOSEC) 20 MG capsule Take 1 capsule (20 mg total) by mouth daily. (Patient not taking: Reported on 09/18/2014)  . [DISCONTINUED] traMADol (ULTRAM) 50 MG tablet Take 1 tablet (50 mg total) by mouth every 6 (six) hours as needed. (Patient not taking: Reported on 11/08/2014)    Allergy:  Allergies  Allergen Reactions  . Lisinopril Swelling    Social Hx:   History   Social  History  . Marital Status: Single    Spouse Name: N/A  . Number of Children: N/A  . Years of Education: N/A   Occupational History  . Not on file.   Social History Main Topics  . Smoking status: Former Smoker -- 0.25 packs/day    Types: Cigarettes    Quit date: 03/11/2012  . Smokeless tobacco: Never Used  . Alcohol Use: Yes     Comment: once a month  . Drug Use: No  . Sexual Activity: Not Currently   Other Topics Concern  . Not on file   Social History Narrative    Past Surgical Hx:  Past Surgical History  Procedure Laterality Date  . Knee surgery    . Tubal ligation    . Cholecystectomy      Past Medical Hx:  Past Medical History  Diagnosis Date  . Substance abuse     cocaine, marijuana and alcohol abuse. quit end of 2012  . Asthma   . Depression   . COPD (chronic obstructive pulmonary disease)   . IBS (irritable bowel syndrome)   . Obesity   . Hypertension     Past Gynecological History:  Verline Lema, cs x2, [id number]  No LMP recorded.  Family Hx:  Family History  Problem Relation Age of Onset  . Hypertension Mother   . Cancer Mother     colon  . Heart disease Father     heart failure  . Diabetes Father     Review of Systems:  Constitutional  Feels well,   ENT Normal appearing ears and nares bilaterally Skin/Breast  No rash, sores, jaundice, itching, dryness Cardiovascular  No chest pain, shortness of breath, or edema  Pulmonary  No cough or wheeze.  Gastro Intestinal  No nausea, vomitting, or diarrhoea. No bright red blood per rectum, no abdominal pain, change in bowel movement, or constipation.  Genito Urinary  No frequency, urgency, dysuria, Musculo Skeletal  No myalgia, arthralgia, joint swelling or pain  Neurologic  No weakness, numbness, change in gait,  Psychology  No depression, anxiety, insomnia.   Vitals:  Blood pressure 170/107, pulse 82, temperature 98.2 F (36.8 C), temperature source Oral, resp. rate 20, height 5' 3.5" (1.613  m), weight 318 lb 8 oz (144.471 kg).  Physical Exam: WD in NAD Neck  Supple NROM, without any enlargements.  Lymph Node Survey No cervical supraclavicular or inguinal adenopathy Cardiovascular  Pulse normal rate, regularity and rhythm. S1 and S2 normal.  Lungs  Clear to auscultation bilateraly, without wheezes/crackles/rhonchi. Good air movement.  Skin  No rash/lesions/breakdown  Psychiatry  Alert and oriented to person, place, and time  Abdomen  Normoactive bowel sounds, abdomen soft, non-tender and extremely obese without evidence of hernia.  Back No CVA tenderness Genito Urinary  Vulva/vagina: Normal external female  genitalia.  No lesions. No discharge or bleeding.  Bladder/urethra:  No lesions or masses, well supported bladder  Vagina: normal in appearance  Cervix: Normal appearing, no lesions.  Uterus: bulky, mobile, no parametrial involvement or nodularity. Limited exam secondary to body habitus  Adnexa: no discretely palpabe masses. However exam limited secondary to body habitus. Rectal  Good tone, no masses no cul de sac nodularity.  Extremities  No bilateral cyanosis, clubbing or edema.   Donaciano Eva, MD   12/16/2014, 3:36 PM

## 2014-12-18 ENCOUNTER — Telehealth: Payer: Self-pay | Admitting: *Deleted

## 2014-12-18 NOTE — Telephone Encounter (Signed)
Per Joylene John, NP patient notified of Hemoglobin A1C from 12/16/14. Told patient the value was 14.1 which is over double the normal value. Patient states she is set up for an appt at [facility] to establish care on 12/25/14. Encouraged patient to definitely keep that appointment.   Patient states she has gotten a [employer] and is trying to become more active. Encouraged patient to keep a food diary over the next few weeks and to bring it to her doctor appt on 12/25/14 for the doctor to review - patient is agreeable to this.

## 2014-12-25 ENCOUNTER — Ambulatory Visit: Payer: No Typology Code available for payment source | Attending: Internal Medicine | Admitting: Internal Medicine

## 2014-12-25 ENCOUNTER — Telehealth: Payer: Self-pay | Admitting: Internal Medicine

## 2014-12-25 ENCOUNTER — Encounter: Payer: Self-pay | Admitting: Internal Medicine

## 2014-12-25 VITALS — BP 137/94 | HR 85 | Temp 98.0°F | Resp 16 | Wt 313.2 lb

## 2014-12-25 DIAGNOSIS — G4700 Insomnia, unspecified: Secondary | ICD-10-CM | POA: Insufficient documentation

## 2014-12-25 DIAGNOSIS — J45909 Unspecified asthma, uncomplicated: Secondary | ICD-10-CM | POA: Insufficient documentation

## 2014-12-25 DIAGNOSIS — I10 Essential (primary) hypertension: Secondary | ICD-10-CM | POA: Insufficient documentation

## 2014-12-25 DIAGNOSIS — Z139 Encounter for screening, unspecified: Secondary | ICD-10-CM

## 2014-12-25 DIAGNOSIS — E139 Other specified diabetes mellitus without complications: Secondary | ICD-10-CM

## 2014-12-25 DIAGNOSIS — E119 Type 2 diabetes mellitus without complications: Secondary | ICD-10-CM | POA: Insufficient documentation

## 2014-12-25 LAB — POCT URINALYSIS DIPSTICK
BILIRUBIN UA: NEGATIVE
Glucose, UA: 500
KETONES UA: NEGATIVE
LEUKOCYTES UA: NEGATIVE
Nitrite, UA: NEGATIVE
PROTEIN UA: NEGATIVE
RBC UA: NEGATIVE
Spec Grav, UA: 1.025
Urobilinogen, UA: 0.2
pH, UA: 5.5

## 2014-12-25 LAB — GLUCOSE, POCT (MANUAL RESULT ENTRY): POC GLUCOSE: 327 mg/dL — AB (ref 70–99)

## 2014-12-25 MED ORDER — METFORMIN HCL 1000 MG PO TABS: 1000.0000 mg | ORAL_TABLET | Freq: Two times a day (BID) | ORAL | Status: DC

## 2014-12-25 MED ORDER — FREESTYLE SYSTEM KIT: 1.0000 | PACK | Freq: Three times a day (TID) | Status: DC

## 2014-12-25 MED ORDER — METOPROLOL TARTRATE 50 MG PO TABS: 50.0000 mg | ORAL_TABLET | Freq: Two times a day (BID) | ORAL | Status: DC

## 2014-12-25 MED ORDER — ALBUTEROL SULFATE HFA 108 (90 BASE) MCG/ACT IN AERS: 2.0000 | INHALATION_SPRAY | Freq: Four times a day (QID) | RESPIRATORY_TRACT | Status: DC | PRN

## 2014-12-25 NOTE — Telephone Encounter (Signed)
Pt called requesting a refill for albuterol (PROVENTIL) (2.5 MG/3ML) 0.083% nebulizer solution, eczema cream and loratadine. Please f/u with pt

## 2014-12-25 NOTE — Patient Instructions (Signed)
DASH Eating Plan DASH stands for "Dietary Approaches to Stop Hypertension." The DASH eating plan is a healthy eating plan that has been shown to reduce high blood pressure (hypertension). Additional health benefits may include reducing the risk of type 2 diabetes mellitus, heart disease, and stroke. The DASH eating plan may also help with weight loss. WHAT DO I NEED TO KNOW ABOUT THE DASH EATING PLAN? For the DASH eating plan, you will follow these general guidelines:  Choose foods with a percent daily value for sodium of less than 5% (as listed on the food label).  Use salt-free seasonings or herbs instead of table salt or sea salt.  Check with your health care provider or pharmacist before using salt substitutes.  Eat lower-sodium products, often labeled as "lower sodium" or "no salt added."  Eat fresh foods.  Eat more vegetables, fruits, and low-fat dairy products.  Choose whole grains. Look for the word "whole" as the first word in the ingredient list.  Choose fish and skinless chicken or turkey more often than red meat. Limit fish, poultry, and meat to 6 oz (170 g) each day.  Limit sweets, desserts, sugars, and sugary drinks.  Choose heart-healthy fats.  Limit cheese to 1 oz (28 g) per day.  Eat more home-cooked food and less restaurant, buffet, and fast food.  Limit fried foods.  Cook foods using methods other than frying.  Limit canned vegetables. If you do use them, rinse them well to decrease the sodium.  When eating at a restaurant, ask that your food be prepared with less salt, or no salt if possible. WHAT FOODS CAN I EAT? Seek help from a dietitian for individual calorie needs. Grains Whole grain or whole wheat bread. Brown rice. Whole grain or whole wheat pasta. Quinoa, bulgur, and whole grain cereals. Low-sodium cereals. Corn or whole wheat flour tortillas. Whole grain cornbread. Whole grain crackers. Low-sodium crackers. Vegetables Fresh or frozen vegetables  (raw, steamed, roasted, or grilled). Low-sodium or reduced-sodium tomato and vegetable juices. Low-sodium or reduced-sodium tomato sauce and paste. Low-sodium or reduced-sodium canned vegetables.  Fruits All fresh, canned (in natural juice), or frozen fruits. Meat and Other Protein Products Ground beef (85% or leaner), grass-fed beef, or beef trimmed of fat. Skinless chicken or turkey. Ground chicken or turkey. Pork trimmed of fat. All fish and seafood. Eggs. Dried beans, peas, or lentils. Unsalted nuts and seeds. Unsalted canned beans. Dairy Low-fat dairy products, such as skim or 1% milk, 2% or reduced-fat cheeses, low-fat ricotta or cottage cheese, or plain low-fat yogurt. Low-sodium or reduced-sodium cheeses. Fats and Oils Tub margarines without trans fats. Light or reduced-fat mayonnaise and salad dressings (reduced sodium). Avocado. Safflower, olive, or canola oils. Natural peanut or almond butter. Other Unsalted popcorn and pretzels. The items listed above may not be a complete list of recommended foods or beverages. Contact your dietitian for more options. WHAT FOODS ARE NOT RECOMMENDED? Grains White bread. White pasta. White rice. Refined cornbread. Bagels and croissants. Crackers that contain trans fat. Vegetables Creamed or fried vegetables. Vegetables in a cheese sauce. Regular canned vegetables. Regular canned tomato sauce and paste. Regular tomato and vegetable juices. Fruits Dried fruits. Canned fruit in light or heavy syrup. Fruit juice. Meat and Other Protein Products Fatty cuts of meat. Ribs, chicken wings, bacon, sausage, bologna, salami, chitterlings, fatback, hot dogs, bratwurst, and packaged luncheon meats. Salted nuts and seeds. Canned beans with salt. Dairy Whole or 2% milk, cream, half-and-half, and cream cheese. Whole-fat or sweetened yogurt. Full-fat   cheeses or blue cheese. Nondairy creamers and whipped toppings. Processed cheese, cheese spreads, or cheese  curds. Condiments Onion and garlic salt, seasoned salt, table salt, and sea salt. Canned and packaged gravies. Worcestershire sauce. Tartar sauce. Barbecue sauce. Teriyaki sauce. Soy sauce, including reduced sodium. Steak sauce. Fish sauce. Oyster sauce. Cocktail sauce. Horseradish. Ketchup and mustard. Meat flavorings and tenderizers. Bouillon cubes. Hot sauce. Tabasco sauce. Marinades. Taco seasonings. Relishes. Fats and Oils Butter, stick margarine, lard, shortening, ghee, and bacon fat. Coconut, palm kernel, or palm oils. Regular salad dressings. Other Pickles and olives. Salted popcorn and pretzels. The items listed above may not be a complete list of foods and beverages to avoid. Contact your dietitian for more information. WHERE CAN I FIND MORE INFORMATION? National Heart, [employer], [employer]: www.nhlbi.nih.gov/health/health-topics/topics/dash/ Document Released: 09/16/2011 Document Revised: 02/11/2014 Document Reviewed: 08/01/2013 ExitCare Patient Information 2015 [employer]. This information is not intended to replace advice given to you by your health care provider. Make sure you discuss any questions you have with your health care provider. Diabetes Mellitus and Food It is important for you to manage your blood sugar (glucose) level. Your blood glucose level can be greatly affected by what you eat. Eating healthier foods in the appropriate amounts throughout the day at about the same time each day will help you control your blood glucose level. It can also help slow or prevent worsening of your diabetes mellitus. Healthy eating may even help you improve the level of your blood pressure and reach or maintain a healthy weight.  HOW CAN FOOD AFFECT ME? Carbohydrates Carbohydrates affect your blood glucose level more than any other type of food. Your dietitian will help you determine how many carbohydrates to eat at each meal and teach you how to count carbohydrates. Counting  carbohydrates is important to keep your blood glucose at a healthy level, especially if you are using insulin or taking certain medicines for diabetes mellitus. Alcohol Alcohol can cause sudden decreases in blood glucose (hypoglycemia), especially if you use insulin or take certain medicines for diabetes mellitus. Hypoglycemia can be a life-threatening condition. Symptoms of hypoglycemia (sleepiness, dizziness, and disorientation) are similar to symptoms of having too much alcohol.  If your health care provider has given you approval to drink alcohol, do so in moderation and use the following guidelines:  Women should not have more than one drink per day, and men should not have more than two drinks per day. One drink is equal to:  12 oz of beer.  5 oz of wine.  1 oz of hard liquor.  Do not drink on an empty stomach.  Keep yourself hydrated. Have water, diet soda, or unsweetened iced tea.  Regular soda, juice, and other mixers might contain a lot of carbohydrates and should be counted. WHAT FOODS ARE NOT RECOMMENDED? As you make food choices, it is important to remember that all foods are not the same. Some foods have fewer nutrients per serving than other foods, even though they might have the same number of calories or carbohydrates. It is difficult to get your body what it needs when you eat foods with fewer nutrients. Examples of foods that you should avoid that are high in calories and carbohydrates but low in nutrients include:  Trans fats (most processed foods list trans fats on the Nutrition Facts label).  Regular soda.  Juice.  Candy.  Sweets, such as cake, pie, doughnuts, and cookies.  Fried foods. WHAT FOODS CAN I EAT? Have nutrient-rich foods,   which will nourish your body and keep you healthy. The food you should eat also will depend on several factors, including:  The calories you need.  The medicines you take.  Your weight.  Your blood glucose level.  Your  blood pressure level.  Your cholesterol level. You also should eat a variety of foods, including:  Protein, such as meat, poultry, fish, tofu, nuts, and seeds (lean animal proteins are best).  Fruits.  Vegetables.  Dairy products, such as milk, cheese, and yogurt (low fat is best).  Breads, grains, pasta, cereal, rice, and beans.  Fats such as olive oil, trans fat-free margarine, canola oil, avocado, and olives. DOES EVERYONE WITH DIABETES MELLITUS HAVE THE SAME MEAL PLAN? Because every person with diabetes mellitus is different, there is not one meal plan that works for everyone. It is very important that you meet with a dietitian who will help you create a meal plan that is just right for you. Document Released: 06/24/2005 Document Revised: 10/02/2013 Document Reviewed: 08/24/2013 ExitCare Patient Information 2015 [employer]. This information is not intended to replace advice given to you by your health care provider. Make sure you discuss any questions you have with your health care provider.  

## 2014-12-25 NOTE — Progress Notes (Signed)
Patient Demographics  Joanna Newman, is a 44 y.o. female  [id number]  GMW:[id number]  DOB - 05-14-1971  CC:  Chief Complaint  Patient presents with  . new patient       HPI: Joanna Newman is a 44 y.o. female here today to establish medical care.patient has history of morbid obesity, diabetes, hypertension, asthma, history of substance abuse, anxiety/insomnia, as per patient she follows up with mental health and is tapering down Klonopin and she was started on new medication for sleep which patient does not remember the name, patient was diagnosed with diabetes a few months ago, today her blood sugar is elevated as per patient she has not been taking her medications she ran out of for more than a week now,patient declines insulin shot, patient does have strong family history of diabetes, I have counseled patient for diet modification and compliance with the medications. Patient has No headache, No chest pain, No abdominal pain - No Nausea, No new weakness tingling or numbness, No Cough - SOB.  Allergies  Allergen Reactions  . Lisinopril Swelling   Past Medical History  Diagnosis Date  . Substance abuse     cocaine, marijuana and alcohol abuse. quit end of 2012  . Asthma   . Depression   . COPD (chronic obstructive pulmonary disease)   . IBS (irritable bowel syndrome)   . Obesity   . Hypertension    Current Outpatient Prescriptions on File Prior to Visit  Medication Sig Dispense Refill  . albuterol (PROVENTIL) (2.5 MG/3ML) 0.083% nebulizer solution Take 2.5 mg by nebulization every 6 (six) hours as needed for wheezing or shortness of breath.     . clonazePAM (KLONOPIN) 0.5 MG tablet Take 0.5 mg by mouth at bedtime as needed (sleep).    [name] HYDROcodone-acetaminophen (NORCO) 5-325 MG per tablet Take 1 tablet by mouth every 6 (six) hours as needed for severe pain. (Patient not taking: Reported on 12/16/2014) 20 tablet 0   No current facility-administered medications on  file prior to visit.   Family History  Problem Relation Age of Onset  . Hypertension Mother   . Cancer Mother     colon  . Heart disease Father     heart failure  . Diabetes Father   . Diabetes Maternal Grandmother   . Diabetes Paternal Grandmother    History   Social History  . Marital Status: Single    Spouse Name: N/A  . Number of Children: N/A  . Years of Education: N/A   Occupational History  . Not on file.   Social History Main Topics  . Smoking status: Former Smoker -- 0.25 packs/day for 15 years    Types: Cigarettes    Quit date: 03/11/2012  . Smokeless tobacco: Never Used  . Alcohol Use: Yes     Comment: once a month  . Drug Use: Yes    Special: Cocaine     Comment:  history of substance abuse   . Sexual Activity: Not Currently   Other Topics Concern  . Not on file   Social History Narrative    Review of Systems: Constitutional: Negative for fever, chills, diaphoresis, activity change, appetite change and fatigue. HENT: Negative for ear pain, nosebleeds, congestion, facial swelling, rhinorrhea, neck pain, neck stiffness and ear discharge.  Eyes: Negative for pain, discharge, redness, itching and visual disturbance. Respiratory: Negative for cough, choking, chest tightness, shortness of breath, wheezing and stridor.  Cardiovascular: Negative for chest pain, palpitations and leg swelling. Gastrointestinal:  Negative for abdominal distention. Genitourinary: Negative for dysuria, urgency, frequency, hematuria, flank pain, decreased urine volume, difficulty urinating and dyspareunia.  Musculoskeletal: Negative for back pain, joint swelling, arthralgia and gait problem. Neurological: Negative for dizziness, tremors, seizures, syncope, facial asymmetry, speech difficulty, weakness, light-headedness, numbness and headaches.  Hematological: Negative for adenopathy. Does not bruise/bleed easily. Psychiatric/Behavioral: Negative for hallucinations, behavioral  problems, confusion, dysphoric mood, decreased concentration and agitation.    Objective:   Filed Vitals:   12/25/14 1102  BP: 137/94  Pulse: 85  Temp: 98 F (36.7 C)  Resp: 16    Physical Exam: Constitutional:obese female sitting comfortably not in acute distress. HENT: Normocephalic, atraumatic, External right and left ear normal. Oropharynx is clear and moist.  Eyes: Conjunctivae and EOM are normal. PERRLA, no scleral icterus. Neck: Normal ROM. Neck supple. No JVD. No tracheal deviation. No thyromegaly. CVS: RRR, S1/S2 +, no murmurs, no gallops, no carotid bruit.  Pulmonary: Effort and breath sounds normal, no stridor, rhonchi, wheezes, rales.  Abdominal: Soft. BS +, no distension, tenderness, rebound or guarding.  Musculoskeletal: Normal range of motion. No edema and no tenderness.  Lymphadenopathy: No lymphadenopathy noted, cervical, inguinal or axillary Neuro: Alert. Normal reflexes, muscle tone coordination. No cranial nerve deficit. Skin: Skin is warm and dry. No rash noted. Not diaphoretic. No erythema. No pallor. Psychiatric: Normal mood and affect. Behavior, judgment, thought content normal.  Lab Results  Component Value Date   WBC 10.8* 12/05/2014   HGB 14.1 12/05/2014   HCT 42.1 12/05/2014   MCV 88.1 12/05/2014   PLT 403* 12/05/2014   Lab Results  Component Value Date   CREATININE 0.83 12/05/2014   BUN 9 12/05/2014   NA 135 12/05/2014   K 4.2 12/05/2014   CL 102 12/05/2014   CO2 19 12/05/2014    Lab Results  Component Value Date   HGBA1C 14.1* 12/16/2014   Lipid Panel  No results found for: CHOL, TRIG, HDL, CHOLHDL, VLDL, LDLCALC     Assessment and plan:   1. Other specified diabetes mellitus without complications Results for orders placed or performed in visit on 12/25/14  Glucose (CBG)  Result Value Ref Range   POC Glucose 327 (A) 70 - 99 mg/dl  Urinalysis Dipstick  Result Value Ref Range   Color, UA yellow    Clarity, UA clear     Glucose, UA 500    Bilirubin, UA neg    Ketones, UA neg    Spec Grav, UA 1.025    Blood, UA neg    pH, UA 5.5    Protein, UA neg    Urobilinogen, UA 0.2    Nitrite, UA neg    Leukocytes, UA Negative    Patient has hyperglycemia, no ketones in the urine, she declines insulin shot, have advised patient for diet modification, she is given referral to diabetes education, also increased the dose of metformin to thousand milligram twice a day, patient will keep the fingerstick log.will come back in 2 weeks for CBG check nurses it. - Glucose (CBG) - Urinalysis Dipstick - glucose monitoring kit (FREESTYLE) monitoring kit; 1 each by Does not apply route 4 (four) times daily - after meals and at bedtime. 1 month Diabetic Testing Supplies for QAC-QHS accuchecks.  Dispense: 1 each; Refill: 1 - metFORMIN (GLUCOPHAGE) 1000 MG tablet; Take 1 tablet (1,000 mg total) by mouth 2 (two) times daily with a meal.  Dispense: 60 tablet; Refill: 3 - Ambulatory referral to diabetic education  2. Essential hypertension Resume  back on metoprolol. Also advise for DASH diet - metoprolol (LOPRESSOR) 50 MG tablet; Take 1 tablet (50 mg total) by mouth 2 (two) times daily.  Dispense: 60 tablet; Refill: 3  3. Insomnia/anxiety Currently patient following up with psychiatry.  4. Asthma, chronic, unspecified asthma severity, uncomplicated  - albuterol (PROVENTIL HFA;VENTOLIN HFA) 108 (90 BASE) MCG/ACT inhaler; Inhale 2 puffs into the lungs every 6 (six) hours as needed for wheezing.  Dispense: 1 Inhaler; Refill: 3  5. Morbid obesity Diet and exercise.  6. Screening Ordered baseline blood work.  - CBC with Differential/Platelet; Future - COMPLETE METABOLIC PANEL WITH GFR; Future - Lipid panel; Future - Vit D  25 hydroxy (rtn osteoporosis monitoring); Future - TSH; Future     Health Maintenance  -Pap Smear: uptodate  -Mammogram: uptodate  -Vaccinations:  Patient declines flu shot   Return in about 3 months  (around 03/27/2015) for diabetes, hypertension, CBG check fasting lab in 2 weeks/Nurse Visit.    The patient was given clear instructions to go to ER or return to medical center if symptoms don't improve, worsen or new problems develop. The patient verbalized understanding. The patient was told to call to get lab results if they haven't heard anything in the next week.    This note has been created with Surveyor, quantity. Any transcriptional errors are unintentional.   Lorayne Marek, MD

## 2014-12-25 NOTE — Progress Notes (Signed)
Patient here to establish care Has history of diabetes Patient has been out of her medications for over a week Presents with elevated blood sugar and refused insulin per protocol

## 2014-12-27 ENCOUNTER — Telehealth: Payer: Self-pay

## 2014-12-27 NOTE — Telephone Encounter (Signed)
Returned patient phone call Patient not available Left message on machine to return our call  

## 2014-12-30 ENCOUNTER — Other Ambulatory Visit: Payer: Self-pay

## 2014-12-30 ENCOUNTER — Telehealth: Payer: Self-pay

## 2014-12-30 DIAGNOSIS — J45909 Unspecified asthma, uncomplicated: Secondary | ICD-10-CM

## 2014-12-30 MED ORDER — HYDROCORTISONE 0.5 % EX CREA: 1.0000 "application " | TOPICAL_CREAM | Freq: Two times a day (BID) | CUTANEOUS | Status: DC

## 2014-12-30 MED ORDER — LORATADINE 10 MG PO TABS: 10.0000 mg | ORAL_TABLET | Freq: Every day | ORAL | Status: DC

## 2014-12-30 MED ORDER — ALBUTEROL SULFATE HFA 108 (90 BASE) MCG/ACT IN AERS: 2.0000 | INHALATION_SPRAY | Freq: Four times a day (QID) | RESPIRATORY_TRACT | Status: DC | PRN

## 2014-12-30 NOTE — Telephone Encounter (Signed)
Patient called requesting medication for inhaler Allergy med and cream sent to our pharmacy Per dr Ebony Cargo we can refill prescriptions

## 2015-01-09 ENCOUNTER — Ambulatory Visit: Payer: No Typology Code available for payment source

## 2015-01-16 ENCOUNTER — Ambulatory Visit: Payer: No Typology Code available for payment source

## 2015-01-23 ENCOUNTER — Ambulatory Visit: Payer: No Typology Code available for payment source

## 2015-01-24 ENCOUNTER — Telehealth: Payer: Self-pay

## 2015-01-24 ENCOUNTER — Ambulatory Visit: Payer: No Typology Code available for payment source | Attending: Internal Medicine | Admitting: Family Medicine

## 2015-01-24 DIAGNOSIS — K047 Periapical abscess without sinus: Secondary | ICD-10-CM

## 2015-01-24 MED ORDER — AMOXICILLIN 500 MG PO CAPS: 500.0000 mg | ORAL_CAPSULE | Freq: Three times a day (TID) | ORAL | Status: DC

## 2015-01-24 NOTE — Patient Instructions (Signed)
Take antibiotic 3 times a day, 8 hours apart for 10 days. May use ibuprofen or naproxen OCT for pain. Warm salt water gargles several times a day.

## 2015-01-24 NOTE — Telephone Encounter (Signed)
Voicemail received from patient requesting return call for "medical problem". Return call placed to patient. Patient indicates "one of her back teeth broke off" yesterday. Patient indicates she is "not in a lot of pain," but needing a dental referral.  Informed patient that she should be checked at clinic.  Provided patient with walk-in hours and told to come in today for examination.  Patient verbalized understanding.

## 2015-01-24 NOTE — Progress Notes (Signed)
Patient ID: Joanna Newman, female   DOB: 10/17/1970, 44 y.o.   MRN: [id number]   CC:  Broken tooth.   HPI:   Patient presents with a tooth with decay that has broken off near the gum line sometime in the last week. It hurts to chew on that and there is reported gum swelling and tenderness. She denies fever or chills, swollen lymph nodes.   EXAM:  There is a tooth on the upper left that is broken off with evidence of decay. The gum around the tooth is puffy and erythematous.   ASSESSMENT:  Dental Infection  Plan:  Amoxicillin 500 mg, #30, one po tid (about every 8 hours) for 10 days. We are setting up a dental appointment. You should get a call about that when it is arranged. May use ibuprofen or naproxen prn Warm salt water rinses may help.

## 2015-01-24 NOTE — Progress Notes (Signed)
Patient's tooth broke off on the left upper side in the very back No pain reported except when she eats on it.

## 2015-01-30 ENCOUNTER — Encounter: Payer: Self-pay | Attending: Internal Medicine

## 2015-01-30 VITALS — Ht 64.0 in | Wt 310.3 lb

## 2015-01-30 DIAGNOSIS — Z713 Dietary counseling and surveillance: Secondary | ICD-10-CM | POA: Insufficient documentation

## 2015-01-30 DIAGNOSIS — E119 Type 2 diabetes mellitus without complications: Secondary | ICD-10-CM | POA: Insufficient documentation

## 2015-01-30 NOTE — Progress Notes (Signed)

## 2015-02-06 DIAGNOSIS — E119 Type 2 diabetes mellitus without complications: Secondary | ICD-10-CM

## 2015-02-06 NOTE — Progress Notes (Signed)

## 2015-02-13 ENCOUNTER — Encounter: Payer: Self-pay | Attending: Internal Medicine

## 2015-02-13 DIAGNOSIS — E119 Type 2 diabetes mellitus without complications: Secondary | ICD-10-CM

## 2015-02-13 DIAGNOSIS — Z713 Dietary counseling and surveillance: Secondary | ICD-10-CM | POA: Insufficient documentation

## 2015-02-17 NOTE — Progress Notes (Signed)
Patient was seen on 02/13/15 for the third of a series of three diabetes self-management courses at the [facility].   Joanna Newman the amount of activity recommended for healthy living . Describe activities suitable for individual needs . Identify ways to regularly incorporate activity into daily life . Identify barriers to activity and ways to over come these barriers  Identify diabetes medications being personally used and their primary action for lowering glucose and possible side effects . Describe role of stress on blood glucose and develop strategies to address psychosocial issues . Identify diabetes complications and ways to prevent them  Explain how to manage diabetes during illness . Evaluate success in meeting personal goal . Establish 2-3 goals that they will plan to diligently work on until they return for the  29-month follow-up visit  Goals:   I will count my carb choices at most meals and snacks  I will be active 30 minutes or more 5 times a week  I will take my diabetes medications as scheduled  I will eat less unhealthy fats by eating less fried foods  I will test my glucose at least 6 times a day, 7 days a week  To help manage stress I will  meditate at least 3 times a week  Your patient has identified these potential barriers to change:  Motivation Finances Stress  Your patient has identified their diabetes self-care support plan as  On-line Resources Plan:  Attend Core 4 in 4 months

## 2015-02-24 ENCOUNTER — Ambulatory Visit: Payer: No Typology Code available for payment source | Admitting: Gynecologic Oncology

## 2015-03-03 ENCOUNTER — Telehealth: Payer: Self-pay | Admitting: Nurse Practitioner

## 2015-03-03 NOTE — Telephone Encounter (Signed)
Patient calling to reschedule 03/04/15 apt to 03/21/15 at 10:45. Patient is leaving tonight for vacation. Schedule changed accordingly. She knows to call clinic with any needs in the meantime.

## 2015-03-04 ENCOUNTER — Ambulatory Visit: Payer: No Typology Code available for payment source | Admitting: Gynecologic Oncology

## 2015-03-21 ENCOUNTER — Other Ambulatory Visit: Payer: Self-pay | Admitting: Obstetrics and Gynecology

## 2015-03-21 ENCOUNTER — Encounter: Payer: Self-pay | Admitting: Gynecologic Oncology

## 2015-03-21 ENCOUNTER — Ambulatory Visit: Payer: No Typology Code available for payment source | Attending: Gynecologic Oncology | Admitting: Gynecologic Oncology

## 2015-03-21 VITALS — BP 139/98 | HR 98 | Temp 98.0°F | Resp 18 | Ht 64.0 in | Wt 306.7 lb

## 2015-03-21 DIAGNOSIS — N911 Secondary amenorrhea: Secondary | ICD-10-CM

## 2015-03-21 DIAGNOSIS — N83201 Unspecified ovarian cyst, right side: Secondary | ICD-10-CM

## 2015-03-21 DIAGNOSIS — Z9049 Acquired absence of other specified parts of digestive tract: Secondary | ICD-10-CM | POA: Insufficient documentation

## 2015-03-21 DIAGNOSIS — R102 Pelvic and perineal pain: Secondary | ICD-10-CM

## 2015-03-21 DIAGNOSIS — K429 Umbilical hernia without obstruction or gangrene: Secondary | ICD-10-CM | POA: Insufficient documentation

## 2015-03-21 DIAGNOSIS — I10 Essential (primary) hypertension: Secondary | ICD-10-CM | POA: Insufficient documentation

## 2015-03-21 DIAGNOSIS — N649 Disorder of breast, unspecified: Secondary | ICD-10-CM

## 2015-03-21 DIAGNOSIS — N8320 Unspecified ovarian cysts: Secondary | ICD-10-CM

## 2015-03-21 DIAGNOSIS — Z87891 Personal history of nicotine dependence: Secondary | ICD-10-CM | POA: Insufficient documentation

## 2015-03-21 DIAGNOSIS — Z6841 Body Mass Index (BMI) 40.0 and over, adult: Secondary | ICD-10-CM | POA: Insufficient documentation

## 2015-03-21 DIAGNOSIS — N83202 Unspecified ovarian cyst, left side: Secondary | ICD-10-CM

## 2015-03-21 DIAGNOSIS — D259 Leiomyoma of uterus, unspecified: Secondary | ICD-10-CM | POA: Insufficient documentation

## 2015-03-21 DIAGNOSIS — E1165 Type 2 diabetes mellitus with hyperglycemia: Secondary | ICD-10-CM

## 2015-03-21 DIAGNOSIS — J45909 Unspecified asthma, uncomplicated: Secondary | ICD-10-CM | POA: Insufficient documentation

## 2015-03-21 MED ORDER — IBUPROFEN 600 MG PO TABS: 600.0000 mg | ORAL_TABLET | Freq: Four times a day (QID) | ORAL | Status: DC | PRN

## 2015-03-21 MED ORDER — MEDROXYPROGESTERONE ACETATE 10 MG PO TABS: 10.0000 mg | ORAL_TABLET | Freq: Every day | ORAL | Status: DC

## 2015-03-21 NOTE — Patient Instructions (Signed)
Please followup in 3 months with Dr. Denman George as scheduled or call us sooner with any questions or concerns you have.

## 2015-03-21 NOTE — Progress Notes (Signed)
Followup Note: Gyn-Onc  Patient was referred by Dr. Ihor Dow for the evaluation of Joanna Newman 44 y.o. female with class III morbid obesity, poorly controlled diabetes mellitus and bilateral ovarian cysts  CC:  Chief Complaint  Patient presents with  . Ovarian Cyst    follow-up    Assessment/Plan:  [name]  is a 44 y.o.  year old with bilateral complex ovarian masses which are likely benign, and secondary amenorrhea/oligomenorrhagia secondary to annovulation from morbid obesity. She is not a surgical candidate secondary to her body habitus and extremely poorly controlled DM (HbA1C was 14% in March, 2016).   I have prescribed her ibuprofen for pelvic cramping. I have prescribed her with provera to take cycling q 2 weekly to facilitate withdrawal bleeding and prevent hyperplasia/malignancy.   I spent 20 minutes counseling Joanna Newman regarding optimizing weight loss. She is looking to find a different endocrinologist.  We will recheck her HbA1c in3 months.  We will not entertain surgical evaluation until Joanna Newman has lost 100lbs and has an HBA1c <7%.  HPI: Joanna Newman is a 44 year old gravida 5 para 3 who is seen in consultation at the request of Dr Ihor Dow for bilateral complex ovarian cysts. The patient has cluster morbid obesity with a BMI greater than 55 kg/m. She has a extremely poorly controlled diabetes mellitus with a recent random blood glucose of greater than 500, and an HbA1c drawn in late January 2016 but was 14.3%. She has untreated essential hypertension.  The patient does not have a primary care doctor and seeks consultations in the emergency room or urgent care clinics for exacerbations of symptoms such as abdominal pelvic pain. She reports having bilateral intermittent pelvic pain "for years". This became slightly worse since August 2016. She was seen in the emergency room at [facility] in [place] on 11/06/2014 at which  time she was presenting with abnormal uterine bleeding and underwent an ultrasound that showed a uterus measuring 11.4 x 5.7 x 6.5 cm with fibroids, a 4 mm endometrial thickness, a right ovary measuring 11.7 x 8.6 x 10.2 cm with a cystic mass and a 2.7 cm mural nodule. The left ovary measures 11.4 x 7.2 x 9.7 cm and included a solid-appearing mass with peripheral vascularity. There was no free fluid. This ultrasound scan was repeated on February25th 2016 and impressions were that the ovarian masses were stable with the right measuring approximately 11 cm in greatest dimension the left measuring 8cm in greatest dimension. Uterine fibroids was suspected.   A CT of the abdomen and pelvis was performed [facility] after emergency room visit for pain on February 25th 2016 and this confirmed a large para umbilical hernia with fat extending through a 6.7 cm defect. There was a hypodense homogeneous mass in the left lateral abdomen at the level of the umbilicus umbilicus measuring 6.8 x 5.7 x 7.8 cm. This did not appear to be related to the adnexa. There was 6 separate adnexal masses noted.   An MR of the pelvis was performed on 05/06/2015 and revealed an enlarged fibroid uterus measuring 10.2 x 9.1 x 13.6 cm. Within the left adnexa there was a cyst measuring 6.5 x 6.5 cm. There is no solid nodular component internal septation identified. The impressions were large right adnexal cyst. There is a small left adnexal cyst also noted. It is likely that some of the other masses noted on prior scans were in fact fibroids that were better delineated by this MR.   Her  recheck of HbA1c was 14% in March, 2016. She began seeing a diabetes educator in April 2016 and altered her diet.  Interval History: she has not had a menstrual period for 3 months. Before then menses had been heavy but only every 2-3 months for approximately 6 months since she gained weight. She has mild abdominal crampy pains which are stable and  premenstrual. She has lost 16lbs since seeing a diabetic educator.  Current Meds:  Outpatient Encounter Prescriptions as of 03/21/2015  Medication Sig  . albuterol (PROVENTIL HFA;VENTOLIN HFA) 108 (90 BASE) MCG/ACT inhaler Inhale 2 puffs into the lungs every 6 (six) hours as needed for wheezing.  [name] albuterol (PROVENTIL) (2.5 MG/3ML) 0.083% nebulizer solution Take 2.5 mg by nebulization every 6 (six) hours as needed for wheezing or shortness of breath.   [name] glucose monitoring kit (FREESTYLE) monitoring kit 1 each by Does not apply route 4 (four) times daily - after meals and at bedtime. 1 month Diabetic Testing Supplies for QAC-QHS accuchecks.  . hydrocortisone cream 0.5 % Apply 1 application topically 2 (two) times daily.  [name] loratadine (CLARITIN) 10 MG tablet Take 1 tablet (10 mg total) by mouth daily.  . metFORMIN (GLUCOPHAGE) 1000 MG tablet Take 1 tablet (1,000 mg total) by mouth 2 (two) times daily with a meal.  . metoprolol (LOPRESSOR) 50 MG tablet Take 1 tablet (50 mg total) by mouth 2 (two) times daily.  [name] zolpidem (AMBIEN) 10 MG tablet Take 10 mg by mouth at bedtime as needed for sleep.  . [DISCONTINUED] clonazePAM (KLONOPIN) 0.5 MG tablet Take 0.5 mg by mouth at bedtime as needed (sleep).  . [DISCONTINUED] ibuprofen (ADVIL,MOTRIN) 200 MG tablet Take 200 mg by mouth as needed.  [name] ibuprofen (ADVIL,MOTRIN) 600 MG tablet Take 1 tablet (600 mg total) by mouth every 6 (six) hours as needed.  . medroxyPROGESTERone (PROVERA) 10 MG tablet Take 1 tablet (10 mg total) by mouth daily.  . [DISCONTINUED] amoxicillin (AMOXIL) 500 MG capsule Take 1 capsule (500 mg total) by mouth 3 (three) times daily.   No facility-administered encounter medications on file as of 03/21/2015.    Allergy:  Allergies  Allergen Reactions  . Lisinopril Swelling    Social Hx:   History   Social History  . Marital Status: Single    Spouse Name: N/A  . Number of Children: N/A  . Years of Education: N/A    Occupational History  . Not on file.   Social History Main Topics  . Smoking status: Former Smoker -- 0.25 packs/day for 15 years    Types: Cigarettes    Quit date: 03/11/2012  . Smokeless tobacco: Never Used  . Alcohol Use: Yes     Comment: once a month  . Drug Use: Yes    Special: Marijuana     Comment:  history of substance abuse   . Sexual Activity: Not Currently   Other Topics Concern  . Not on file   Social History Narrative    Past Surgical Hx:  Past Surgical History  Procedure Laterality Date  . Knee surgery    . Tubal ligation    . Cholecystectomy      Past Medical Hx:  Past Medical History  Diagnosis Date  . Substance abuse     cocaine, marijuana and alcohol abuse. quit end of 2012  . Asthma   . Depression   . COPD (chronic obstructive pulmonary disease)   . IBS (irritable bowel syndrome)   . Obesity   .  Hypertension   . Diabetes mellitus without complication     Past Gynecological History:  SV Dx1, cs x2, [id number]  No LMP recorded.  Family Hx:  Family History  Problem Relation Age of Onset  . Hypertension Mother   . Cancer Mother     colon  . Heart disease Father     heart failure  . Diabetes Father   . Diabetes Maternal Grandmother   . Diabetes Paternal Grandmother     Review of Systems:  Constitutional  Feels well,   ENT Normal appearing ears and nares bilaterally Skin/Breast  No rash, sores, jaundice, itching, dryness Cardiovascular  No chest pain, shortness of breath, or edema  Pulmonary  No cough or wheeze.  Gastro Intestinal  No nausea, vomitting, or diarrhoea. No bright red blood per rectum, no abdominal pain, change in bowel movement, or constipation.  Genito Urinary  No frequency, urgency, dysuria, Musculo Skeletal  No myalgia, arthralgia, joint swelling or pain  Neurologic  No weakness, numbness, change in gait,  Psychology  No depression, anxiety, insomnia.   Vitals:  Blood pressure 139/98, pulse 98, temperature  98 F (36.7 C), temperature source Oral, resp. rate 18, height '5\' 4"'  (1.626 m), weight 306 lb 11.2 oz (139.118 kg), SpO2 97 %.  Physical Exam: WD in NAD Neck  deferred Lymph Node Survey deferred Cardiovascular  deferred Lungs  deferred Skin  No rash/lesions/breakdown  Psychiatry  Alert and oriented to person, place, and time  Abdomen  Morbidly obese. Back deferred Genito Urinary  deferred Rectal  deferred Extremities  No bilateral cyanosis, clubbing or edema.   Donaciano Eva, MD   03/21/2015, 11:20 AM

## 2015-04-07 ENCOUNTER — Other Ambulatory Visit: Payer: Self-pay

## 2015-05-27 ENCOUNTER — Other Ambulatory Visit: Payer: Self-pay | Admitting: Obstetrics and Gynecology

## 2015-05-27 DIAGNOSIS — N649 Disorder of breast, unspecified: Secondary | ICD-10-CM

## 2015-06-03 ENCOUNTER — Other Ambulatory Visit: Payer: Self-pay | Admitting: Obstetrics and Gynecology

## 2015-06-03 ENCOUNTER — Other Ambulatory Visit: Payer: Self-pay

## 2015-06-03 DIAGNOSIS — N649 Disorder of breast, unspecified: Secondary | ICD-10-CM

## 2015-06-04 ENCOUNTER — Ambulatory Visit
Admission: RE | Admit: 2015-06-04 | Discharge: 2015-06-04 | Disposition: A | Discharge status: Home / Self Care | Payer: No Typology Code available for payment source | Source: Ambulatory Visit | Attending: Obstetrics and Gynecology | Admitting: Obstetrics and Gynecology

## 2015-06-04 DIAGNOSIS — N649 Disorder of breast, unspecified: Secondary | ICD-10-CM

## 2015-06-19 ENCOUNTER — Telehealth: Payer: Self-pay | Admitting: *Deleted

## 2015-06-19 NOTE — Telephone Encounter (Signed)
Called pt to inquire about A1C labs and if they were drawn at another facility. No results in system, unable to lmovm for pt due to recording "mailbox is full." Will attempt to reach patient again at later time.

## 2015-06-23 ENCOUNTER — Ambulatory Visit: Payer: Self-pay | Admitting: Gynecologic Oncology

## 2015-07-10 ENCOUNTER — Ambulatory Visit: Payer: No Typology Code available for payment source | Admitting: Gynecologic Oncology

## 2015-11-02 ENCOUNTER — Encounter (HOSPITAL_COMMUNITY): Payer: Self-pay | Admitting: *Deleted

## 2015-11-02 ENCOUNTER — Emergency Department (HOSPITAL_COMMUNITY)
Admission: EM | Admit: 2015-11-02 | Discharge: 2015-11-02 | Disposition: A | Discharge status: Home / Self Care | Payer: Self-pay | Attending: Emergency Medicine | Admitting: Emergency Medicine

## 2015-11-02 DIAGNOSIS — E119 Type 2 diabetes mellitus without complications: Secondary | ICD-10-CM | POA: Insufficient documentation

## 2015-11-02 DIAGNOSIS — K611 Rectal abscess: Secondary | ICD-10-CM | POA: Insufficient documentation

## 2015-11-02 DIAGNOSIS — I10 Essential (primary) hypertension: Secondary | ICD-10-CM | POA: Insufficient documentation

## 2015-11-02 DIAGNOSIS — J449 Chronic obstructive pulmonary disease, unspecified: Secondary | ICD-10-CM | POA: Insufficient documentation

## 2015-11-02 DIAGNOSIS — Z87891 Personal history of nicotine dependence: Secondary | ICD-10-CM | POA: Insufficient documentation

## 2015-11-02 DIAGNOSIS — Z7984 Long term (current) use of oral hypoglycemic drugs: Secondary | ICD-10-CM | POA: Insufficient documentation

## 2015-11-02 DIAGNOSIS — E669 Obesity, unspecified: Secondary | ICD-10-CM | POA: Insufficient documentation

## 2015-11-02 DIAGNOSIS — Z79899 Other long term (current) drug therapy: Secondary | ICD-10-CM | POA: Insufficient documentation

## 2015-11-02 DIAGNOSIS — Z8659 Personal history of other mental and behavioral disorders: Secondary | ICD-10-CM | POA: Insufficient documentation

## 2015-11-02 MED ORDER — CIPROFLOXACIN HCL 500 MG PO TABS: 500.0000 mg | ORAL_TABLET | Freq: Two times a day (BID) | ORAL | Status: DC

## 2015-11-02 MED ORDER — METRONIDAZOLE 500 MG PO TABS
500.0000 mg | ORAL_TABLET | Freq: Once | ORAL | Status: AC
Start: 2015-11-02 — End: 2015-11-02
  Administered 2015-11-02: 500 mg via ORAL
  Filled 2015-11-02: qty 1

## 2015-11-02 MED ORDER — CIPROFLOXACIN HCL 250 MG PO TABS
500.0000 mg | ORAL_TABLET | Freq: Once | ORAL | Status: AC
Start: 2015-11-02 — End: 2015-11-02
  Administered 2015-11-02: 500 mg via ORAL
  Filled 2015-11-02: qty 2

## 2015-11-02 MED ORDER — METRONIDAZOLE 500 MG PO TABS: 500.0000 mg | ORAL_TABLET | Freq: Three times a day (TID) | ORAL | Status: DC

## 2015-11-02 MED ORDER — HYDROMORPHONE HCL 1 MG/ML IJ SOLN
1.0000 mg | Freq: Once | INTRAMUSCULAR | Status: AC
  Administered 2015-11-02: 1 mg via INTRAMUSCULAR
  Filled 2015-11-02: qty 1

## 2015-11-02 MED ORDER — LIDOCAINE-EPINEPHRINE (PF) 2 %-1:200000 IJ SOLN
10.0000 mL | Freq: Once | INTRAMUSCULAR | Status: AC
  Administered 2015-11-02: 10 mL
  Filled 2015-11-02: qty 20

## 2015-11-02 NOTE — Discharge Instructions (Signed)
Soak in hot. He will have drainage for several days. Prescription for 2 antibiotics. Tylenol or ibuprofen for pain. Return if worse.

## 2015-11-02 NOTE — ED Notes (Signed)
Pain to rectum x3 days with no drianage.

## 2015-11-02 NOTE — ED Provider Notes (Signed)
CSN: [id number]     Arrival date & time 11/02/15  [id number] History  By signing my name below, I, Jolayne Panther, attest that this documentation has been prepared under the direction and in the presence of Nat Christen, MD. Electronically Signed: Jolayne Panther, Scribe. 11/02/2015. 8:48 AM.   Chief Complaint  Patient presents with  . Rectal Pain   The history is provided by the patient. No language interpreter was used.    HPI Comments: Joanna Newman is a 45 y.o. female who presents to the Emergency Department complaining of peri-rectal pain for three days. No fever, sweats or chills. Pain is worse with sitting and attempting to defecate. Severity of symptoms is moderate. No vomiting or diarrhea   Past Medical History  Diagnosis Date  . Substance abuse     cocaine, marijuana and alcohol abuse. quit end of 2012  . Asthma   . Depression   . COPD (chronic obstructive pulmonary disease) ([place])   . IBS (irritable bowel syndrome)   . Obesity   . Hypertension   . Diabetes mellitus without complication [facility])    Past Surgical History  Procedure Laterality Date  . Knee surgery    . Tubal ligation    . Cholecystectomy     Family History  Problem Relation Age of Onset  . Hypertension Mother   . Cancer Mother     colon  . Heart disease Father     heart failure  . Diabetes Father   . Diabetes Maternal Grandmother   . Diabetes Paternal Grandmother    Social History  Substance Use Topics  . Smoking status: Former Smoker -- 0.25 packs/day for 15 years    Types: Cigarettes    Quit date: 03/11/2012  . Smokeless tobacco: Never Used  . Alcohol Use: Yes     Comment: once a month   OB History    Gravida Para Term Preterm AB TAB SAB Ectopic Multiple Living   5 3 3  2 2    3      Review of Systems  A complete 10 system review of systems was obtained and all systems are negative except as noted in the HPI and PMH.   Allergies  Lisinopril  Home Medications   Prior to  Admission medications   Medication Sig Start Date End Date Taking? Authorizing Provider  albuterol (PROVENTIL HFA;VENTOLIN HFA) 108 (90 BASE) MCG/ACT inhaler Inhale 2 puffs into the lungs every 6 (six) hours as needed for wheezing. 12/30/14  Yes Deepak Advani, MD  albuterol (PROVENTIL) (2.5 MG/3ML) 0.083% nebulizer solution Take 2.5 mg by nebulization every 6 (six) hours as needed for wheezing or shortness of breath.    Yes Historical Provider, MD  hydrOXYzine (VISTARIL) 25 MG capsule Take 25 mg by mouth at bedtime.   Yes Historical Provider, MD  ibuprofen (ADVIL,MOTRIN) 200 MG tablet Take 200 mg by mouth every 6 (six) hours as needed.   Yes Historical Provider, MD  metFORMIN (GLUCOPHAGE) 1000 MG tablet Take 1 tablet (1,000 mg total) by mouth 2 (two) times daily with a meal. 12/25/14  Yes Deepak Advani, MD  metoprolol (LOPRESSOR) 50 MG tablet Take 1 tablet (50 mg total) by mouth 2 (two) times daily. 12/25/14  Yes Lorayne Marek, MD  zolpidem (AMBIEN) 10 MG tablet Take 10 mg by mouth at bedtime as needed for sleep.   Yes Historical Provider, MD  ciprofloxacin (CIPRO) 500 MG tablet Take 1 tablet (500 mg total) by mouth 2 (two) times daily. 11/02/15  Nat Christen, MD  metroNIDAZOLE (FLAGYL) 500 MG tablet Take 1 tablet (500 mg total) by mouth 3 (three) times daily. 11/02/15   Nat Christen, MD   BP 158/102 mmHg  Pulse 110  Temp(Src) 99 F (37.2 C) (Oral)  Resp 18  Ht 5\' 4"  (1.626 m)  Wt 307 lb (139.254 kg)  BMI 52.67 kg/m2  SpO2 99%  LMP 10/14/2015 Physical Exam  Constitutional: She is oriented to person, place, and time.  Obese No acute distress   HENT:  Head: Normocephalic and atraumatic.  Eyes: Conjunctivae and EOM are normal. Pupils are equal, round, and reactive to light.  Neck: Normal range of motion. Neck supple.  Cardiovascular: Normal rate and regular rhythm.   Pulmonary/Chest: Effort normal and breath sounds normal.  Abdominal: Soft. Bowel sounds are normal.  Genitourinary:  3 cm  abscess at approximately 1 o'clock perirecatlly   Musculoskeletal: Normal range of motion.  Neurological: She is alert and oriented to person, place, and time.  Skin: Skin is warm and dry.  Psychiatric: She has a normal mood and affect. Her behavior is normal.  Nursing note and vitals reviewed.   ED Course  Procedures  DIAGNOSTIC STUDIES:    Oxygen Saturation is 99% on RA, normal by my interpretation.  COORDINATION OF CARE:  8:27 AM Incision and drainage of abscess. Discussed treatment plan with pt at bedside and pt agreed to plan.    INCISION AND DRAINAGE PROCEDURE NOTE: Patient identification was confirmed and verbal consent was obtained. This procedure was performed by Nat Christen, MD at 10:12 AM. Site: Perirectal at approximately 1 o'clock Sterile procedures observed  Anesthetic used (type and amt): 1% Xylocaine with epinephrine 1/2000005 mL Blade size: #11 Drainage: Copious amount of pus. Complexity: Complex Packing used no packing. Site anesthetized, incision made over site, wound drained and explored loculations, rinsed with copious amounts of normal saline, wound packed with sterile gauze, covered with dry, sterile dressing.  Pt tolerated procedure well without complications.  Instructions for care discussed verbally and pt provided with additional written instructions for homecare and f/u.  MDM   Final diagnoses:  Perirectal abscess   Incision and drainage of perirectal abscess performed without complications. Copious amount of pus drained. Discharge medications Cipro 500 mg and Flagyl 400 mg.  I personally performed the services described in this documentation, which was scribed in my presence. The recorded information has been reviewed and is accurate.      Nat Christen, MD 11/02/15 [id number]

## 2015-11-02 NOTE — ED Notes (Signed)
Pt reports rectal pain started 3 days ago & pt states pain has become worse.

## 2015-11-27 ENCOUNTER — Ambulatory Visit (HOSPITAL_COMMUNITY)
Admission: RE | Admit: 2015-11-27 | Discharge: 2015-11-27 | Disposition: A | Discharge status: Home / Self Care | Payer: Self-pay | Source: Ambulatory Visit | Attending: Preventative Medicine | Admitting: Preventative Medicine

## 2015-11-27 ENCOUNTER — Other Ambulatory Visit (HOSPITAL_COMMUNITY): Payer: Self-pay | Admitting: Preventative Medicine

## 2015-11-27 DIAGNOSIS — W19XXXA Unspecified fall, initial encounter: Secondary | ICD-10-CM

## 2015-11-27 DIAGNOSIS — M25531 Pain in right wrist: Secondary | ICD-10-CM | POA: Insufficient documentation

## 2015-12-09 ENCOUNTER — Emergency Department (HOSPITAL_COMMUNITY)
Admission: EM | Admit: 2015-12-09 | Discharge: 2015-12-09 | Disposition: A | Discharge status: Home / Self Care | Payer: Self-pay | Attending: Emergency Medicine | Admitting: Emergency Medicine

## 2015-12-09 ENCOUNTER — Encounter (HOSPITAL_COMMUNITY): Payer: Self-pay | Admitting: Emergency Medicine

## 2015-12-09 DIAGNOSIS — Z79899 Other long term (current) drug therapy: Secondary | ICD-10-CM | POA: Insufficient documentation

## 2015-12-09 DIAGNOSIS — Z87891 Personal history of nicotine dependence: Secondary | ICD-10-CM | POA: Insufficient documentation

## 2015-12-09 DIAGNOSIS — Z9851 Tubal ligation status: Secondary | ICD-10-CM | POA: Insufficient documentation

## 2015-12-09 DIAGNOSIS — J45909 Unspecified asthma, uncomplicated: Secondary | ICD-10-CM | POA: Insufficient documentation

## 2015-12-09 DIAGNOSIS — Z7984 Long term (current) use of oral hypoglycemic drugs: Secondary | ICD-10-CM | POA: Insufficient documentation

## 2015-12-09 DIAGNOSIS — R739 Hyperglycemia, unspecified: Secondary | ICD-10-CM

## 2015-12-09 DIAGNOSIS — E1165 Type 2 diabetes mellitus with hyperglycemia: Secondary | ICD-10-CM | POA: Insufficient documentation

## 2015-12-09 DIAGNOSIS — R197 Diarrhea, unspecified: Secondary | ICD-10-CM | POA: Insufficient documentation

## 2015-12-09 DIAGNOSIS — R112 Nausea with vomiting, unspecified: Secondary | ICD-10-CM | POA: Insufficient documentation

## 2015-12-09 DIAGNOSIS — F1099 Alcohol use, unspecified with unspecified alcohol-induced disorder: Secondary | ICD-10-CM | POA: Insufficient documentation

## 2015-12-09 DIAGNOSIS — I10 Essential (primary) hypertension: Secondary | ICD-10-CM | POA: Insufficient documentation

## 2015-12-09 DIAGNOSIS — E860 Dehydration: Secondary | ICD-10-CM | POA: Insufficient documentation

## 2015-12-09 DIAGNOSIS — Z9049 Acquired absence of other specified parts of digestive tract: Secondary | ICD-10-CM | POA: Insufficient documentation

## 2015-12-09 DIAGNOSIS — F329 Major depressive disorder, single episode, unspecified: Secondary | ICD-10-CM | POA: Insufficient documentation

## 2015-12-09 DIAGNOSIS — J449 Chronic obstructive pulmonary disease, unspecified: Secondary | ICD-10-CM | POA: Insufficient documentation

## 2015-12-09 LAB — CBC WITH DIFFERENTIAL/PLATELET
BASOS PCT: 1 %
Basophils Absolute: 0.1 10*3/uL (ref 0.0–0.1)
EOS ABS: 0.1 10*3/uL (ref 0.0–0.7)
EOS PCT: 2 %
HCT: 41.2 % (ref 36.0–46.0)
HEMOGLOBIN: 14 g/dL (ref 12.0–15.0)
LYMPHS ABS: 3.8 10*3/uL (ref 0.7–4.0)
Lymphocytes Relative: 47 %
MCH: 29 pg (ref 26.0–34.0)
MCHC: 34 g/dL (ref 30.0–36.0)
MCV: 85.5 fL (ref 78.0–100.0)
MONO ABS: 0.4 10*3/uL (ref 0.1–1.0)
MONOS PCT: 5 %
Neutro Abs: 3.5 10*3/uL (ref 1.7–7.7)
Neutrophils Relative %: 45 %
PLATELETS: 418 10*3/uL — AB (ref 150–400)
RBC: 4.82 MIL/uL (ref 3.87–5.11)
RDW: 13.4 % (ref 11.5–15.5)
WBC: 7.9 10*3/uL (ref 4.0–10.5)

## 2015-12-09 LAB — COMPREHENSIVE METABOLIC PANEL
ALBUMIN: 4.2 g/dL (ref 3.5–5.0)
ALK PHOS: 120 U/L (ref 38–126)
ALT: 14 U/L (ref 14–54)
AST: 15 U/L (ref 15–41)
Anion gap: 9 (ref 5–15)
BILIRUBIN TOTAL: 0.8 mg/dL (ref 0.3–1.2)
BUN: 11 mg/dL (ref 6–20)
CALCIUM: 8.9 mg/dL (ref 8.9–10.3)
CO2: 24 mmol/L (ref 22–32)
CREATININE: 0.87 mg/dL (ref 0.44–1.00)
Chloride: 102 mmol/L (ref 101–111)
GFR calc non Af Amer: >60 mL/min (ref >60)
GLUCOSE: 391 mg/dL — AB (ref 65–99)
Potassium: 4.1 mmol/L (ref 3.5–5.1)
SODIUM: 135 mmol/L (ref 135–145)
TOTAL PROTEIN: 7.2 g/dL (ref 6.5–8.1)

## 2015-12-09 LAB — CBG MONITORING, ED
GLUCOSE-CAPILLARY: 245 mg/dL — AB (ref 65–99)
Glucose-Capillary: 310 mg/dL — ABNORMAL HIGH (ref 65–99)

## 2015-12-09 MED ORDER — INSULIN ASPART 100 UNIT/ML IV SOLN
5.0000 [IU] | Freq: Once | INTRAVENOUS | Status: AC
  Administered 2015-12-09: 5 [IU] via INTRAVENOUS

## 2015-12-09 MED ORDER — ONDANSETRON HCL 4 MG/2ML IJ SOLN
4.0000 mg | Freq: Once | INTRAMUSCULAR | Status: AC
  Administered 2015-12-09: 4 mg via INTRAVENOUS
  Filled 2015-12-09: qty 2

## 2015-12-09 MED ORDER — SODIUM CHLORIDE 0.9 % IV SOLN
1000.0000 mL | Freq: Once | INTRAVENOUS | Status: AC
  Administered 2015-12-09: 1000 mL via INTRAVENOUS

## 2015-12-09 MED ORDER — ONDANSETRON 4 MG PO TBDP
ORAL_TABLET | ORAL | Status: DC
Start: 2015-12-09 — End: 2016-08-04

## 2015-12-09 MED ORDER — INSULIN ASPART 100 UNIT/ML ~~LOC~~ SOLN
SUBCUTANEOUS | Status: AC
  Administered 2015-12-09: 5 [IU] via INTRAVENOUS
  Filled 2015-12-09: qty 1

## 2015-12-09 NOTE — ED Provider Notes (Signed)
History  By signing my name below, I, Marlowe Kays, attest that this documentation has been prepared under the direction and in the presence of Elnora Morrison, MD. Electronically Signed: Marlowe Kays, ED Scribe. 12/09/2015. 11:04 AM  Chief Complaint  Patient presents with  . Emesis   The history is provided by the patient and medical records. No language interpreter was used.    HPI Comments:  Joanna Newman is a 45 y.o. morbidly obese female who presents to the Emergency Department complaining of nausea, vomiting and diarrhea x 3 episodes that began upon waking this morning. She reports associated chills and generalized body aches. 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 She has not taken anything to treat her symptoms. She denies modifying factors. She denies fever.  Past Medical History  Diagnosis Date  . Substance abuse     cocaine, marijuana and alcohol abuse. quit end of 2012  . Asthma   . Depression   . COPD (chronic obstructive pulmonary disease) ([place])   . IBS (irritable bowel syndrome)   . Obesity   . Hypertension   . Diabetes mellitus without complication [facility])    Past Surgical History  Procedure Laterality Date  . Knee surgery    . Tubal ligation    . Cholecystectomy     Family History  Problem Relation Age of Onset  . Hypertension Mother   . Cancer Mother     colon  . Heart disease Father     heart failure  . Diabetes Father   . Diabetes Maternal Grandmother   . Diabetes Paternal Grandmother    Social History  Substance Use Topics  . Smoking status: Former Smoker -- 0.25 packs/day for 15 years    Types: Cigarettes    Quit date: 03/11/2012  . Smokeless tobacco: Never Used  . Alcohol Use: Yes     Comment: once a month   OB History    Gravida Para Term Preterm AB TAB SAB Ectopic Multiple Living   5 3 3  2 2    3      Review of Systems A complete 10 system review of systems was obtained and  all systems are negative except as noted in the HPI and PMH.   Allergies  Lisinopril  Home Medications   Prior to Admission medications   Medication Sig Start Date End Date Taking? Authorizing Provider  albuterol (PROVENTIL HFA;VENTOLIN HFA) 108 (90 BASE) MCG/ACT inhaler Inhale 2 puffs into the lungs every 6 (six) hours as needed for wheezing. 12/30/14  Yes Deepak Advani, MD  albuterol (PROVENTIL) (2.5 MG/3ML) 0.083% nebulizer solution Take 2.5 mg by nebulization every 6 (six) hours as needed for wheezing or shortness of breath.    Yes Historical Provider, MD  hydrOXYzine (VISTARIL) 25 MG capsule Take 25 mg by mouth at bedtime.   Yes Historical Provider, MD  metFORMIN (GLUCOPHAGE) 1000 MG tablet Take 1 tablet (1,000 mg total) by mouth 2 (two) times daily with a meal. 12/25/14  Yes Deepak Advani, MD  metoprolol (LOPRESSOR) 50 MG tablet Take 1 tablet (50 mg total) by mouth 2 (two) times daily. 12/25/14  Yes Lorayne Marek, MD  zolpidem (AMBIEN) 10 MG tablet Take 10 mg by mouth at bedtime as needed for sleep.   Yes Historical Provider, MD  ciprofloxacin (CIPRO) 500 MG tablet Take 1 tablet (500 mg total) by mouth 2 (two) times daily. Patient not taking: Reported on 12/09/2015 11/02/15   Nat Christen, MD  metroNIDAZOLE (FLAGYL) 500 MG tablet Take 1 tablet (500 mg total) by mouth 3 (three) times daily. Patient not taking: Reported on 12/09/2015 11/02/15   Nat Christen, MD  ondansetron [facility] ODT) 4 MG disintegrating tablet 4mg  ODT q4 hours prn nausea/vomit 12/09/15   Elnora Morrison, MD   Triage Vitals: BP 114/86 mmHg  Pulse 78  Temp(Src) 97.8 F (36.6 C) (Oral)  Resp 18  Ht 5\' 4"  (1.626 m)  Wt 300 lb (136.079 kg)  BMI 51.47 kg/m2  SpO2 97%  LMP 11/27/2015 Physical Exam  Constitutional: She is oriented to person, place, and time. She appears well-developed and well-nourished.  HENT:  Head: Normocephalic and atraumatic.  Mouth/Throat: Mucous membranes are dry.  Mildly dry mucous membranes  Eyes: EOM  are normal.  Neck: Normal range of motion.  Cardiovascular: Normal rate.   Pulmonary/Chest: Effort normal.  Abdominal: Soft. There is tenderness.  Tender in the epigastric region No peritonitis  Musculoskeletal: Normal range of motion.  Neurological: She is alert and oriented to person, place, and time.  Skin: Skin is warm and dry.  Psychiatric: She has a normal mood and affect. Her behavior is normal.  Nursing note and vitals reviewed.   ED Course  Procedures (including critical care time) DIAGNOSTIC STUDIES: Oxygen Saturation is 97% on RA, normal by my interpretation.   COORDINATION OF CARE: 9:23 AM- Will order IV fluids and Zofran. Pt verbalizes understanding and agrees to plan.  Medications  0.9 %  sodium chloride infusion (0 mLs Intravenous Stopped 12/09/15 1120)  ondansetron (ZOFRAN) injection 4 mg (4 mg Intravenous Given 12/09/15 0950)  insulin aspart (novoLOG) injection 5 Units (5 Units Intravenous Given 12/09/15 1122)   Labs Review Labs Reviewed  CBC WITH DIFFERENTIAL/PLATELET - Abnormal; Notable for the following:    Platelets 418 (*)    All other components within normal limits  COMPREHENSIVE METABOLIC PANEL - Abnormal; Notable for the following:    Glucose, Bld 391 (*)    All other components within normal limits  CBG MONITORING, ED - Abnormal; Notable for the following:    Glucose-Capillary 310 (*)    All other components within normal limits  CBG MONITORING, ED    Imaging Review No results found. I have personally reviewed and evaluated these images and lab results as part of my medical decision-making.   EKG Interpretation None      MDM   Final diagnoses:  Nausea vomiting and diarrhea  Dehydration  Hyperglycemia   Patient presents with clinical concern for gastroenteritis with recurrent nausea vomiting diarrhea and sick contacts. Patient improved in the ER tolerating oral, IV fluids given. Small insulin bolus given for hyperglycemia patient could  not tolerate her medicines today. Discussed outpatient follow-up and reasons to return especially if no improvement 48 hours. Work note given.  Results and differential diagnosis were discussed with the patient/parent/guardian. Xrays were independently reviewed by myself.  Close follow up outpatient was discussed, comfortable with the plan.   Medications  0.9 %  sodium chloride infusion (0 mLs Intravenous Stopped 12/09/15 1120)  ondansetron (ZOFRAN) injection 4 mg (4 mg Intravenous Given 12/09/15 0950)  insulin aspart (novoLOG) injection 5 Units (5 Units Intravenous Given 12/09/15 1122)    Filed Vitals:   12/09/15 1030 12/09/15 1100 12/09/15 1123 12/09/15 1159  BP: 156/84 136/74 136/74 152/91  Pulse: 65 75 74 75  Temp:      TempSrc:      Resp:   16 16  Height:      Weight:  SpO2: 92% 99% 100% 100%    Final diagnoses:  Nausea vomiting and diarrhea  Dehydration  Hyperglycemia      Elnora Morrison, MD 12/09/15 1217

## 2015-12-09 NOTE — ED Notes (Addendum)
PT c/o n/v/d with body aches starting this am with diarrhea x3 today and states people at work with same symptoms this week. PT denies any urinary symptoms.

## 2015-12-09 NOTE — Discharge Instructions (Signed)
Zofran as needed for vomiting.  If you were given medicines take as directed.  If you are on coumadin or contraceptives realize their levels and effectiveness is altered by many different medicines.  If you have any reaction (rash, tongues swelling, other) to the medicines stop taking and see a physician.    If your blood pressure was elevated in the ER make sure you follow up for management with a primary doctor or return for chest pain, shortness of breath or stroke symptoms.  Please follow up as directed and return to the ER or see a physician for new or worsening symptoms.  Thank you. Filed Vitals:   12/09/15 0909  BP: 114/86  Pulse: 78  Temp: 97.8 F (36.6 C)  TempSrc: Oral  Resp: 18  Height: 5\' 4"  (1.626 m)  Weight: 300 lb (136.079 kg)  SpO2: 97%

## 2015-12-22 ENCOUNTER — Other Ambulatory Visit: Payer: Self-pay | Admitting: Internal Medicine

## 2015-12-30 ENCOUNTER — Other Ambulatory Visit: Payer: Self-pay | Admitting: Internal Medicine

## 2016-01-01 ENCOUNTER — Other Ambulatory Visit: Payer: Self-pay | Admitting: Internal Medicine

## 2016-01-29 ENCOUNTER — Telehealth: Payer: Self-pay | Admitting: Physician Assistant

## 2016-01-29 NOTE — Telephone Encounter (Signed)
-----   Message from Bronwen Betters sent at 01/29/2016  9:59 AM EDT ----- Regarding: FW: followup, we havent seen her since last year   ----- Message -----    From: Maren Reamer, MD    Sent: 12/31/2015   2:51 PM      To: Esperanza Sheets, RN Subject: followup, we havent seen her since last year   Please call pt and tell her we have not seen her in our clinic since last year.  We'd like to chk on her cholesterol and health maintenance, etc. thx. ----- Message -----    From: Bronwen Betters    Sent: 12/31/2015   2:47 PM      To: Maren Reamer, MD    ----- Message -----    From: SYSTEM    Sent: 12/30/2015  12:04 AM      To: [facility]

## 2016-01-29 NOTE — Telephone Encounter (Signed)
Attempted to call both numbers in chart.  Unable to leave message at home mail box and cell number had a different person's name on the inbox.  Will await patient's return call.

## 2016-07-11 DIAGNOSIS — I639 Cerebral infarction, unspecified: Secondary | ICD-10-CM

## 2016-07-11 HISTORY — DX: Cerebral infarction, unspecified: I63.9

## 2016-08-04 ENCOUNTER — Encounter (HOSPITAL_COMMUNITY): Payer: Self-pay | Admitting: Emergency Medicine

## 2016-08-04 ENCOUNTER — Inpatient Hospital Stay (HOSPITAL_COMMUNITY)
Admission: EM | Admit: 2016-08-04 | Discharge: 2016-08-06 | DRG: 065 | Disposition: A | Discharge status: Home Health | Payer: Self-pay | Attending: Internal Medicine | Admitting: Internal Medicine

## 2016-08-04 ENCOUNTER — Emergency Department (HOSPITAL_COMMUNITY): Payer: Self-pay

## 2016-08-04 DIAGNOSIS — Z833 Family history of diabetes mellitus: Secondary | ICD-10-CM

## 2016-08-04 DIAGNOSIS — R739 Hyperglycemia, unspecified: Secondary | ICD-10-CM

## 2016-08-04 DIAGNOSIS — R202 Paresthesia of skin: Secondary | ICD-10-CM | POA: Diagnosis present

## 2016-08-04 DIAGNOSIS — G459 Transient cerebral ischemic attack, unspecified: Secondary | ICD-10-CM

## 2016-08-04 DIAGNOSIS — I10 Essential (primary) hypertension: Secondary | ICD-10-CM | POA: Diagnosis present

## 2016-08-04 DIAGNOSIS — E785 Hyperlipidemia, unspecified: Secondary | ICD-10-CM | POA: Diagnosis present

## 2016-08-04 DIAGNOSIS — Z9049 Acquired absence of other specified parts of digestive tract: Secondary | ICD-10-CM

## 2016-08-04 DIAGNOSIS — Z87891 Personal history of nicotine dependence: Secondary | ICD-10-CM

## 2016-08-04 DIAGNOSIS — G8194 Hemiplegia, unspecified affecting left nondominant side: Secondary | ICD-10-CM | POA: Diagnosis present

## 2016-08-04 DIAGNOSIS — Z9114 Patient's other noncompliance with medication regimen: Secondary | ICD-10-CM

## 2016-08-04 DIAGNOSIS — Z823 Family history of stroke: Secondary | ICD-10-CM

## 2016-08-04 DIAGNOSIS — Z6841 Body Mass Index (BMI) 40.0 and over, adult: Secondary | ICD-10-CM

## 2016-08-04 DIAGNOSIS — R262 Difficulty in walking, not elsewhere classified: Secondary | ICD-10-CM

## 2016-08-04 DIAGNOSIS — R29810 Facial weakness: Secondary | ICD-10-CM | POA: Diagnosis present

## 2016-08-04 DIAGNOSIS — I639 Cerebral infarction, unspecified: Principal | ICD-10-CM | POA: Diagnosis present

## 2016-08-04 DIAGNOSIS — Z888 Allergy status to other drugs, medicaments and biological substances status: Secondary | ICD-10-CM

## 2016-08-04 DIAGNOSIS — E119 Type 2 diabetes mellitus without complications: Secondary | ICD-10-CM

## 2016-08-04 DIAGNOSIS — E871 Hypo-osmolality and hyponatremia: Secondary | ICD-10-CM | POA: Diagnosis present

## 2016-08-04 DIAGNOSIS — R2681 Unsteadiness on feet: Secondary | ICD-10-CM

## 2016-08-04 DIAGNOSIS — F40240 Claustrophobia: Secondary | ICD-10-CM | POA: Diagnosis present

## 2016-08-04 DIAGNOSIS — E1165 Type 2 diabetes mellitus with hyperglycemia: Secondary | ICD-10-CM | POA: Diagnosis present

## 2016-08-04 DIAGNOSIS — Z8249 Family history of ischemic heart disease and other diseases of the circulatory system: Secondary | ICD-10-CM

## 2016-08-04 DIAGNOSIS — R41841 Cognitive communication deficit: Secondary | ICD-10-CM

## 2016-08-04 DIAGNOSIS — M6281 Muscle weakness (generalized): Secondary | ICD-10-CM

## 2016-08-04 DIAGNOSIS — Z79899 Other long term (current) drug therapy: Secondary | ICD-10-CM

## 2016-08-04 HISTORY — DX: Anxiety disorder, unspecified: F41.9

## 2016-08-04 HISTORY — DX: Insomnia, unspecified: G47.00

## 2016-08-04 LAB — CBC
HCT: 42.9 % (ref 36.0–46.0)
HEMOGLOBIN: 14.5 g/dL (ref 12.0–15.0)
MCH: 29.4 pg (ref 26.0–34.0)
MCHC: 33.8 g/dL (ref 30.0–36.0)
MCV: 86.8 fL (ref 78.0–100.0)
Platelets: 404 10*3/uL — ABNORMAL HIGH (ref 150–400)
RBC: 4.94 MIL/uL (ref 3.87–5.11)
RDW: 13 % (ref 11.5–15.5)
WBC: 9 10*3/uL (ref 4.0–10.5)

## 2016-08-04 LAB — URINALYSIS, ROUTINE W REFLEX MICROSCOPIC
BILIRUBIN URINE: NEGATIVE
Glucose, UA: >1000 mg/dL — AB
KETONES UR: NEGATIVE mg/dL
Leukocytes, UA: NEGATIVE
NITRITE: NEGATIVE
PH: 6 (ref 5.0–8.0)
PROTEIN: NEGATIVE mg/dL
Specific Gravity, Urine: 1.01 (ref 1.005–1.030)

## 2016-08-04 LAB — URINE MICROSCOPIC-ADD ON

## 2016-08-04 LAB — DIFFERENTIAL
BASOS ABS: 0.1 10*3/uL (ref 0.0–0.1)
BASOS PCT: 1 %
EOS ABS: 0.2 10*3/uL (ref 0.0–0.7)
Eosinophils Relative: 2 %
Lymphocytes Relative: 46 %
Lymphs Abs: 4.3 10*3/uL — ABNORMAL HIGH (ref 0.7–4.0)
MONO ABS: 0.5 10*3/uL (ref 0.1–1.0)
MONOS PCT: 6 %
Neutro Abs: 4.1 10*3/uL (ref 1.7–7.7)
Neutrophils Relative %: 45 %

## 2016-08-04 LAB — BASIC METABOLIC PANEL
ANION GAP: 8 (ref 5–15)
BUN: 13 mg/dL (ref 6–20)
CHLORIDE: 97 mmol/L — AB (ref 101–111)
CO2: 25 mmol/L (ref 22–32)
Calcium: 9.2 mg/dL (ref 8.9–10.3)
Creatinine, Ser: 0.99 mg/dL (ref 0.44–1.00)
Glucose, Bld: 419 mg/dL — ABNORMAL HIGH (ref 65–99)
POTASSIUM: 4 mmol/L (ref 3.5–5.1)
SODIUM: 130 mmol/L — AB (ref 135–145)

## 2016-08-04 LAB — PROTIME-INR
INR: 0.92
PROTHROMBIN TIME: 12.4 s (ref 11.4–15.2)

## 2016-08-04 LAB — RAPID URINE DRUG SCREEN, HOSP PERFORMED
AMPHETAMINES: NOT DETECTED
BARBITURATES: NOT DETECTED
Benzodiazepines: NOT DETECTED
Cocaine: NOT DETECTED
OPIATES: NOT DETECTED
TETRAHYDROCANNABINOL: NOT DETECTED

## 2016-08-04 LAB — APTT: aPTT: 25 seconds (ref 24–36)

## 2016-08-04 LAB — TROPONIN I

## 2016-08-04 LAB — CBG MONITORING, ED: GLUCOSE-CAPILLARY: 402 mg/dL — AB (ref 65–99)

## 2016-08-04 LAB — ETHANOL: ALCOHOL ETHYL (B): 9 mg/dL — AB (ref <5)

## 2016-08-04 MED ORDER — SODIUM CHLORIDE 0.9 % IV BOLUS (SEPSIS)
500.0000 mL | Freq: Once | INTRAVENOUS | Status: AC
  Administered 2016-08-04: 500 mL via INTRAVENOUS

## 2016-08-04 NOTE — ED Provider Notes (Signed)
[facility] DEPT Provider Note   CSN: [id number] Arrival date & time: 08/04/16  [id number]     History   Chief Complaint Chief Complaint  Patient presents with  . Numbness    HPI Joanna Newman is a 45 y.o. female.  She is here for evaluation of left-sided numbness, which she noticed upon awakening at 10 AM today. The discomfort is mild. There is no associated weakness, dizziness, headache, nausea, vomiting or prior similar problems. She is out of all of her medicines for several months because of lack of finances and medical insurance. There are no other known modifying factors.  HPI  Past Medical History:  Diagnosis Date  . Anxiety   . Asthma   . Diabetes mellitus without complication ([place])   . Hypertension   . Insomnia   . Obesity   . Substance abuse    cocaine, marijuana and alcohol abuse. quit end of 2012    Patient Active Problem List   Diagnosis Date Noted  . Paresthesia 08/04/2016  . Hyponatremia 08/04/2016  . Essential hypertension 12/25/2014  . Insomnia 12/25/2014  . Asthma, chronic 12/25/2014  . Morbid obesity ([place]) 12/25/2014  . Bilateral ovarian cysts 12/16/2014  . Diabetes ([place]) 12/16/2014  . Morbid obesity with BMI of 50.0-59.9, adult ([place]) 05/20/2012  . Vaginal itching 05/20/2012    Past Surgical History:  Procedure Laterality Date  . CHOLECYSTECTOMY    . KNEE SURGERY    . TUBAL LIGATION      OB History    Gravida Para Term Preterm AB Living   5 3 3   2 3    SAB TAB Ectopic Multiple Live Births     2             Home Medications    Prior to Admission medications   Medication Sig Start Date End Date Taking? Authorizing Provider  aspirin EC 81 MG tablet Take 81 mg by mouth daily.   Yes Historical Provider, MD  hydrOXYzine (VISTARIL) 25 MG capsule Take 25 mg by mouth at bedtime.   Yes Historical Provider, MD  zolpidem (AMBIEN) 10 MG tablet Take 10 mg by mouth at bedtime as needed for sleep.   Yes Historical Provider, MD    Family  History Family History  Problem Relation Age of Onset  . Hypertension Mother   . Cancer Mother 66    colon  . Heart disease Father 18    heart failure  . Diabetes Father   . Heart failure Father   . Diabetes Maternal Grandmother   . CVA Maternal Grandmother 34  . Diabetes Paternal Grandmother   . CVA Maternal Aunt 38    Aneurysm    Social History Social History  Substance Use Topics  . Smoking status: Former Smoker    Packs/day: 0.25    Years: 15.00    Types: Cigarettes    Quit date: 03/11/2012  . Smokeless tobacco: Never Used  . Alcohol use Yes     Comment: once a month     Allergies   Lisinopril   Review of Systems Review of Systems  All other systems reviewed and are negative.    Physical Exam Updated Vital Signs BP (!) 139/122   Pulse 63   Temp 98.5 F (36.9 C) (Oral)   Resp 20   Ht 5\' 4"  (1.626 m)   Wt 278 lb (126.1 kg)   SpO2 99%   BMI 47.72 kg/m   Physical Exam  Constitutional: She is oriented to person,  place, and time. She appears well-developed and well-nourished. No distress.  HENT:  Head: Normocephalic and atraumatic.  Eyes: Conjunctivae and EOM are normal. Pupils are equal, round, and reactive to light.  Neck: Normal range of motion and phonation normal. Neck supple.  Cardiovascular: Normal rate and regular rhythm.   Pulmonary/Chest: Effort normal and breath sounds normal. She exhibits no tenderness.  Abdominal: Soft. She exhibits no distension. There is no tenderness. There is no guarding.  Musculoskeletal: Normal range of motion.  Neurological: She is alert and oriented to person, place, and time. She exhibits normal muscle tone.  No dysarthria and aphasia or nystagmus. Mild decreased light touch sensation left face, left hand. No decreased light touch sensation left forearm, left leg or left foot. No pronator drift. Normal finger-to-nose and heel-to-shin, bilaterally.  Skin: Skin is warm and dry.  Psychiatric: She has a normal mood and  affect. Her behavior is normal. Judgment and thought content normal.  Nursing note and vitals reviewed.    ED Treatments / Results  Labs (all labs ordered are listed, but only abnormal results are displayed) Labs Reviewed  CBC - Abnormal; Notable for the following:       Result Value   Platelets 404 (*)    All other components within normal limits  BASIC METABOLIC PANEL - Abnormal; Notable for the following:    Sodium 130 (*)    Chloride 97 (*)    Glucose, Bld 419 (*)    All other components within normal limits  ETHANOL - Abnormal; Notable for the following:    Alcohol, Ethyl (B) 9 (*)    All other components within normal limits  URINALYSIS, ROUTINE W REFLEX MICROSCOPIC (NOT AT [facility]) - Abnormal; Notable for the following:    Glucose, UA >1000 (*)    Hgb urine dipstick SMALL (*)    All other components within normal limits  DIFFERENTIAL - Abnormal; Notable for the following:    Lymphs Abs 4.3 (*)    All other components within normal limits  URINE MICROSCOPIC-ADD ON - Abnormal; Notable for the following:    Squamous Epithelial / LPF 0-5 (*)    Bacteria, UA RARE (*)    All other components within normal limits  CBG MONITORING, ED - Abnormal; Notable for the following:    Glucose-Capillary 402 (*)    All other components within normal limits  TROPONIN I  PROTIME-INR  APTT  RAPID URINE DRUG SCREEN, HOSP PERFORMED    EKG  EKG Interpretation  Date/Time:  Wednesday August 04 2016 18:15:55 EDT Ventricular Rate:  75 PR Interval:    QRS Duration: 85 QT Interval:  375 QTC Calculation: 419 R Axis:   58 Text Interpretation:  Sinus rhythm Consider right atrial enlargement Low voltage, precordial leads since last tracing no significant change Confirmed by Shaunessy Dobratz  MD, Vira Agar [id number]) on 08/04/2016 8:08:40 PM       Radiology Ct Head Wo Contrast  Result Date: 08/04/2016 CLINICAL DATA:  Left facial numbness EXAM: CT HEAD WITHOUT CONTRAST TECHNIQUE: Contiguous axial images  were obtained from the base of the skull through the vertex without intravenous contrast. COMPARISON:  None. FINDINGS: Brain: No evidence of acute infarction, hemorrhage, hydrocephalus, extra-axial collection or mass lesion/mass effect. Vascular: No hyperdense vessel or unexpected calcification. Skull: Normal. Negative for fracture or focal lesion. Sinuses/Orbits: No acute finding. Other: Cerebral volume is within normal limits. No ventriculomegaly. IMPRESSION: Normal head CT. Electronically Signed   By: Julian Hy M.D.   On: 08/04/2016 18:57  Procedures Procedures (including critical care time)  Medications Ordered in ED Medications  sodium chloride 0.9 % bolus 500 mL (500 mLs Intravenous New Bag/Given 08/04/16 2244)     Initial Impression / Assessment and Plan / ED Course  I have reviewed the triage vital signs and the nursing notes.  Pertinent labs & imaging results that were available during my care of the patient were reviewed by me and considered in my medical decision making (see chart for details).  Clinical Course  Value Comment By Time   Attempted to getet MRI for imaging, but patient would not fit into the machine. Daleen Bo, MD 10/25 1957  CT Head Wo Contrast Wnl  Daleen Bo, MD 10/25 1958  Glucose-Capillary: (!) [address] [place], [place] 10/25 1958  Lymphocytes: 46 (Reviewed) Daleen Bo, MD 10/25 2007  Sodium: (!) 130 low Daleen Bo, MD 10/25 2024  Chloride: (!) 97 Low  Daleen Bo, MD 10/25 2025  Glucose: (!) [address] [place], [place] 10/25 2025  CT Head Wo Contrast NAD Daleen Bo, MD 10/25 2026    Medications  sodium chloride 0.9 % bolus 500 mL (500 mLs Intravenous New Bag/Given 08/04/16 2244)    Patient Vitals for the past 24 hrs:  BP Temp Temp src Pulse Resp SpO2 Height Weight  08/05/16 0000 (!) 139/122 - - 63 20 99 % - -  08/04/16 2330 152/73 - - 73 - 99 % - -  08/04/16 2300 158/93 - - 76 - 100 % - -  08/04/16 2245 (!) 137/118  - - 67 - 99 % - -  08/04/16 2130 143/94 - - 75 - 98 % - -  08/04/16 2100 114/84 - - 73 - 99 % - -  08/04/16 1932 135/78 98.5 F (36.9 C) Oral 74 18 97 % - -  08/04/16 1802 157/100 97.9 F (36.6 C) - 88 17 100 % - -  08/04/16 1801 - - - - - - 5\' 4"  (1.626 m) 278 lb (126.1 kg)  08/04/16 1759 - - - - - - 5\' 4"  (1.626 m) 278 lb (126.1 kg)    9:22 PM Reevaluation with update and discussion. After initial assessment and treatment, an updated evaluation reveals No change in clinical status. She continues to complain of left facial and left hand numbness. She now states that the reason she couldn't get an MRI was that she was claustrophobic when they tried. She states that "it was too tight of a fit". She believes that if she can be sedated. She will be able to undergo the MRI testing. Unfortunately, currently, MRI is not available. Betzaira Mentel L   9:24 PM-Consult complete with hospitalist. Patient case explained and discussed. She agrees to admit patient for further evaluation and treatment. Call ended at 21:30  Final Clinical Impressions(s) / ED Diagnoses   Final diagnoses:  Paresthesia  Noncompliance with medication regimen  Hyperglycemia   Paresthesia and hyperglycemia, with medication noncompliance. Initial imaging negative for CVA. Will admit patient for treatment of blood sugar, monitoring, and try to obtain MR imaging. Under sedation.  Nursing Notes Reviewed/ Care Coordinated, and agree without changes. Applicable Imaging Reviewed.  Interpretation of Laboratory Data incorporated into ED treatment  Plan: Admit  New Prescriptions New Prescriptions   No medications on file     Daleen Bo, MD 08/05/16 0040

## 2016-08-04 NOTE — H&P (Signed)
History and Physical    Joanna Newman [id number] DOB: [birth year] DOA: 08/04/2016  PCP: No PCP Per Patient; has saw a PCP in May or so, would like referral to one Consultants:  None Patient coming from: home - lives with husband (home alone often); NOK: husband  Chief Complaint: numbness  HPI: Joanna Newman is a 45 y.o. female with medical history significant of DM and HTN for which she has not been taking medication for at least a month presenting with concern for CVA.  Patient awoke with left sided facial numbness about 10AM.   Tongue, lips, face numb.  Noticed heaviness in left arm about 1:30.  Difficulty getting onto exercise bike, but otherwise able to do ADLs.  "Feels like someone is pouring cold water down my backside".  Left leg slightly weak but more subtle than arm and face.  No dysphagia.  Notices a bit of trouble pronouncing certain words.  In school, took an exam last night - unremarkable evening. She did have extreme stress yesterday, had money issues and problems with bill collectors.  "was over the top, not sure if that has anything to do with it."  Stopped metformin about a month ago - felt like she had a constant stomach virus. chronic diarrhea.  Taking vinegar and cinnamon in AM, "trying to do a natural thing".  Did not have problems with BP medicine, just ran out about a month ago and has financial problems and no current PCP.    Unable to tolerate MRI tonight, felt cramped and claustrophobic.  Has underlying anxiety.  Needs pre-treatment.    ED Course: Per Dr. Eulis Foster: 9:22 PM Reevaluation with update and discussion. After initial assessment and treatment, an updated evaluation reveals No change in clinical status. She continues to complain of left facial and left hand numbness. She now states that the reason she couldn't get an MRI was that she was claustrophobic when they tried. She states that "it was too tight of a fit". She believes that if she can be sedated.  She will be able to undergo the MRI testing. Unfortunately, currently, MRI is not available. WENTZ,ELLIOTT L   Review of Systems: As per HPI; otherwise 10 point review of systems reviewed and negative.   Ambulatory Status:  ambualtes without assistance.  Past Medical History:  Diagnosis Date  . Anxiety   . Asthma   . Diabetes mellitus without complication ([place])   . Hypertension   . Insomnia   . Obesity   . Substance abuse    cocaine, marijuana and alcohol abuse. quit end of 2012    Past Surgical History:  Procedure Laterality Date  . CHOLECYSTECTOMY    . KNEE SURGERY    . TUBAL LIGATION      Social History   Social History  . Marital status: Married    Spouse name: N/A  . Number of children: N/A  . Years of education: N/A   Occupational History  . event specialist    Social History Main Topics  . Smoking status: Former Smoker    Packs/day: 0.25    Years: 15.00    Types: Cigarettes    Quit date: 03/11/2012  . Smokeless tobacco: Never Used  . Alcohol use Yes     Comment: once a month  . Drug use:     Types: Marijuana     Comment:  history of substance abuse; last marijuana use about 1 week ago  . Sexual activity: Not Currently   Other Topics Concern  .  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 Lisinopril Swelling    Family History  Problem Relation Age of Onset  . Hypertension Mother   . Cancer Mother 52    colon  . Heart disease Father 15    heart failure  . Diabetes Father   . Heart failure Father   . Diabetes Maternal Grandmother   . CVA Maternal Grandmother 100  . Diabetes Paternal Grandmother   . CVA Maternal Aunt 38    Aneurysm    Prior to Admission medications   Medication Sig Start Date End Date Taking? Authorizing Provider  albuterol (PROVENTIL HFA;VENTOLIN HFA) 108 (90 BASE) MCG/ACT inhaler Inhale 2 puffs into the lungs every 6 (six)  hours as needed for wheezing. 12/30/14   Lorayne Marek, MD  albuterol (PROVENTIL) (2.5 MG/3ML) 0.083% nebulizer solution Take 2.5 mg by nebulization every 6 (six) hours as needed for wheezing or shortness of breath.     Historical Provider, MD  hydrOXYzine (VISTARIL) 25 MG capsule Take 25 mg by mouth at bedtime.    Historical Provider, MD  metoprolol (LOPRESSOR) 50 MG tablet Take 1 tablet (50 mg total) by mouth 2 (two) times daily. 12/25/14   Lorayne Marek, MD  ondansetron (ZOFRAN ODT) 4 MG disintegrating tablet 4mg  ODT q4 hours prn nausea/vomit 12/09/15   Elnora Morrison, MD  zolpidem (AMBIEN) 10 MG tablet Take 10 mg by mouth at bedtime as needed for sleep.    Historical Provider, MD    Physical Exam: Vitals:   08/04/16 1932 08/04/16 2100 08/04/16 2130 08/04/16 2245  BP: 135/78 114/84 143/94 (!) 137/118  Pulse: 74 73 75 67  Resp: 18     Temp: 98.5 F (36.9 C)     TempSrc: Oral     SpO2: 97% 99% 98% 99%  Weight:      Height:         General: Appears calm and comfortable and is NAD Eyes:  PERRL, EOMI, normal lids, iris ENT:  grossly normal hearing, lips & tongue, mmm Neck:  no LAD, masses or thyromegaly Cardiovascular:  RRR, no m/r/g. No LE edema.  Respiratory:  CTA bilaterally, no w/r/r. Normal respiratory effort. Abdomen:  soft, ntnd, NABS Skin:  no rash or induration seen on limited exam Musculoskeletal:  grossly normal tone BUE/BLE, good ROM while not being examined but demonstrates more significant weakness of the left arm and leg during exam, no bony abnormality.   Inconsistent effort on exam, diminished strength on left both arm and leg that appears to be out of proportion to expected. Psychiatric:  grossly normal mood and affect, speech fluent and appropriate, AOx3 Neurologic:  CN 2-12 grossly intact (inconsistent effort which alters result, but likely is neurologically intact based on observations during conversation; for example, fully able to speak clearly without any slurring  and can stick out without deviation, but when asked to stick it out and wiggle it, it is no longer able to project out of her mouth and only able to move to the right), moves all extremities in coordinated fashion, sensation diminished on entire left face, arm, leg.  Labs on Admission: I have personally reviewed following labs and imaging studies  CBC:  Recent Labs Lab 08/04/16 1900  WBC 9.0  NEUTROABS 4.1  HGB 14.5  HCT 42.9  MCV 86.8  PLT [id number]*   Basic Metabolic Panel:  Recent Labs Lab 08/04/16  1900  NA 130*  K 4.0  CL 97*  CO2 25  GLUCOSE 419*  BUN 13  CREATININE 0.99  CALCIUM 9.2   GFR: Estimated Creatinine Clearance: 93.4 mL/min (by C-G formula based on SCr of 0.99 mg/dL). Liver Function Tests: No results for input(s): AST, ALT, ALKPHOS, BILITOT, PROT, ALBUMIN in the last 168 hours. No results for input(s): LIPASE, AMYLASE in the last 168 hours. No results for input(s): AMMONIA in the last 168 hours. Coagulation Profile:  Recent Labs Lab 08/04/16 1900  INR 0.92   Cardiac Enzymes:  Recent Labs Lab 08/04/16 1900  TROPONINI <0.03   BNP (last 3 results) No results for input(s): PROBNP in the last 8760 hours. HbA1C: No results for input(s): HGBA1C in the last 72 hours. CBG:  Recent Labs Lab 08/04/16 1822  GLUCAP 402*   Lipid Profile: No results for input(s): CHOL, HDL, LDLCALC, TRIG, CHOLHDL, LDLDIRECT in the last 72 hours. Thyroid Function Tests: No results for input(s): TSH, T4TOTAL, FREET4, T3FREE, THYROIDAB in the last 72 hours. Anemia Panel: No results for input(s): VITAMINB12, FOLATE, FERRITIN, TIBC, IRON, RETICCTPCT in the last 72 hours. Urine analysis:    Component Value Date/Time   COLORURINE YELLOW 08/04/2016 [place] 08/04/2016 1824   LABSPEC 1.010 08/04/2016 1824   PHURINE 6.0 08/04/2016 1824   GLUCOSEU >1000 (A) 08/04/2016 1824   HGBUR SMALL (A) 08/04/2016 1824   BILIRUBINUR NEGATIVE 08/04/2016 1824    BILIRUBINUR neg 12/25/2014 [place] 08/04/2016 1824   PROTEINUR NEGATIVE 08/04/2016 1824   UROBILINOGEN 0.2 12/25/2014 1123   UROBILINOGEN 0.2 12/05/2014 0634   NITRITE NEGATIVE 08/04/2016 1824   LEUKOCYTESUR NEGATIVE 08/04/2016 1824    Creatinine Clearance: Estimated Creatinine Clearance: 93.4 mL/min (by C-G formula based on SCr of 0.99 mg/dL).  Sepsis Labs: @LABRCNTIP (procalcitonin:4,lacticidven:4) )No results found for this or any previous visit (from the past 240 hour(s)).   Radiological Exams on Admission: Ct Head Wo Contrast  Result Date: 08/04/2016 CLINICAL DATA:  Left facial numbness EXAM: CT HEAD WITHOUT CONTRAST TECHNIQUE: Contiguous axial images were obtained from the base of the skull through the vertex without intravenous contrast. COMPARISON:  None. FINDINGS: Brain: No evidence of acute infarction, hemorrhage, hydrocephalus, extra-axial collection or mass lesion/mass effect. Vascular: No hyperdense vessel or unexpected calcification. Skull: Normal. Negative for fracture or focal lesion. Sinuses/Orbits: No acute finding. Other: Cerebral volume is within normal limits. No ventriculomegaly. IMPRESSION: Normal head CT. Electronically Signed   By: Julian Hy M.D.   On: 08/04/2016 18:57    EKG: Independently reviewed.  NSR with rate 75; no evidence of acute ischemia  Assessment/Plan Principal Problem:   Paresthesia Active Problems:   Diabetes ([facility])   Essential hypertension   Morbid obesity ([facility])   Hyponatremia   Paresthesia -Left facial and arm/leg weakness, numbness with inconsistent exam -Not overly concerning for TIA/CVA but does warrant further evaluation -Unable to complete MRI due to claustrophobia (reported difficulty due to her weight, but she is well <300 pounds and so should not have difficulty fitting into the MRI machine) -Will place in observation status for CVA/TIA evaluation -Telemetry monitoring -MRI/MRA -Carotid  dopplers -Echo -Risk stratification with FLP, A1c -ASA daily -PT/OT/ST/Nutrition consults -CM consult to assist with PCP  HTN -Allow permissive HTN -Has been off BP medications for >1 month; plan to restart in [id number] hours (uncertain which medication(s) she took, would at least consider ACE-I)  HLD -Start statin assuming LDL is not at goal  Obesity -Nutrition consult -  Needs weight loss  DM -Poor control -Reports inability to tolerate Metformin -Will check A1c; if >9, patient likely needs insulin anyway -For now, will give Lantus 10 units and cover with SSI  Hyponatremia -Corrected for glucose, Na++ is 135 -Treat hyperglycemia and follow  DVT prophylaxis: Lovenox  Code Status:  Full - confirmed with patient Family Communication: None present Disposition Plan:  Home once clinically improved Consults called: None  Admission status: It is my clinical opinion that referral for OBSERVATION is reasonable and necessary in this patient based on the above information provided. The aforementioned taken together are felt to place the patient at high risk for further clinical deterioration. However it is anticipated that the patient may be medically stable for discharge from the hospital within 24 to 48 hours.     Karmen Bongo MD [facility]  If 7PM-7AM, please contact night-coverage [email or web address] Password [email or web address]  08/04/2016, 11:22 PM

## 2016-08-04 NOTE — ED Notes (Signed)
Pt taken to ct 

## 2016-08-04 NOTE — ED Notes (Signed)
Patient's bgl was 402.  Patient stated she had just ate dinner before she came to the E.R.

## 2016-08-04 NOTE — ED Triage Notes (Signed)
Patient states she woke at 1000 this morning with numbness to left side of face radiating down left shoulder and left arm. Patient ambulatory at triage with no assistance or difficulty.

## 2016-08-05 ENCOUNTER — Observation Stay (HOSPITAL_COMMUNITY): Payer: Self-pay

## 2016-08-05 ENCOUNTER — Encounter (HOSPITAL_COMMUNITY): Payer: Self-pay

## 2016-08-05 DIAGNOSIS — R202 Paresthesia of skin: Secondary | ICD-10-CM

## 2016-08-05 DIAGNOSIS — E871 Hypo-osmolality and hyponatremia: Secondary | ICD-10-CM

## 2016-08-05 DIAGNOSIS — E119 Type 2 diabetes mellitus without complications: Secondary | ICD-10-CM

## 2016-08-05 DIAGNOSIS — I10 Essential (primary) hypertension: Secondary | ICD-10-CM

## 2016-08-05 LAB — RAPID URINE DRUG SCREEN, HOSP PERFORMED
AMPHETAMINES: NOT DETECTED
BENZODIAZEPINES: NOT DETECTED
Barbiturates: NOT DETECTED
COCAINE: NOT DETECTED
OPIATES: NOT DETECTED
Tetrahydrocannabinol: POSITIVE — AB

## 2016-08-05 LAB — LIPID PANEL
Cholesterol: 197 mg/dL (ref 0–200)
HDL: 36 mg/dL — ABNORMAL LOW (ref >40)
LDL CALC: 118 mg/dL — AB (ref 0–99)
Total CHOL/HDL Ratio: 5.5 RATIO
Triglycerides: 217 mg/dL — ABNORMAL HIGH (ref <150)
VLDL: 43 mg/dL — AB (ref 0–40)

## 2016-08-05 LAB — GLUCOSE, CAPILLARY
GLUCOSE-CAPILLARY: 303 mg/dL — AB (ref 65–99)
Glucose-Capillary: 305 mg/dL — ABNORMAL HIGH (ref 65–99)
Glucose-Capillary: 221 mg/dL — ABNORMAL HIGH (ref 65–99)
Glucose-Capillary: 162 mg/dL — ABNORMAL HIGH (ref 65–99)

## 2016-08-05 MED ORDER — ATORVASTATIN CALCIUM 20 MG PO TABS
20.0000 mg | ORAL_TABLET | Freq: Every day | ORAL | Status: DC
  Administered 2016-08-05: 20 mg via ORAL
  Filled 2016-08-05: qty 1

## 2016-08-05 MED ORDER — ASPIRIN 325 MG PO TABS
325.0000 mg | ORAL_TABLET | Freq: Every day | ORAL | Status: DC
  Administered 2016-08-05 – 2016-08-06 (×2): 325 mg via ORAL
  Filled 2016-08-05 (×2): qty 1

## 2016-08-05 MED ORDER — ASPIRIN 300 MG RE SUPP
300.0000 mg | Freq: Every day | RECTAL | Status: DC
  Administered 2016-08-05: 300 mg via RECTAL
  Filled 2016-08-05: qty 1

## 2016-08-05 MED ORDER — LORAZEPAM 2 MG/ML IJ SOLN
2.0000 mg | Freq: Once | INTRAMUSCULAR | Status: AC
  Administered 2016-08-05: 2 mg via INTRAVENOUS
  Filled 2016-08-05: qty 1

## 2016-08-05 MED ORDER — HYDROXYZINE PAMOATE 25 MG PO CAPS
25.0000 mg | ORAL_CAPSULE | Freq: Every day | ORAL | Status: DC
  Filled 2016-08-05 (×2): qty 1

## 2016-08-05 MED ORDER — INSULIN ASPART 100 UNIT/ML ~~LOC~~ SOLN
0.0000 [IU] | Freq: Three times a day (TID) | SUBCUTANEOUS | Status: DC
  Administered 2016-08-05: 5 [IU] via SUBCUTANEOUS
  Administered 2016-08-05: 11 [IU] via SUBCUTANEOUS
  Administered 2016-08-06: 3 [IU] via SUBCUTANEOUS
  Administered 2016-08-06: 5 [IU] via SUBCUTANEOUS

## 2016-08-05 MED ORDER — LIVING WELL WITH DIABETES BOOK
Freq: Once | Status: AC
  Administered 2016-08-05: 10:00:00
  Filled 2016-08-05: qty 1

## 2016-08-05 MED ORDER — METFORMIN HCL 500 MG PO TABS
500.0000 mg | ORAL_TABLET | Freq: Two times a day (BID) | ORAL | Status: DC
  Administered 2016-08-05 – 2016-08-06 (×2): 500 mg via ORAL
  Filled 2016-08-05 (×2): qty 1

## 2016-08-05 MED ORDER — ACETAMINOPHEN 325 MG PO TABS
650.0000 mg | ORAL_TABLET | ORAL | Status: DC | PRN
  Administered 2016-08-05 – 2016-08-06 (×2): 650 mg via ORAL
  Filled 2016-08-05 (×2): qty 2

## 2016-08-05 MED ORDER — INSULIN GLARGINE 100 UNIT/ML ~~LOC~~ SOLN
10.0000 [IU] | Freq: Every day | SUBCUTANEOUS | Status: DC
  Administered 2016-08-05: 10 [IU] via SUBCUTANEOUS
  Filled 2016-08-05 (×2): qty 0.1

## 2016-08-05 MED ORDER — ACETAMINOPHEN 650 MG RE SUPP
650.0000 mg | RECTAL | Status: DC | PRN
Start: 2016-08-05 — End: 2016-08-06

## 2016-08-05 MED ORDER — SENNOSIDES-DOCUSATE SODIUM 8.6-50 MG PO TABS: 1.0000 | ORAL_TABLET | Freq: Every evening | ORAL | Status: DC | PRN

## 2016-08-05 MED ORDER — INSULIN ASPART PROT & ASPART (70-30 MIX) 100 UNIT/ML ~~LOC~~ SUSP
30.0000 [IU] | Freq: Two times a day (BID) | SUBCUTANEOUS | Status: DC
  Administered 2016-08-05 – 2016-08-06 (×2): 30 [IU] via SUBCUTANEOUS
  Filled 2016-08-05: qty 10

## 2016-08-05 MED ORDER — STROKE: EARLY STAGES OF RECOVERY BOOK
Freq: Once | Status: DC
  Filled 2016-08-05: qty 1

## 2016-08-05 MED ORDER — INSULIN STARTER KIT- SYRINGES (ENGLISH)
1.0000 | Freq: Once | Status: AC
  Administered 2016-08-05: 1
  Filled 2016-08-05: qty 1

## 2016-08-05 MED ORDER — HYDROXYZINE HCL 25 MG PO TABS
25.0000 mg | ORAL_TABLET | Freq: Every day | ORAL | Status: DC
  Administered 2016-08-05: 25 mg via ORAL
  Filled 2016-08-05: qty 1

## 2016-08-05 MED ORDER — ENOXAPARIN SODIUM 80 MG/0.8ML ~~LOC~~ SOLN
0.5000 mg/kg | SUBCUTANEOUS | Status: DC
  Administered 2016-08-05: 65 mg via SUBCUTANEOUS
  Filled 2016-08-05: qty 0.8

## 2016-08-05 MED ORDER — SODIUM CHLORIDE 0.9 % IV SOLN
INTRAVENOUS | Status: DC
  Administered 2016-08-05: 02:00:00 via INTRAVENOUS

## 2016-08-05 NOTE — Evaluation (Signed)
Physical Therapy Evaluation Patient Details Name: Joanna Newman MRN: [id number] DOB: 16-Feb-1971 Today's Date: 08/05/2016   History of Present Illness  45 y.o. female with medical history significant of DM and HTN for which she has not been taking medication for at least a month presenting with concern for CVA.  Patient awoke with left sided facial numbness about 10AM.   Tongue, lips, face numb.  Noticed heaviness in left arm about 1:30.  Difficulty getting onto exercise bike, but otherwise able to do ADLs.  "Feels like someone is pouring cold water down my backside".  Left leg slightly weak but more subtle than arm and face.  No dysphagia.  Notices a bit of trouble pronouncing certain words.  Clinical Impression  Patient presenting sitting at EOB with OT Present, pleasant and willing to participate in skilled PT services; she does require min guard for safety with functional transfers and especially during gait with no device due to general unsteadiness and reduced functional activity tolerance. While she is able to ambulate with no device and min guard for safety, DPT strongly recommends that she use device for safety upon her return home. Patient very fatigued in general at end of session. Patient will benefit from skilled PT services during her stay at this facility, and DPT recommends skilled HHPT services moving forward. Patient left side-lying in bed with all questions/concerns addressed, all needs met, and bed alarm activated.     Follow Up Recommendations Home health PT    Equipment Recommendations  Standard walker    Recommendations for Other Services       Precautions / Restrictions Precautions Precautions: Fall Restrictions Weight Bearing Restrictions: No      Mobility  Bed Mobility Overal bed mobility: Modified Independent                Transfers Overall transfer level: Needs assistance Equipment used: None Transfers: Sit to/from Stand Sit to Stand: Min  guard            Ambulation/Gait Ambulation/Gait assistance: Min guard Ambulation Distance (Feet): 40 Feet Assistive device: None Gait Pattern/deviations: Step-through pattern;Decreased step length - right;Decreased stance time - left;Decreased dorsiflexion - left;Decreased weight shift to left;Trendelenburg;Drifts right/left;Trunk flexed   Gait velocity interpretation: <1.8 ft/sec, indicative of risk for recurrent falls General Gait Details: unsteady with no assisitive device, requires min guard for safety and easily fatigued with gait   Stairs            Wheelchair Mobility    Modified Rankin (Stroke Patients Only)       Balance Overall balance assessment: Needs assistance Sitting-balance support: No upper extremity supported Sitting balance-Leahy Scale: Good     Standing balance support: No upper extremity supported Standing balance-Leahy Scale: Fair                               Pertinent Vitals/Pain Pain Assessment: No/denies pain    Home Living Family/patient expects to be discharged to:: Private residence Living Arrangements: Spouse/significant other Available Help at Discharge: Family;Available PRN/intermittently Type of Home: House Home Access: Stairs to enter Entrance Stairs-Rails: Can reach both Entrance Stairs-Number of Steps: 4 Home Layout: One level Home Equipment: Walker - 2 wheels;Cane - single point      Prior Function Level of Independence: Independent               Hand Dominance   Dominant Hand: Right    Extremity/Trunk Assessment   Upper  Extremity Assessment: Defer to OT evaluation       LUE Deficits / Details: Pt with weakness in LUE, strength 3/5 throughout, grip strength decreased.    Lower Extremity Assessment: LLE deficits/detail   LLE Deficits / Details: significant weakness noted throughout L LE with L ankle DF strength approximately 3/5, L knee extension 3-/5, trunk strength approximately 4/5, hip  abductors L approximately 4-/5 as measured in sitting   Cervical / Trunk Assessment: Normal  Communication   Communication: No difficulties  Cognition Arousal/Alertness: Awake/alert Behavior During Therapy: WFL for tasks assessed/performed Overall Cognitive Status: Within Functional Limits for tasks assessed                      General Comments      Exercises     Assessment/Plan    PT Assessment Patient needs continued PT services  PT Problem List Decreased strength;Decreased mobility;Decreased safety awareness;Decreased coordination;Decreased activity tolerance;Decreased balance;Decreased knowledge of use of DME          PT Treatment Interventions DME instruction;Therapeutic activities;Gait training;Therapeutic exercise;Patient/family education;Stair training;Balance training;Functional mobility training;Neuromuscular re-education    PT Goals (Current goals can be found in the Care Plan section)  Acute Rehab PT Goals Patient Stated Goal: to get her feeling back/get back home  PT Goal Formulation: With patient Time For Goal Achievement: 08/19/16 Potential to Achieve Goals: Good    Frequency 7X/week   Barriers to discharge Other (comment) she is at home by herself 5 days out of the week as her husband is a truck driver     Co-evaluation PT/OT/SLP Co-Evaluation/Treatment: Yes Reason for Co-Treatment: Complexity of the patient's impairments (multi-system involvement) PT goals addressed during session: Mobility/safety with mobility;Balance;Strengthening/ROM OT goals addressed during session: ADL's and self-care       End of Session Equipment Utilized During Treatment: Gait belt Activity Tolerance: Patient tolerated treatment well;Patient limited by fatigue Patient left: in bed;with call bell/phone within reach;with bed alarm set      Functional Assessment Tool Used: Based on skilled clinical assessment of functional mobility, strength, functional transfers,  gait, balance  Functional Limitation: Mobility: Walking and moving around Mobility: Walking and Moving Around Current Status (T6256): At least 40 percent but less than 60 percent impaired, limited or restricted Mobility: Walking and Moving Around Goal Status [phone]): At least 20 percent but less than 40 percent impaired, limited or restricted    Time: 0840-0853 PT Time Calculation (min) (ACUTE ONLY): 13 min   Charges:   PT Evaluation $PT Eval Low Complexity: 1 Procedure     PT G Codes:   PT G-Codes **NOT FOR INPATIENT CLASS** Functional Assessment Tool Used: Based on skilled clinical assessment of functional mobility, strength, functional transfers, gait, balance  Functional Limitation: Mobility: Walking and moving around Mobility: Walking and Moving Around Current Status (D4287): At least 40 percent but less than 60 percent impaired, limited or restricted Mobility: Walking and Moving Around Goal Status [phone]): At least 20 percent but less than 40 percent impaired, limited or restricted    Deniece Ree PT, DPT [phone]

## 2016-08-05 NOTE — Progress Notes (Signed)
PROGRESS NOTE  Joanna Newman  [id number] DOB: 1971/04/20 DOA: 08/04/2016 PCP: No PCP Per Patient Outpatient Specialists:  Subjective: Continues to complain about numbness in her face and heaviness in her arm, MRI pending.  Brief Narrative:  Joanna Newman is a 45 y.o. female with medical history significant of DM and HTN for which she has not been taking medication for at least a month presenting with concern for CVA.  Patient awoke with left sided facial numbness about 10AM.   Tongue, lips, face numb.  Noticed heaviness in left arm about 1:30.  Difficulty getting onto exercise bike, but otherwise able to do ADLs.  "Feels like someone is pouring cold water down my backside".  Left leg slightly weak but more subtle than arm and face.  No dysphagia.    Assessment & Plan:   Principal Problem:   Paresthesia Active Problems:   Diabetes ([place])   Essential hypertension   Morbid obesity (H[facility])   Hyponatremia   Paresthesia -Left facial and arm/leg weakness, numbness with inconsistent exam -Not overly concerning for TIA/CVA but does warrant further evaluation -Unable to complete MRI due to claustrophobia (reported difficulty due to her weight, but she is well <300 pounds and so should not have difficulty fitting into the MRI machine) -Telemetry monitoring -MRI/MRA -Carotid dopplers -Echo -Risk stratification with FLP, A1c -ASA daily -PT/OT/ST/Nutrition consults -CM consult to assist with PCP  Diabetes mellitus type 2, uncontrolled -Poor control -Reports inability to tolerate Metformin -Will check A1c; if >9, patient likely needs insulin anyway -Does not have insurance, will start on 70/30 is likely this is what she will afford at home.  HTN -Allow permissive HTN -Has been off BP medications for >1 month; plan to restart in [id number] hours (uncertain which medication(s) she took, would at least consider ACE-I)  HLD -Start statin assuming LDL is not at  goal  Obesity -Nutrition consult -Needs weight loss  Hyponatremia -Pseudohyponatremia, Corrected for glucose, Na++ is 135   DVT prophylaxis:  Code Status: Full Code Family Communication:  Disposition Plan:  Diet: Diet Carb Modified Fluid consistency: Thin; Room service appropriate? Yes  Consultants:   None  Procedures:   None  Antimicrobials:   None   Objective: Vitals:   08/05/16 0318 08/05/16 0518 08/05/16 0718 08/05/16 0918  BP: (!) 155/83 139/81 127/88 (!) 141/84  Pulse: 71 77 78 77  Resp: 16 14 18 20   Temp:  98.6 F (37 C) 98.4 F (36.9 C) 97.7 F (36.5 C)  TempSrc:  Oral Oral Oral  SpO2: 99% 99% 98% 100%  Weight:      Height:        Intake/Output Summary (Last 24 hours) at 08/05/16 1205 Last data filed at 08/05/16 0046  Gross per 24 hour  Intake              500 ml  Output                0 ml  Net              500 ml   Filed Weights   08/04/16 1759 08/04/16 1801 08/05/16 0118  Weight: 126.1 kg (278 lb) 126.1 kg (278 lb) 129.1 kg (284 lb 11.2 oz)    Examination: General exam: Appears calm and comfortable  Respiratory system: Clear to auscultation. Respiratory effort normal. Cardiovascular system: S1 & S2 heard, RRR. No JVD, murmurs, rubs, gallops or clicks. No pedal edema. Gastrointestinal system: Abdomen is nondistended, soft and nontender. No organomegaly or masses  felt. Normal bowel sounds heard. Central nervous system: Alert and oriented. No focal neurological deficits. Extremities: Symmetric 5 x 5 power. Skin: No rashes, lesions or ulcers Psychiatry: Judgement and insight appear normal. Mood & affect appropriate.   Data Reviewed: I have personally reviewed following labs and imaging studies  CBC:  Recent Labs Lab 08/04/16 1900  WBC 9.0  NEUTROABS 4.1  HGB 14.5  HCT 42.9  MCV 86.8  PLT [id number]*   Basic Metabolic Panel:  Recent Labs Lab 08/04/16 1900  NA 130*  K 4.0  CL 97*  CO2 25  GLUCOSE 419*  BUN 13  CREATININE 0.99   CALCIUM 9.2   GFR: Estimated Creatinine Clearance: 94.7 mL/min (by C-G formula based on SCr of 0.99 mg/dL). Liver Function Tests: No results for input(s): AST, ALT, ALKPHOS, BILITOT, PROT, ALBUMIN in the last 168 hours. No results for input(s): LIPASE, AMYLASE in the last 168 hours. No results for input(s): AMMONIA in the last 168 hours. Coagulation Profile:  Recent Labs Lab 08/04/16 1900  INR 0.92   Cardiac Enzymes:  Recent Labs Lab 08/04/16 1900  TROPONINI <0.03   BNP (last 3 results) No results for input(s): PROBNP in the last 8760 hours. HbA1C: No results for input(s): HGBA1C in the last 72 hours. CBG:  Recent Labs Lab 08/04/16 1822 08/05/16 0727 08/05/16 1113  GLUCAP 402* 303* 305*   Lipid Profile:  Recent Labs  08/05/16 0637  CHOL 197  HDL 36*  LDLCALC 118*  TRIG 217*  CHOLHDL 5.5   Thyroid Function Tests: No results for input(s): TSH, T4TOTAL, FREET4, T3FREE, THYROIDAB in the last 72 hours. Anemia Panel: No results for input(s): VITAMINB12, FOLATE, FERRITIN, TIBC, IRON, RETICCTPCT in the last 72 hours. Urine analysis:    Component Value Date/Time   COLORURINE YELLOW 08/04/2016 [place] 08/04/2016 1824   LABSPEC 1.010 08/04/2016 1824   PHURINE 6.0 08/04/2016 1824   GLUCOSEU >1000 (A) 08/04/2016 1824   HGBUR SMALL (A) 08/04/2016 1824   BILIRUBINUR NEGATIVE 08/04/2016 1824   BILIRUBINUR neg 12/25/2014 [place] 08/04/2016 1824   PROTEINUR NEGATIVE 08/04/2016 1824   UROBILINOGEN 0.2 12/25/2014 1123   UROBILINOGEN 0.2 12/05/2014 0634   NITRITE NEGATIVE 08/04/2016 1824   LEUKOCYTESUR NEGATIVE 08/04/2016 1824   Sepsis Labs: @LABRCNTIP (procalcitonin:4,lacticidven:4)  )No results found for this or any previous visit (from the past 240 hour(s)).   Invalid input(s): PROCALCITONIN, LACTICACIDVEN   Radiology Studies: Ct Head Wo Contrast  Result Date: 08/04/2016 CLINICAL DATA:  Left facial numbness EXAM: CT  HEAD WITHOUT CONTRAST TECHNIQUE: Contiguous axial images were obtained from the base of the skull through the vertex without intravenous contrast. COMPARISON:  None. FINDINGS: Brain: No evidence of acute infarction, hemorrhage, hydrocephalus, extra-axial collection or mass lesion/mass effect. Vascular: No hyperdense vessel or unexpected calcification. Skull: Normal. Negative for fracture or focal lesion. Sinuses/Orbits: No acute finding. Other: Cerebral volume is within normal limits. No ventriculomegaly. IMPRESSION: Normal head CT. Electronically Signed   By: Julian Hy M.D.   On: 08/04/2016 18:57   Mr Jodene Nam Head Wo Contrast  Result Date: 08/05/2016 CLINICAL DATA:  Left-sided numbness and weakness for 1 day. EXAM: MRI HEAD WITHOUT CONTRAST MRA HEAD WITHOUT CONTRAST TECHNIQUE: Multiplanar, multiecho pulse sequences of the brain and surrounding structures were obtained without intravenous contrast. Angiographic images of the head were obtained using MRA technique without contrast. COMPARISON:  Head CT 08/04/2016 FINDINGS: MRI HEAD FINDINGS Some sequences are mildly to moderately motion degraded. Brain: There is  a 2 mm punctate focus of abnormally increased trace diffusion signal in the right thalamus. No significantly reduced ADC is identified, however assessment is somewhat limited by the small size of the abnormality. There is no evidence of acute infarct elsewhere. There is no evidence of intracranial hemorrhage, mass, midline shift, or extra-axial fluid collection. A small cavum septum pellucidum et vergae is incidentally noted. There is minimal periventricular white matter T2 hyperintensity, nonspecific but may reflect early chronic small vessel ischemic disease in this patient with diabetes and hypertension. Vascular: Major intracranial vascular flow voids are preserved. Skull and upper cervical spine: Diffusely diminished bone marrow signal intensity is nonspecific but can be seen with anemia,  smoking, and obesity. Sinuses/Orbits: Grossly unremarkable orbits. No evidence of significant paranasal sinus disease. Other: None. MRA HEAD FINDINGS The visualized distal vertebral arteries are patent to the basilar with the left being dominant. AICA and SCA origins are patent. Basilar artery is widely patent. There are small posterior communicating arteries bilaterally. PCAs are patent without evidence of significant proximal stenosis. Internal carotid arteries are patent from skullbase to carotid termini with mild siphon irregularity but no evidence of significant stenosis. Focal signal dropout in the proximal petrous ICA on the left is considered artifactual. ACAs and MCAs are patent without evidence of major branch occlusion or significant proximal stenosis. Branch vessel evaluation is mildly limited by motion artifact. No intracranial aneurysm is identified. IMPRESSION: 1. 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 2. Suspected minimal chronic small vessel ischemic white matter disease. 3. No evidence of major intracranial arterial occlusion or significant proximal stenosis. Mildly limited branch vessel evaluation due to motion. Electronically Signed   By: Logan Bores M.D.   On: 08/05/2016 11:00   Mr Brain Wo Contrast  Result Date: 08/05/2016 CLINICAL DATA:  Left-sided numbness and weakness for 1 day. EXAM: MRI HEAD WITHOUT CONTRAST MRA HEAD WITHOUT CONTRAST TECHNIQUE: Multiplanar, multiecho pulse sequences of the brain and surrounding structures were obtained without intravenous contrast. Angiographic images of the head were obtained using MRA technique without contrast. COMPARISON:  Head CT 08/04/2016 FINDINGS: MRI HEAD FINDINGS Some sequences are mildly to moderately motion degraded. Brain: There is a 2 mm punctate focus of abnormally increased trace diffusion signal in the right thalamus. No significantly reduced ADC is identified, however assessment is somewhat limited by the small  size of the abnormality. There is no evidence of acute infarct elsewhere. There is no evidence of intracranial hemorrhage, mass, midline shift, or extra-axial fluid collection. A small cavum septum pellucidum et vergae is incidentally noted. There is minimal periventricular white matter T2 hyperintensity, nonspecific but may reflect early chronic small vessel ischemic disease in this patient with diabetes and hypertension. Vascular: Major intracranial vascular flow voids are preserved. Skull and upper cervical spine: Diffusely diminished bone marrow signal intensity is nonspecific but can be seen with anemia, smoking, and obesity. Sinuses/Orbits: Grossly unremarkable orbits. No evidence of significant paranasal sinus disease. Other: None. MRA HEAD FINDINGS The visualized distal vertebral arteries are patent to the basilar with the left being dominant. AICA and SCA origins are patent. Basilar artery is widely patent. There are small posterior communicating arteries bilaterally. PCAs are patent without evidence of significant proximal stenosis. Internal carotid arteries are patent from skullbase to carotid termini with mild siphon irregularity but no evidence of significant stenosis. Focal signal dropout in the proximal petrous ICA on the left is considered artifactual. ACAs and MCAs are patent without evidence of major branch occlusion or significant proximal  stenosis. Branch vessel evaluation is mildly limited by motion artifact. No intracranial aneurysm is identified. IMPRESSION: 1. 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 2. Suspected minimal chronic small vessel ischemic white matter disease. 3. No evidence of major intracranial arterial occlusion or significant proximal stenosis. Mildly limited branch vessel evaluation due to motion. Electronically Signed   By: Logan Bores M.D.   On: 08/05/2016 11:00        Scheduled Meds: .  stroke: mapping our early stages of recovery book   Does not  apply Once  . aspirin  300 mg Rectal Daily   Or  . aspirin  325 mg Oral Daily  . enoxaparin (LOVENOX) injection  0.5 mg/kg Subcutaneous Q24H  . hydrOXYzine  25 mg Oral QHS  . insulin aspart  0-15 Units Subcutaneous TID WC  . insulin glargine  10 Units Subcutaneous QHS   Continuous Infusions: . sodium chloride 50 mL/hr at 08/05/16 0131     LOS: 0 days    Time spent: 35 minutes    Korben Carcione A, MD [facility] Pager [phone]  If 7PM-7AM, please contact night-coverage [email or web address] Password [email or web address] 08/05/2016, 12:05 PM

## 2016-08-05 NOTE — Care Management Note (Signed)
Case Management Note  Patient Details  Name: [name] MRN: [id number] Date of Birth: 12-May-1971  Subjective/Objective:    Patient adm from home with paresthesia. Ind with ADL's. She does not have a PCP, transportation and is pending Medicaid application and possibility of being added to her husbands insurance.                 Action/Plan: PCP appointment made with Dr. Jani Gravel. CBG prescription form will be given once signed by MD. [facility] services of PT and OT will be arranged with [facility] as well as RW. Romualdo Bolk of [facility] notified and will obtain orders from chart. Patient aware [facility] has 48 hours to initiate services.   Expected Discharge Date:     08/05/2016     Expected Discharge Plan:  [place]  In-House Referral:  NA  Discharge planning Services  CM Consult  Post Acute Care Choice:  Durable Medical Equipment, Home Health Choice offered to:  Patient  DME Arranged:  [facility] DME Agency:  [facility] Arranged:  PT, OT [facility] Agency:  [place]  Status of Service:  In process, will continue to follow  If discussed at Long Length of Stay Meetings, dates discussed:    Additional Comments:  Bobbette Eakes, [place]un[place]y [place], RN 08/05/2016, 2:49 PM

## 2016-08-05 NOTE — Progress Notes (Signed)
Inpatient Diabetes Program Recommendations  AACE/ADA: New Consensus Statement on Inpatient Glycemic Control (2015)  Target Ranges:  Prepandial:   less than 140 mg/dL      Peak postprandial:   less than 180 mg/dL (1-2 hours)      Critically ill patients:  140 - 180 mg/dL   Results for SHAKIRRA, REM (MRN [id number]) as of 08/05/2016 09:20  Ref. Range 08/04/2016 18:22 08/05/2016 07:27  Glucose-Capillary Latest Ref Range: 65 - 99 mg/dL 402 (H) 303 (H)   Review of Glycemic Control  Diabetes history: DM2 Outpatient Diabetes medications: None (no meds in at least 1 month) Current orders for Inpatient glycemic control: Lantus 10 units QHS, Novolog 0-15 units TID with meals  Inpatient Diabetes Program Recommendations: Insulin - Basal: Since patient does not have any insurance she will need an affordable insulin if prescribed insulin at time of discharge. Recommend switching from Lantus to 70/30 now to determine effect on glycemic control. Please consider discontinuing Lantus and ordering 70/30 18 units BID (which will provide a total of 25 units for basal and 11 units for meal coverage per day). Insulin-correction: Please consider ordering Novolog bedtime correction scale.  NOTE: In reviewing chart, noted patient does not have any insurance and no PCP. Patient went to the [facility] in March 2016 and noted to be taking Metformin 1000 mg BID at that time.  No follow up visits at [facility] since then. Initial glucose 402 mg/dl on 08/04/16. Anticipate A1C will be >10% and patient will require insulin for glycemic control. Therefore, would recommend using 70/30 insulin since patient could purchase NOVOLIN 70/30 from [facility] for $25 per vial. Will plan to talk with patient today to discuss further.  Thanks, Barnie Alderman, RN, MSN, CDE Diabetes Coordinator Inpatient Diabetes Program [phone] (Team Pager from 8am to 5pm)

## 2016-08-05 NOTE — Progress Notes (Signed)
SLP Cancellation Note  Patient Details Name: Joanna Newman MRN: [id number] DOB: 10/18/1970   Cancelled treatment:       Reason Eval/Treat Not Completed: Patient at procedure or test/unavailable (MRI); Pt passed RN swallow screen, but is still NPO. SLP consulted for SLE. Will follow later this date.  Thank you,  Genene Churn, [place]    [place] 08/05/2016, 10:37 AM

## 2016-08-05 NOTE — Evaluation (Signed)
Speech Language Pathology Evaluation Patient Details Name: Joanna Newman MRN: [id number] DOB: 1971/07/08 Today's Date: 08/05/2016 Time: [id number] SLP Time Calculation (min) (ACUTE ONLY): 23 min  Problem List:  Patient Active Problem List   Diagnosis Date Noted  . Paresthesia 08/04/2016  . Hyponatremia 08/04/2016  . Essential hypertension 12/25/2014  . Insomnia 12/25/2014  . Asthma, chronic 12/25/2014  . Morbid obesity ([place]) 12/25/2014  . Bilateral ovarian cysts 12/16/2014  . Diabetes ([place]) 12/16/2014  . Morbid obesity with BMI of 50.0-59.9, adult ([place]) 05/20/2012  . Vaginal itching 05/20/2012   Past Medical History:  Past Medical History:  Diagnosis Date  . Anxiety   . Asthma   . Diabetes mellitus without complication ([place])   . Hypertension   . Insomnia   . Obesity   . Substance abuse    cocaine, marijuana and alcohol abuse. quit end of 2012   Past Surgical History:  Past Surgical History:  Procedure Laterality Date  . CHOLECYSTECTOMY    . KNEE SURGERY    . TUBAL LIGATION     HPI:  Joanna Newman is a 45 y.o. female with medical history significant of DM and HTN for which she has not been taking medication for at least a month presenting with concern for CVA.  Patient awoke with left sided facial numbness about 10AM.   Tongue, lips, face numb.  Noticed heaviness in left arm about 1:30.  Difficulty getting onto exercise bike, but otherwise able to do ADLs.  "Feels like someone is pouring cold water down my backside".  Left leg slightly weak but more subtle than arm and face.  No dysphagia.    Assessment / Plan / Recommendation Clinical Impression  Pt seen in room while sitting up in bed. Pt reports "numbness" on left side of face. Oral motor examination reveals mild left facial asymmetry with labial aversion, however upon lingual protrusion, the tongue deviated to far right side of mouth. Later in session, lingual protrusion was WNL. Pt reports that her husband was  telling her that her speech was "different". Pt also endorses mild word finding deficits, but improving. During today's assessment, pt was able to follow complex auditory directions, communicate moderately complex thoughts, and recall recent events. No further acute SLP needs identified at this time, however if pt does not feel like cognitive linguistic skills have returned to baseline once at home, pt may benefit from outpatient SLP evaluation and treatment. SLP will sign off at this time.     SLP Assessment  All further Speech Lanaguage Pathology  needs can be addressed in the next venue of care    Follow Up Recommendations  Outpatient SLP (If symptoms persist)    Frequency and Duration           SLP Evaluation Cognition  Overall Cognitive Status: Within Functional Limits for tasks assessed Arousal/Alertness: Awake/alert Orientation Level: Oriented X4 Memory: Appears intact Awareness: Appears intact Problem Solving: Appears intact Safety/Judgment: Appears intact       Comprehension  Auditory Comprehension Overall Auditory Comprehension: Appears within functional limits for tasks assessed Yes/No Questions: Within Functional Limits Commands: Within Functional Limits Conversation: Complex Visual Recognition/Discrimination Discrimination: Within Function Limits Reading Comprehension Reading Status: Not tested (reportedly doing ok sending/reading text messages)    Expression Expression Primary Mode of Expression: Verbal Verbal Expression Overall Verbal Expression: Appears within functional limits for tasks assessed Initiation: No impairment Automatic Speech: Name;Social Response Level of Generative/Spontaneous Verbalization: Conversation Repetition: No impairment Naming: No impairment Pragmatics: No impairment Written Expression Dominant  Hand: Right Written Expression: Not tested   Oral / Motor  Oral Motor/Sensory Function Overall Oral Motor/Sensory Function: Mild  impairment Facial ROM: Reduced left Facial Symmetry: Abnormal symmetry left Facial Strength: Within Functional Limits Facial Sensation: Reduced left Lingual Symmetry: Abnormal symmetry right (initially tongue deviated to the right, but not later) Lingual Strength: Within Functional Limits Lingual Sensation: Within Functional Limits Velum: Within Functional Limits Mandible: Within Functional Limits Motor Speech Overall Motor Speech: Appears within functional limits for tasks assessed Respiration: Within functional limits Phonation: Normal Resonance: Within functional limits Articulation: Within functional limitis Intelligibility: Intelligible Motor Planning: Witnin functional limits Motor Speech Errors: Not applicable   GO          Functional Assessment Tool Used: clinical judgment Functional Limitations: Spoken language expressive Spoken Language Expression Current Status [phone]): At least 1 percent but less than 20 percent impaired, limited or restricted Spoken Language Expression Goal Status [phone]): 0 percent impaired, limited or restricted Spoken Language Expression Discharge Status [phone]): At least 1 percent but less than 20 percent impaired, limited or restricted          Thank you,  Genene Churn, [place]  [place] 08/05/2016, 2:36 PM

## 2016-08-05 NOTE — Progress Notes (Addendum)
Spoke with patient regarding ineffective self-management of diabetes.  1.  Taught patient the following (teach back and/or return demonstration):      Hypo/Hyperglycemia (also to place these on refrigerator so others will know these)      How, when, and why to take insulin      Reli-on brand of insulin and diabetes products available at [employer]      How to store insulin      CBG monitoring and A1C      Long-term effects of high blood glucose levels      Why exercise is important      Carb modified diet - consult to RD made  2.  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  NOTE: has intolerance to Metformin (GI symptoms) but is willing to try Metformin XR; If patient discharged on Insulin - Please 1st start on Novolin products available at [employer] like Novolin 70/30 mix until has insurance verified)  3.  Identified support systems:  Husband and family  Thank you,  Windy Carina, RN, BSN Diabetes Coordinator Inpatient Diabetes Program [phone] (Team Pager)

## 2016-08-05 NOTE — Evaluation (Signed)
Occupational Therapy Evaluation Patient Details Name: Joanna Newman MRN: [id number] DOB: 05-29-1971 Today's Date: 08/05/2016    History of Present Illness 45 y.o. female with medical history significant of DM and HTN for which she has not been taking medication for at least a month presenting with concern for CVA.  Patient awoke with left sided facial numbness about 10AM.   Tongue, lips, face numb.  Noticed heaviness in left arm about 1:30.  Difficulty getting onto exercise bike, but otherwise able to do ADLs.  "Feels like someone is pouring cold water down my backside".  Left leg slightly weak but more subtle than arm and face.  No dysphagia.  Notices a bit of trouble pronouncing certain words.   Clinical Impression   Pt awake, alert, oriented x4 this am, agreeable to OT evaluation, PT joined during evaluation. Pt demonstrates decreased light touch sensation along dorsal forearm, wrist, and palm, and decreased strength in LUE. Strength is 3/5 throughout with limited grip and pinch strength, as well as decreased coordination. Pt requires increased time for ADL performance due to limited strength and coordination in LUE. Recommend HHOT services to further assess pt independence and safety in home environment, as well as work on improving strength and coordination in [place]. Also recommend tub bench for pt safety. Will continue to see pt while in acute care.     Follow Up Recommendations  Home health OT    Equipment Recommendations  Tub/shower bench       Precautions / Restrictions Precautions Precautions: Fall Restrictions Weight Bearing Restrictions: No      Mobility Bed Mobility Overal bed mobility: Modified Independent                Transfers Overall transfer level: Needs assistance   Transfers: Sit to/from Stand Sit to Stand: Min guard                   ADL Overall ADL's : Needs assistance/impaired                     Lower Body Dressing:  Supervision/safety;Sitting/lateral leans Lower Body Dressing Details (indicate cue type and reason): Pt requires increased time to donn socks using RUE only, difficulty coordinating LUE for assist with dressing. Is able to weightbear on LUE during dressing task.              Functional mobility during ADLs: Min guard General ADL Comments: Pt is unsteady with functional mobility, reports she has been holding onto walls during the night when up to use restroom     Vision Vision Assessment?: Yes Eye Alignment: Within Functional Limits Ocular Range of Motion: Within Functional Limits Alignment/Gaze Preference: Within Defined Limits Tracking/Visual Pursuits: Able to track stimulus in all quads without difficulty Saccades: Within functional limits Convergence: Within functional limits Visual Fields: No apparent deficits          Pertinent Vitals/Pain Pain Assessment: No/denies pain     Hand Dominance Right   Extremity/Trunk Assessment Upper Extremity Assessment Upper Extremity Assessment: LUE deficits/detail LUE Deficits / Details: Pt with weakness in LUE, strength 3/5 throughout, grip strength decreased.  LUE Sensation: decreased light touch LUE Coordination: decreased fine motor   Lower Extremity Assessment Lower Extremity Assessment: Defer to PT evaluation       Communication Communication Communication: No difficulties   Cognition Arousal/Alertness: Lethargic Behavior During Therapy: WFL for tasks assessed/performed Overall Cognitive Status: Within Functional Limits for tasks assessed  Home Living Family/patient expects to be discharged to:: Private residence Living Arrangements: Spouse/significant other (truck driver, only home on weekends) Available Help at Discharge: Family;Available PRN/intermittently Type of Home: House Home Access: Stairs to enter [employer] of Steps: 4 Entrance Stairs-Rails: Can reach  both Home Layout: One level     Bathroom Shower/Tub: Teacher, early years/pre: Standard     Home Equipment: Cane - single point;Bedside commode          Prior Functioning/Environment Level of Independence: Independent                 OT Problem List: Decreased strength;Decreased activity tolerance;Impaired balance (sitting and/or standing);Decreased coordination;Decreased safety awareness;Impaired UE functional use   OT Treatment/Interventions: Self-care/ADL training;Therapeutic exercise;Neuromuscular education;Therapeutic activities;Patient/family education    OT Goals(Current goals can be found in the care plan section) Acute Rehab OT Goals Patient Stated Goal: To get her feeling back OT Goal Formulation: With patient Time For Goal Achievement: 08/19/16 Potential to Achieve Goals: Good  OT Frequency: Min 2X/week           Co-evaluation PT/OT/SLP Co-Evaluation/Treatment: Yes Reason for Co-Treatment: Complexity of the patient's impairments (multi-system involvement) PT goals addressed during session: Mobility/safety with mobility;Balance;Strengthening/ROM OT goals addressed during session: ADL's and self-care      End of Session Equipment Utilized During Treatment: Gait belt  Activity Tolerance: Patient tolerated treatment well Patient left: in bed;with call bell/phone within reach;with bed alarm set   Time: [id number] OT Time Calculation (min): 22 min Charges:  OT General Charges $OT Visit: 1 Procedure OT Evaluation $OT Eval Low Complexity: 1 Procedure G-Codes: OT G-codes **NOT FOR INPATIENT CLASS** Functional Assessment Tool Used: clinical judgement Functional Limitation: Self care Self Care Current Status [id number]): At least 20 percent but less than 40 percent impaired, limited or restricted Self Care Goal Status [id number]): At least 1 percent but less than 20 percent impaired, limited or restricted   Guadelupe Sabin, OTR/L   [phone] 08/05/2016, 9:18 AM

## 2016-08-05 NOTE — Progress Notes (Signed)
Initial Nutrition Assessment  DOCUMENTATION CODES:  Morbid obesity  INTERVENTION:  Wt loss and DM education. Very productive. Handouts given include "Wt loss tips" from [employer], a sample menu for 1500 kcal diet and a copy of the Plate Method Template for diabetics.    -She has already made many good changes on her own. She has many obstacles including severe sugar cravings, finances, fear of medication side effects, upsetting social dynamic.   She is agreeable and desires more education if free. She asked about classes and if there was access to free exercise equipment. Contact information provided  for [facility]  See note body for more detail.   NUTRITION DIAGNOSIS:  Food and nutrition related knowledge deficit related to limited prior education as evidenced by Morbid obesity and hyperglycemia.  GOAL:  Better glycemic control, wt loss of 1-2 lbs per week  MONITOR:  PO intake, Labs, Weight trends (Need for further education)  REASON FOR ASSESSMENT:  Consult "Caloric needs"/ wt loss  ASSESSMENT:  45 y/o female PMHx DM, HTN, substance abuse, anxiety, morbid obesity. Presents with Left sided facial numbness and slight Left leg weakness. Had stopped taking DM/HTN meds.   Had very lengthy and  productive education session with pt about BG and wt management. She is highly motivated.   She has many reported obstacles to BG and weight management She notes finances are a concern. She is worried about medication side effects : "All of these medications cant be good for me". She heard the commercials about lawsuits over medications (metformin) which have worried her. This is one of reasons she stopped taking the metformin. It is emphasized to patient that though certain medications come with significant potential side effects, a provider will never ask her to take a medication if the risks outweigh the benefits of that medication.  She currently needs this to manage her BG. She seemed comforted by this. She also notes meals and cooking play a large part in the social dynamic of her family. "Soul food" is a staple and she does not want to "upset this quality time"   RD went through dietary recall. Several problem areas were identified.   Breakfast: 2 boiled eggs, [place] sausage, homemade smoothie that includes: Spinach, protein powder, greek yogurt, sometimes juice, and bananas. She says this meal fills her up so much that she wont eat again till 10:00 pm "all of sudden I say, wow im hungry" and she will eat a hoagie before bed.  Beverages: Mostly water, also G2, V8 berry fusion, diet soda, occasional regular soda, sugar free flavor packets.   Other: She eats a large quantity of fruit, specifically mangos. 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 She eats potatoes and corn frequently. She says her baked potatoes/yams are topped with brown sugar. She says she cannot afford whole grain bread over white.   RD educated on what the starchy vegetables were and how to incorporate them into a healthy diets. Discussed "Plate method" and appropriate food group ratios at meals.   We discussed concept of not eating her fruit by itself. Talked about pairing carbs and protein.   She adds much sugar/cream to coffee. Asked her to reduce. Asked to stop the V8 juice. Dicussed healthy smoothies-Though  can be very healthy, asked her to be more cognizant of the amount of juice and fruit she places in them. Recommended more vegetables  or PB.   Discussed importance of not skipping meals and how skipping can lead to erratic BG control and overeating.   She had many nutritional questions such as safety of artifical sweeteners and how she could still do wine tasting with her friends and cooking with her family each Sunday. This is typically a "soul food" meal with very high carb/sodium  items.   She has had problems with extremely intense sugar cravings that hit her frequently such as when grocery shopping and passing candy isle or when she is baking. She had grown up on high sodium/sugar diet and as a result, "food wont go down" without sugar or salt. She says that some times she "has to have" a regular soda as diet does not satisfy cravings.   Summary of recommendations 1. Do not skip meals. Eat 3 x a day, with appropriate snacks if needed 2. Limit intake of potateos, peas, corn. Watch portions 3. Careful of sugary beverages. Decrease sugar in coffee from 3 tbsp to </= 1 and </= 1 tbsp creamer 4. Pair fruits/carbs with protein/fiber ie plate method-no eating carbs alone.   Expect Good compliance.  She has already made many beneficial changes on her own. She has started using smaller dinner ware, increased water consumption, using exercise bike. She asked for additional handouts and information on free education sessions.   Her standing weight was measured as 284 lbs, 11 oz, her wt of 278 may have been reported.  She has lost 23 lbs since January (7%) and > 40 lbs in 20 months, though unclear if this loss was due to poor glycemic control, intentional wt loss with diet/excercise or both.   Labs: CBGs> 300, Poor Lipid panel results noted w/  high TG, LDL, VLDL and low HDL. A1C pending Medications: Insulin, metformin,  NS infusion   Recent Labs Lab 08/04/16 1900  NA 130*  K 4.0  CL 97*  CO2 25  BUN 13  CREATININE 0.99  CALCIUM 9.2  GLUCOSE 419*   Diet Order:  Diet Carb Modified Fluid consistency: Thin; Room service appropriate? Yes  Skin:  Reviewed, no issues   Last BM:  10/25  Height:  Ht Readings from Last 1 Encounters:  08/05/16 5\' 4"  (1.626 m)   Weight:  Wt Readings from Last 1 Encounters:  08/05/16 284 lb 11.2 oz (129.1 kg)   Wt Readings from Last 10 Encounters:  08/05/16 284 lb 11.2 oz (129.1 kg)  12/09/15 300 lb (136.1 kg)  11/02/15 (!) 307 lb  (139.3 kg)  03/21/15 (!) 306 lb 11.2 oz (139.1 kg)  01/30/15 (!) 310 lb 4.8 oz (140.8 kg)  12/25/14 (!) 313 lb 3.2 oz (142.1 kg)  12/16/14 (!) 318 lb 8 oz (144.5 kg)  12/05/14 (!) 314 lb (142.4 kg)  11/08/14 (!) 324 lb (147 kg)  10/30/14 (!) 326 lb 1.6 oz (147.9 kg)   Ideal Body Weight:  54.54 kg  BMI:  Body mass index is 48.87 kg/m.  Estimated Nutritional Needs:  Kcal:  1300-1500 kcals (for wt loss) Protein:  65-75 g Pro (20% of kcals) Fluid:  >1.5 Liters   EDUCATION NEEDS:  Education needs addressed  Burtis Junes RD, LDN, CNSC Clinical Nutrition Pager: [phone] 08/05/2016 5:45 PM

## 2016-08-06 ENCOUNTER — Inpatient Hospital Stay (HOSPITAL_COMMUNITY): Payer: Self-pay

## 2016-08-06 DIAGNOSIS — I639 Cerebral infarction, unspecified: Secondary | ICD-10-CM | POA: Diagnosis present

## 2016-08-06 DIAGNOSIS — Z794 Long term (current) use of insulin: Secondary | ICD-10-CM

## 2016-08-06 DIAGNOSIS — G459 Transient cerebral ischemic attack, unspecified: Secondary | ICD-10-CM

## 2016-08-06 LAB — ECHOCARDIOGRAM COMPLETE
HEIGHTINCHES: 64 in
Weight: 4555.2 oz

## 2016-08-06 LAB — HEMOGLOBIN A1C: Mean Plasma Glucose: >398 mg/dL

## 2016-08-06 LAB — GLUCOSE, CAPILLARY
GLUCOSE-CAPILLARY: 153 mg/dL — AB (ref 65–99)
Glucose-Capillary: 221 mg/dL — ABNORMAL HIGH (ref 65–99)

## 2016-08-06 MED ORDER — ATORVASTATIN CALCIUM 20 MG PO TABS: 20.0000 mg | ORAL_TABLET | Freq: Every day | ORAL | 0 refills | Status: AC

## 2016-08-06 MED ORDER — ASPIRIN 325 MG PO TBEC: 325.0000 mg | DELAYED_RELEASE_TABLET | Freq: Every day | ORAL | Status: AC

## 2016-08-06 MED ORDER — FLUCONAZOLE 100 MG PO TABS
150.0000 mg | ORAL_TABLET | Freq: Once | ORAL | Status: AC
  Administered 2016-08-06: 150 mg via ORAL
  Filled 2016-08-06: qty 2

## 2016-08-06 MED ORDER — METFORMIN HCL 500 MG PO TABS: 500.0000 mg | ORAL_TABLET | Freq: Two times a day (BID) | ORAL | 2 refills | Status: DC

## 2016-08-06 MED ORDER — AMLODIPINE BESYLATE 2.5 MG PO TABS: 2.5000 mg | ORAL_TABLET | Freq: Every day | ORAL | 0 refills | Status: DC

## 2016-08-06 MED ORDER — DIPHENHYDRAMINE HCL 12.5 MG/5ML PO ELIX
25.0000 mg | ORAL_SOLUTION | Freq: Once | ORAL | Status: AC
  Administered 2016-08-06: 25 mg via ORAL
  Filled 2016-08-06: qty 10

## 2016-08-06 MED ORDER — INSULIN NPH ISOPHANE & REGULAR (70-30) 100 UNIT/ML ~~LOC~~ SUSP: 40.0000 [IU] | Freq: Two times a day (BID) | SUBCUTANEOUS | 11 refills | Status: DC

## 2016-08-06 NOTE — Progress Notes (Signed)
*  PRELIMINARY RESULTS* Echocardiogram 2D Echocardiogram has been performed.  Joanna Newman 08/06/2016, 2:40 PM

## 2016-08-06 NOTE — Care Management (Signed)
Patient discount script for CBG meter and supplies given to patient and will need to be signed by MD. Patient and MD notified that form is in patient's room and needs to be signed. Patient agreeable.

## 2016-08-06 NOTE — Discharge Summary (Addendum)
Physician Discharge Summary  Joanna Newman [id number] DOB: 20-Oct-1970 DOA: 08/04/2016  PCP: No PCP Per Patient  Admit date: 08/04/2016 Discharge date: 08/06/2016  Admitted From: Home Disposition: Home  Recommendations for Outpatient Follow-up:  1. Follow up with PCP in 1-2 weeks 2. Please obtain BMP/CBC in one week  Home Health: PT/OT Equipment/Devices: Glucometer  Discharge Condition: Stable CODE STATUS: Full code Diet recommendation: Carb modified diet  Brief/Interim Summary: Joanna Newman is a 45 y.o. female with medical history significant of DM and HTN for which she has not been taking medication for at least a month presenting with concern for CVA.  Patient awoke with left sided facial numbness about 10AM.   Tongue, lips, face numb.  Noticed heaviness in left arm about 1:30.  Difficulty getting onto exercise bike, but otherwise able to do ADLs.  "Feels like someone is pouring cold water down my backside".  Left leg slightly weak but more subtle than arm and face.  No dysphagia.  Notices a bit of trouble pronouncing certain words. In school, took an exam last night - unremarkable evening. She did have extreme stress yesterday, had money issues and problems with bill collectors.  "was over the top, not sure if that has anything to do with it."  Stopped metformin about a month ago - felt like she had a constant stomach virus. chronic diarrhea.  Taking vinegar and cinnamon in AM, "trying to do a natural thing".  Did not have problems with BP medicine, just ran out about a month ago and has financial problems and no current PCP.    Discharge Diagnoses:  Principal Problem:   Acute CVA (cerebrovascular accident) ([place]) Active Problems:   Diabetes ([place])   Essential hypertension   Morbid obesity ([place])   Paresthesia   Hyponatremia   Acute CVA -Punctate acute right thalamic infarct on MRI. -Presented with Left facial and arm/leg weakness and numbness. -MRI/MRA showed  punctate acute right thalamic infarct -Carotid dopplers without significant stenosis -Echo did not show any evidence of intraventricular sources of clots, LVEF is 60-65%. -Telemetry without evidence of atrial arrhythmias. -Was on low-dose aspirin, this is switched to full dose. -Risk factors including uncontrolled diabetes mellitus type 2 and dyslipidemia. -Discussed briefly with neurology, modify risk factors, no recommendation for implantable loop recorder for now.  Diabetes mellitus type 2, uncontrolled -Poor glycemic control, hemoglobin A1c is >15.5. -Reported inability to tolerate metformin at home, she was able to take 500 mg twice a day the hospital without symptoms. -Does not have insurance, started on 70/30 Novolin mix at 40 units twice a day. -Patient to follow-up with PCP  HTN -Allowed permissive HTN on admission -Allergic to lisinopril, started on low-dose amlodipine.  Dyslipidemia -Start statin, total cholesterol is 197, LDL is 118.  Obesity -Nutrition consult -Needs weight loss  Hyponatremia -Pseudohyponatremia, Corrected for glucose, Na++ is 135  Discharge Instructions  Discharge Instructions    Diet - low sodium heart healthy    Complete by:  As directed    Increase activity slowly    Complete by:  As directed        Medication List    TAKE these medications   AMBIEN 10 MG tablet Generic drug:  zolpidem Take 10 mg by mouth at bedtime as needed for sleep.   amLODipine 2.5 MG tablet Commonly known as:  NORVASC Take 1 tablet (2.5 mg total) by mouth daily.   aspirin 325 MG EC tablet Take 1 tablet (325 mg total) by mouth daily. What changed:  medication strength  how much to take   atorvastatin 20 MG tablet Commonly known as:  LIPITOR Take 1 tablet (20 mg total) by mouth daily at 6 PM.   hydrOXYzine 25 MG capsule Commonly known as:  VISTARIL Take 25 mg by mouth at bedtime.   insulin NPH-regular Human (70-30) 100 UNIT/ML  injection Commonly known as:  NOVOLIN 70/30 RELION Inject 40 Units into the skin 2 (two) times daily with a meal.   metFORMIN 500 MG tablet Commonly known as:  GLUCOPHAGE Take 1 tablet (500 mg total) by mouth 2 (two) times daily with a meal.      Follow-up Information    Jani Gravel, MD Follow up on 08/18/2016.   Specialty:  Internal Medicine Why:  Your appointment is November 8th at 2:45, please go to the office prior to this appt and fill out your paperwork- you will need a PCP in order to have your Home health services, so please go to this appointment. Contact information: [place] [address] [phone]          Allergies  Allergen Reactions  . Lisinopril Swelling    Consultations:  None   Procedures/Studies: 2-D echo Impressions: - Moderate LVH with LVEF 60-65%. Grossly normal diastolic function.   Trivial mitral and tricuspid regurgitation. Cannot exclude PFO,   consider agitated saline contrast study. PASP 20 mmHg.  Ct Head Wo Contrast  Result Date: 08/04/2016 CLINICAL DATA:  Left facial numbness EXAM: CT HEAD WITHOUT CONTRAST TECHNIQUE: Contiguous axial images were obtained from the base of the skull through the vertex without intravenous contrast. COMPARISON:  None. FINDINGS: Brain: No evidence of acute infarction, hemorrhage, hydrocephalus, extra-axial collection or mass lesion/mass effect. Vascular: No hyperdense vessel or unexpected calcification. Skull: Normal. Negative for fracture or focal lesion. Sinuses/Orbits: No acute finding. Other: Cerebral volume is within normal limits. No ventriculomegaly. IMPRESSION: Normal head CT. Electronically Signed   By: Julian Hy M.D.   On: 08/04/2016 18:57   Mr Jodene Nam Head Wo Contrast  Result Date: 08/05/2016 CLINICAL DATA:  Left-sided numbness and weakness for 1 day. EXAM: MRI HEAD WITHOUT CONTRAST MRA HEAD WITHOUT CONTRAST TECHNIQUE: Multiplanar, multiecho pulse sequences of the brain and  surrounding structures were obtained without intravenous contrast. Angiographic images of the head were obtained using MRA technique without contrast. COMPARISON:  Head CT 08/04/2016 FINDINGS: MRI HEAD FINDINGS Some sequences are mildly to moderately motion degraded. Brain: There is a 2 mm punctate focus of abnormally increased trace diffusion signal in the right thalamus. No significantly reduced ADC is identified, however assessment is somewhat limited by the small size of the abnormality. There is no evidence of acute infarct elsewhere. There is no evidence of intracranial hemorrhage, mass, midline shift, or extra-axial fluid collection. A small cavum septum pellucidum et vergae is incidentally noted. There is minimal periventricular white matter T2 hyperintensity, nonspecific but may reflect early chronic small vessel ischemic disease in this patient with diabetes and hypertension. Vascular: Major intracranial vascular flow voids are preserved. Skull and upper cervical spine: Diffusely diminished bone marrow signal intensity is nonspecific but can be seen with anemia, smoking, and obesity. Sinuses/Orbits: Grossly unremarkable orbits. No evidence of significant paranasal sinus disease. Other: None. MRA HEAD FINDINGS The visualized distal vertebral arteries are patent to the basilar with the left being dominant. AICA and SCA origins are patent. Basilar artery is widely patent. There are small posterior communicating arteries bilaterally. PCAs are patent without evidence of significant proximal stenosis. Internal carotid arteries  are patent from skullbase to carotid termini with mild siphon irregularity but no evidence of significant stenosis. Focal signal dropout in the proximal petrous ICA on the left is considered artifactual. ACAs and MCAs are patent without evidence of major branch occlusion or significant proximal stenosis. Branch vessel evaluation is mildly limited by motion artifact. No intracranial  aneurysm is identified. IMPRESSION: 1. 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 2. Suspected minimal chronic small vessel ischemic white matter disease. 3. No evidence of major intracranial arterial occlusion or significant proximal stenosis. Mildly limited branch vessel evaluation due to motion. Electronically Signed   By: Logan Bores M.D.   On: 08/05/2016 11:00   Mr Brain Wo Contrast  Result Date: 08/05/2016 CLINICAL DATA:  Left-sided numbness and weakness for 1 day. EXAM: MRI HEAD WITHOUT CONTRAST MRA HEAD WITHOUT CONTRAST TECHNIQUE: Multiplanar, multiecho pulse sequences of the brain and surrounding structures were obtained without intravenous contrast. Angiographic images of the head were obtained using MRA technique without contrast. COMPARISON:  Head CT 08/04/2016 FINDINGS: MRI HEAD FINDINGS Some sequences are mildly to moderately motion degraded. Brain: There is a 2 mm punctate focus of abnormally increased trace diffusion signal in the right thalamus. No significantly reduced ADC is identified, however assessment is somewhat limited by the small size of the abnormality. There is no evidence of acute infarct elsewhere. There is no evidence of intracranial hemorrhage, mass, midline shift, or extra-axial fluid collection. A small cavum septum pellucidum et vergae is incidentally noted. There is minimal periventricular white matter T2 hyperintensity, nonspecific but may reflect early chronic small vessel ischemic disease in this patient with diabetes and hypertension. Vascular: Major intracranial vascular flow voids are preserved. Skull and upper cervical spine: Diffusely diminished bone marrow signal intensity is nonspecific but can be seen with anemia, smoking, and obesity. Sinuses/Orbits: Grossly unremarkable orbits. No evidence of significant paranasal sinus disease. Other: None. MRA HEAD FINDINGS The visualized distal vertebral arteries are patent to the basilar with the left being  dominant. AICA and SCA origins are patent. Basilar artery is widely patent. There are small posterior communicating arteries bilaterally. PCAs are patent without evidence of significant proximal stenosis. Internal carotid arteries are patent from skullbase to carotid termini with mild siphon irregularity but no evidence of significant stenosis. Focal signal dropout in the proximal petrous ICA on the left is considered artifactual. ACAs and MCAs are patent without evidence of major branch occlusion or significant proximal stenosis. Branch vessel evaluation is mildly limited by motion artifact. No intracranial aneurysm is identified. IMPRESSION: 1. 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 2. Suspected minimal chronic small vessel ischemic white matter disease. 3. No evidence of major intracranial arterial occlusion or significant proximal stenosis. Mildly limited branch vessel evaluation due to motion. Electronically Signed   By: Logan Bores M.D.   On: 08/05/2016 11:00   US Carotid Bilateral (at [facility] Only)  Result Date: 08/05/2016 CLINICAL DATA:  TIA.  Left facial numbness for 1 day. EXAM: BILATERAL CAROTID DUPLEX ULTRASOUND TECHNIQUE: Pearline Cables scale imaging, color Doppler and duplex ultrasound were performed of bilateral carotid and vertebral arteries in the neck. COMPARISON:  None. FINDINGS: Criteria: Quantification of carotid stenosis is based on velocity parameters that correlate the residual internal carotid diameter with NASCET-based stenosis levels, using the diameter of the distal internal carotid lumen as the denominator for stenosis measurement. The following velocity measurements were obtained: RIGHT ICA:  83 cm/sec CCA:  87 cm/sec SYSTOLIC ICA/CCA RATIO:  1 DIASTOLIC ICA/CCA  RATIO:  1.4 ECA:  45 cm/sec LEFT ICA:  71 cm/sec CCA:  59 cm/sec SYSTOLIC ICA/CCA RATIO:  1.2 DIASTOLIC ICA/CCA RATIO:  2.0 ECA:  61 cm/sec RIGHT CAROTID ARTERY: Mild soft plaque in the bulb. Low resistance  internal carotid Doppler pattern. RIGHT VERTEBRAL ARTERY:  Antegrade. LEFT CAROTID ARTERY: Minimal plaque in the bulb. Low resistance internal carotid Doppler pattern. LEFT VERTEBRAL ARTERY:  Antegrade. IMPRESSION: Less than 50% stenosis in the right and left internal carotid arteries. Electronically Signed   By: Marybelle Killings M.D.   On: 08/05/2016 13:23     Subjective:   Discharge Exam: Vitals:   08/06/16 0518 08/06/16 0918  BP: 117/80 137/74  Pulse: 70 75  Resp: 20 18  Temp: 98.7 F (37.1 C) 97.5 F (36.4 C)   Vitals:   08/05/16 2118 08/05/16 2300 08/06/16 0518 08/06/16 0918  BP: 111/73 136/86 117/80 137/74  Pulse: 75 77 70 75  Resp: 18 20 20 18   Temp: 98.6 F (37 C) 97.7 F (36.5 C) 98.7 F (37.1 C) 97.5 F (36.4 C)  TempSrc: Oral Oral Oral Oral  SpO2: 99% 98% 100% 99%  Weight:      Height:        General: Pt is alert, awake, not in acute distress Cardiovascular: RRR, S1/S2 +, no rubs, no gallops Respiratory: CTA bilaterally, no wheezing, no rhonchi Abdominal: Soft, NT, ND, bowel sounds + Extremities: no edema, no cyanosis    The results of significant diagnostics from this hospitalization (including imaging, microbiology, ancillary and laboratory) are listed below for reference.     Microbiology: No results found for this or any previous visit (from the past 240 hour(s)).   Labs: BNP (last 3 results) No results for input(s): BNP in the last 8760 hours. Basic Metabolic Panel:  Recent Labs Lab 08/04/16 1900  NA 130*  K 4.0  CL 97*  CO2 25  GLUCOSE 419*  BUN 13  CREATININE 0.99  CALCIUM 9.2   Liver Function Tests: No results for input(s): AST, ALT, ALKPHOS, BILITOT, PROT, ALBUMIN in the last 168 hours. No results for input(s): LIPASE, AMYLASE in the last 168 hours. No results for input(s): AMMONIA in the last 168 hours. CBC:  Recent Labs Lab 08/04/16 1900  WBC 9.0  NEUTROABS 4.1  HGB 14.5  HCT 42.9  MCV 86.8  PLT 404*   Cardiac  Enzymes:  Recent Labs Lab 08/04/16 1900  TROPONINI <0.03   BNP: Invalid input(s): POCBNP CBG:  Recent Labs Lab 08/05/16 1113 08/05/16 1647 08/05/16 2118 08/06/16 0733 08/06/16 1131  GLUCAP 305* 221* 162* 221* 153*   D-Dimer No results for input(s): DDIMER in the last 72 hours. Hgb A1c  Recent Labs  08/04/16 1900  HGBA1C >15.5*   Lipid Profile  Recent Labs  08/05/16 0637  CHOL 197  HDL 36*  LDLCALC 118*  TRIG 217*  CHOLHDL 5.5   Thyroid function studies No results for input(s): TSH, T4TOTAL, T3FREE, THYROIDAB in the last 72 hours.  Invalid input(s): FREET3 Anemia work up No results for input(s): VITAMINB12, FOLATE, FERRITIN, TIBC, IRON, RETICCTPCT in the last 72 hours. Urinalysis    Component Value Date/Time   COLORURINE YELLOW 08/04/2016 1824   APPEARANCEUR CLEAR 08/04/2016 1824   LABSPEC 1.010 08/04/2016 1824   PHURINE 6.0 08/04/2016 1824   GLUCOSEU >1000 (A) 08/04/2016 1824   HGBUR SMALL (A) 08/04/2016 1824   BILIRUBINUR NEGATIVE 08/04/2016 1824   BILIRUBINUR neg 12/25/2014 [place] 08/04/2016 1824  PROTEINUR NEGATIVE 08/04/2016 1824   UROBILINOGEN 0.2 12/25/2014 1123   UROBILINOGEN 0.2 12/05/2014 0634   NITRITE NEGATIVE 08/04/2016 1824   LEUKOCYTESUR NEGATIVE 08/04/2016 1824   Sepsis Labs Invalid input(s): PROCALCITONIN,  WBC,  LACTICIDVEN Microbiology No results found for this or any previous visit (from the past 240 hour(s)).   Time coordinating discharge: Over 30 minutes  SIGNED:   Birdie Hopes, MD  [facility] 08/06/2016, 11:40 AM Pager   If 7PM-7AM, please contact night-coverage [email or web address] Password [email or web address]

## 2016-09-16 DIAGNOSIS — E119 Type 2 diabetes mellitus without complications: Secondary | ICD-10-CM | POA: Diagnosis not present

## 2016-09-16 DIAGNOSIS — I639 Cerebral infarction, unspecified: Secondary | ICD-10-CM | POA: Diagnosis not present

## 2016-09-16 DIAGNOSIS — I10 Essential (primary) hypertension: Secondary | ICD-10-CM | POA: Diagnosis not present

## 2016-10-25 DIAGNOSIS — E119 Type 2 diabetes mellitus without complications: Secondary | ICD-10-CM | POA: Diagnosis not present

## 2016-10-25 DIAGNOSIS — M79604 Pain in right leg: Secondary | ICD-10-CM | POA: Diagnosis not present

## 2017-01-14 ENCOUNTER — Encounter (HOSPITAL_COMMUNITY): Payer: Self-pay | Admitting: *Deleted

## 2017-01-19 ENCOUNTER — Encounter (HOSPITAL_COMMUNITY): Payer: Self-pay | Admitting: *Deleted

## 2017-01-19 ENCOUNTER — Emergency Department (HOSPITAL_COMMUNITY): Payer: BLUE CROSS/BLUE SHIELD

## 2017-01-19 ENCOUNTER — Emergency Department (HOSPITAL_COMMUNITY)
Admission: EM | Admit: 2017-01-19 | Discharge: 2017-01-19 | Disposition: A | Discharge status: Home / Self Care | Payer: BLUE CROSS/BLUE SHIELD | Attending: Emergency Medicine | Admitting: Emergency Medicine

## 2017-01-19 DIAGNOSIS — Z7984 Long term (current) use of oral hypoglycemic drugs: Secondary | ICD-10-CM | POA: Insufficient documentation

## 2017-01-19 DIAGNOSIS — E119 Type 2 diabetes mellitus without complications: Secondary | ICD-10-CM | POA: Insufficient documentation

## 2017-01-19 DIAGNOSIS — F1290 Cannabis use, unspecified, uncomplicated: Secondary | ICD-10-CM | POA: Insufficient documentation

## 2017-01-19 DIAGNOSIS — Z7982 Long term (current) use of aspirin: Secondary | ICD-10-CM | POA: Insufficient documentation

## 2017-01-19 DIAGNOSIS — Y939 Activity, unspecified: Secondary | ICD-10-CM | POA: Insufficient documentation

## 2017-01-19 DIAGNOSIS — S8264XA Nondisplaced fracture of lateral malleolus of right fibula, initial encounter for closed fracture: Secondary | ICD-10-CM | POA: Diagnosis not present

## 2017-01-19 DIAGNOSIS — J45909 Unspecified asthma, uncomplicated: Secondary | ICD-10-CM | POA: Insufficient documentation

## 2017-01-19 DIAGNOSIS — Y929 Unspecified place or not applicable: Secondary | ICD-10-CM | POA: Insufficient documentation

## 2017-01-19 DIAGNOSIS — I10 Essential (primary) hypertension: Secondary | ICD-10-CM | POA: Diagnosis not present

## 2017-01-19 DIAGNOSIS — W010XXA Fall on same level from slipping, tripping and stumbling without subsequent striking against object, initial encounter: Secondary | ICD-10-CM | POA: Insufficient documentation

## 2017-01-19 DIAGNOSIS — Z87891 Personal history of nicotine dependence: Secondary | ICD-10-CM | POA: Insufficient documentation

## 2017-01-19 DIAGNOSIS — S8261XA Displaced fracture of lateral malleolus of right fibula, initial encounter for closed fracture: Secondary | ICD-10-CM | POA: Diagnosis not present

## 2017-01-19 DIAGNOSIS — Y999 Unspecified external cause status: Secondary | ICD-10-CM | POA: Diagnosis not present

## 2017-01-19 DIAGNOSIS — S99911A Unspecified injury of right ankle, initial encounter: Secondary | ICD-10-CM | POA: Diagnosis present

## 2017-01-19 MED ORDER — HYDROCODONE-ACETAMINOPHEN 5-325 MG PO TABS: ORAL_TABLET | ORAL | 0 refills | Status: DC

## 2017-01-19 NOTE — ED Triage Notes (Signed)
Pt c/o pain to right ankle after falling x 5 days ago; ankle has swelling to lateral ankle

## 2017-01-19 NOTE — Discharge Instructions (Signed)
Elevate and apply ice packs on/off to your ankle.  Continue the ibuprofen.  Minimal walking or standing.  Call Dr. Ruthe Mannan office to arrange a follow-up appt and to have the bony irregularity of your foot that we discussed further evaluated.

## 2017-01-20 ENCOUNTER — Telehealth: Payer: Self-pay | Admitting: Orthopaedic Surgery

## 2017-01-20 NOTE — Telephone Encounter (Signed)
Patient called to relay that she was seen at [facility] for problem of fracture of right fibula.  She requests appointment for next week. Offered Monday, and patient elects to wait until next Wednesday, 01/27/17.  Appointment scheduled accordingly, and advised patient to call if she is able to come prior to this date.

## 2017-01-21 NOTE — ED Provider Notes (Signed)
[facility] DEPT Provider Note   CSN: [id number] Arrival date & time: 01/19/17  1936     History   Chief Complaint Chief Complaint  Patient presents with  . Fall    HPI Joanna Newman is a 46 y.o. female.  HPI  Joanna Newman is a 46 y.o. female who presents to the Emergency Department complaining of right ankle pain and swelling.  She reports an inversion injury of the ankle secondary to a fall that occurred 5 days prior to arrival.  She reports swelling of the lateral ankle is persistent.  Pain worse with weight bearing.  She denies numbness, pain or swelling of the calf or open wounds.   Past Medical History:  Diagnosis Date  . Anxiety   . Asthma   . Diabetes mellitus without complication ([place])   . Hypertension   . Insomnia   . Obesity   . Substance abuse    cocaine, marijuana and alcohol abuse. quit end of 2012    Patient Active Problem List   Diagnosis Date Noted  . Acute CVA (cerebrovascular accident) ([place]) 08/06/2016  . Paresthesia 08/04/2016  . Hyponatremia 08/04/2016  . Essential hypertension 12/25/2014  . Insomnia 12/25/2014  . Asthma, chronic 12/25/2014  . Morbid obesity ([place]) 12/25/2014  . Bilateral ovarian cysts 12/16/2014  . Diabetes ([place]) 12/16/2014  . Morbid obesity with BMI of 50.0-59.9, adult ([place]) 05/20/2012  . Vaginal itching 05/20/2012    Past Surgical History:  Procedure Laterality Date  . CHOLECYSTECTOMY    . KNEE SURGERY    . TUBAL LIGATION      OB History    Gravida Para Term Preterm AB Living   5 3 3   2 3    SAB TAB Ectopic Multiple Live Births     2             Home Medications    Prior to Admission medications   Medication Sig Start Date End Date Taking? Authorizing Provider  amLODipine (NORVASC) 2.5 MG tablet Take 1 tablet (2.5 mg total) by mouth daily. 08/06/16  Yes Verlee Monte, MD  aspirin EC 325 MG EC tablet Take 1 tablet (325 mg total) by mouth daily. 08/06/16  Yes Verlee Monte, MD  atorvastatin (LIPITOR)  20 MG tablet Take 1 tablet (20 mg total) by mouth daily at 6 PM. 08/06/16  Yes Verlee Monte, MD  dapagliflozin propanediol (FARXIGA) 10 MG TABS tablet Take 10 mg by mouth daily.   Yes Historical Provider, MD  doxepin (SINEQUAN) 10 MG capsule Take 10 mg by mouth at bedtime.   Yes Historical Provider, MD  hydrOXYzine (VISTARIL) 25 MG capsule Take 25 mg by mouth daily as needed for anxiety or itching.    Yes Historical Provider, MD  metFORMIN (GLUCOPHAGE) 500 MG tablet Take 1 tablet (500 mg total) by mouth 2 (two) times daily with a meal. Patient taking differently: Take 1,000 mg by mouth 2 (two) times daily with a meal.  08/06/16  Yes Verlee Monte, MD  HYDROcodone-acetaminophen (NORCO/VICODIN) 5-325 MG tablet Take one tab po q 4-6 hrs prn pain 01/19/17   Kem Parkinson, PA-C    Family History Family History  Problem Relation Age of Onset  . Hypertension Mother   . Cancer Mother 59    colon  . Heart disease Father 66    heart failure  . Diabetes Father   . Heart failure Father   . Diabetes Maternal Grandmother   . CVA Maternal Grandmother 75  . Diabetes Paternal Grandmother   .  CVA Maternal Aunt 38    Aneurysm    Social History Social History  Substance Use Topics  . Smoking status: Former Smoker    Packs/day: 0.25    Years: 15.00    Types: Cigarettes    Quit date: 03/11/2012  . Smokeless tobacco: Never Used  . Alcohol use Yes     Comment: once a month     Allergies   Lisinopril   Review of Systems Review of Systems  Constitutional: Negative for chills and fever.  Musculoskeletal: Positive for arthralgias (right ankle pain) and joint swelling.  Skin: Negative for color change and wound.  All other systems reviewed and are negative.    Physical Exam Updated Vital Signs BP 103/82 (BP Location: Right Arm)   Pulse 73   Temp 98.6 F (37 C) (Oral)   Resp 16   Ht 5\' 4"  (1.626 m)   Wt 129.3 kg   LMP 01/12/2017   SpO2 98%   BMI 48.92 kg/m   Physical Exam    Constitutional: She is oriented to person, place, and time. She appears well-developed and well-nourished. No distress.  HENT:  Head: Normocephalic and atraumatic.  Cardiovascular: Normal rate, regular rhythm and intact distal pulses.   Pulmonary/Chest: Effort normal and breath sounds normal.  Musculoskeletal: She exhibits edema and tenderness. She exhibits no deformity.  ttp of lateral right ankle.   DP pulse is brisk,distal sensation intact.  No erythema, abrasion, bruising or bony deformity.  No proximal tenderness.  Neurological: She is alert and oriented to person, place, and time. She exhibits normal muscle tone. Coordination normal.  Skin: Skin is warm and dry. Capillary refill takes less than 2 seconds.  Nursing note and vitals reviewed.    ED Treatments / Results  Labs (all labs ordered are listed, but only abnormal results are displayed) Labs Reviewed - No data to display  EKG  EKG Interpretation None       Radiology Dg Ankle Complete Right  Result Date: 01/19/2017 CLINICAL DATA:  Ankle pain after slip and fall 5 days ago. EXAM: RIGHT ANKLE - COMPLETE 3+ VIEW COMPARISON:  None. FINDINGS: Tiny bony fragment projecting inferior to the lateral malleolus. The ankle mortise appears congruent and the tibiofibular syndesmosis intact. Subcentimeter focal lucency medial talar dome. Large plantar calcaneal spur. Lateral ankle soft tissue swelling without subcutaneous gas or radiopaque foreign bodies. IMPRESSION: Tiny acute lateral malleolus avulsion fracture. Suspected osteochondral defect talar dome would be better characterized with nonemergent MRI. Electronically Signed   By: Elon Alas M.D.   On: 01/19/2017 21:02    Procedures Procedures (including critical care time)  Medications Ordered in ED Medications - No data to display   Initial Impression / Assessment and Plan / ED Course  I have reviewed the triage vital signs and the nursing notes.  Pertinent labs &  imaging results that were available during my care of the patient were reviewed by me and considered in my medical decision making (see chart for details).     SPLINT APPLICATION Date/Time: 9:76 PM Authorized by: Hale Bogus. Consent: Verbal consent obtained. Risks and benefits: risks, benefits and alternatives were discussed Consent given by: patient Splint applied by: nursing Location details: right ankle Splint type: velcro and lacing orthotic Supplies used: ASO brace Post-procedure: The splinted body part was neurovascularly unchanged following the procedure. Patient tolerance: Patient tolerated the procedure well with no immediate complications.  Bony lucency seen on plain film discussed with patient and importance of orthopedic f/u.  Pt is non-tender to that area. Pt agrees to RICE therapy.    Final Clinical Impressions(s) / ED Diagnoses   Final diagnoses:  Closed nondisplaced fracture of lateral malleolus of right fibula, initial encounter    New Prescriptions Discharge Medication List as of 01/19/2017  9:35 PM    START taking these medications   Details  HYDROcodone-acetaminophen (NORCO/VICODIN) 5-325 MG tablet Take one tab po q 4-6 hrs prn pain, Print         Kem Parkinson, PA-C 01/21/17 [place], MD 01/24/17 1318

## 2017-01-26 ENCOUNTER — Encounter: Payer: Self-pay | Admitting: Orthopaedic Surgery

## 2017-01-26 ENCOUNTER — Ambulatory Visit (INDEPENDENT_AMBULATORY_CARE_PROVIDER_SITE_OTHER): Payer: BLUE CROSS/BLUE SHIELD | Admitting: Orthopaedic Surgery

## 2017-01-26 VITALS — BP 137/90 | HR 71 | Temp 98.1°F | Ht 63.5 in | Wt 296.0 lb

## 2017-01-26 DIAGNOSIS — M958 Other specified acquired deformities of musculoskeletal system: Secondary | ICD-10-CM | POA: Diagnosis not present

## 2017-01-26 DIAGNOSIS — S8261XA Displaced fracture of lateral malleolus of right fibula, initial encounter for closed fracture: Secondary | ICD-10-CM | POA: Diagnosis not present

## 2017-01-26 NOTE — Progress Notes (Signed)
Subjective:    Patient ID: Joanna Newman, female    DOB: 1971/02/18, 46 y.o.   MRN: [id number]  HPI She hurt her right ankle while horse playing with her husband on Easter Sunday January 09, 2017.  Her right ankle laterally has been hurting and swelling.  She used ice and elevation but it still hurt. She went to the ER on 01-19-17.  X-rays showed: IMPRESSION: Tiny acute lateral malleolus avulsion fracture.  Suspected osteochondral defect talar dome would be better characterized with nonemergent MRI.  She was given ankle brace.  She was told to come here.  Her ankle is better but has lateral swelling.  She is using a cane and walking on it.  She was told by me of the findings of the x-rays and the need for a MRI.  I showed her the x-rays and explained the small medial defect.  I agree a MRI is needed.  This is not hurting her now but it could become a problem.  I will request approval by insurance company for the MRI.  She is to continue the brace.  I have given instruction sheet for [employer].   Review of Systems  HENT: Negative for congestion.   Respiratory: Positive for shortness of breath. Negative for cough.   Cardiovascular: Negative for chest pain and leg swelling.  Endocrine: Negative for cold intolerance.  Musculoskeletal: Positive for arthralgias, gait problem and joint swelling.  Allergic/Immunologic: Negative for environmental allergies.  Psychiatric/Behavioral: The patient is nervous/anxious.    Past Medical History:  Diagnosis Date  . Anxiety   . Asthma   . Diabetes mellitus without complication ([place])   . Hypertension   . Insomnia   . Obesity   . Substance abuse    cocaine, marijuana and alcohol abuse. quit end of 2012    Past Surgical History:  Procedure Laterality Date  . CHOLECYSTECTOMY    . KNEE SURGERY    . TUBAL LIGATION      Current Outpatient Prescriptions on File Prior to Visit  Medication Sig Dispense Refill  . amLODipine (NORVASC) 2.5  MG tablet Take 1 tablet (2.5 mg total) by mouth daily. 30 tablet 0  . aspirin EC 325 MG EC tablet Take 1 tablet (325 mg total) by mouth daily.    [name] atorvastatin (LIPITOR) 20 MG tablet Take 1 tablet (20 mg total) by mouth daily at 6 PM. 30 tablet 0  . dapagliflozin propanediol (FARXIGA) 10 MG TABS tablet Take 10 mg by mouth daily.    [name] doxepin (SINEQUAN) 10 MG capsule Take 10 mg by mouth at bedtime.    [name] HYDROcodone-acetaminophen (NORCO/VICODIN) 5-325 MG tablet Take one tab po q 4-6 hrs prn pain 12 tablet 0  . hydrOXYzine (VISTARIL) 25 MG capsule Take 25 mg by mouth daily as needed for anxiety or itching.     . metFORMIN (GLUCOPHAGE) 500 MG tablet Take 1 tablet (500 mg total) by mouth 2 (two) times daily with a meal. (Patient taking differently: Take 1,000 mg by mouth 2 (two) times daily with a meal. ) 60 tablet 2   No current facility-administered medications on file prior to visit.     Social History   Social History  . Marital status: Married    Spouse name: N/A  . Number of children: N/A  . Years of education: N/A   Occupational History  . event specialist    Social History Main Topics  . Smoking status: Former Smoker    Packs/day: 0.25  Years: 15.00    Types: Cigarettes    Quit date: 03/11/2012  . Smokeless tobacco: Never Used  . Alcohol use Yes     Comment: once a month  . Drug use: Yes    Types: Marijuana     Comment:  history of substance abuse; last marijuana use about 1 week ago  . Sexual activity: Not Currently   Other Topics Concern  . 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 Hypertension Mother   . Cancer Mother 39    colon  . Heart disease Father 53    heart failure  . Diabetes Father   . Heart failure Father   . Diabetes Maternal Grandmother   . CVA Maternal Grandmother 52  . Diabetes Paternal Grandmother   . CVA Maternal Aunt 38     Aneurysm    BP 137/90   Pulse 71   Temp 98.1 F (36.7 C)   Ht 5' 3.5" (1.613 m)   Wt 296 lb (134.3 kg)   LMP 01/12/2017   BMI 51.61 kg/m      Objective:   Physical Exam  Constitutional: She is oriented to person, place, and time. She appears well-developed and well-nourished.  HENT:  Head: Normocephalic and atraumatic.  Eyes: Conjunctivae and EOM are normal. Pupils are equal, round, and reactive to light.  Neck: Normal range of motion. Neck supple.  Cardiovascular: Normal rate, regular rhythm and intact distal pulses.   Pulmonary/Chest: Effort normal.  Abdominal: Soft.  Musculoskeletal: She exhibits tenderness (Right ankle tender, lateral swelling, ROM is full, NV intact.  Uses cane.  Limp to the right.).  Neurological: She is alert and oriented to person, place, and time. She displays normal reflexes. No cranial nerve deficit. She exhibits normal muscle tone. Coordination normal.  Skin: Skin is warm and dry.  Psychiatric: She has a normal mood and affect. Her behavior is normal. Judgment and thought content normal.  Vitals reviewed.         Assessment & Plan:   Encounter Diagnoses  Name Primary?  . Osteochondral defect of talus Yes  . Closed fracture of distal lateral malleolus of right fibula, initial encounter    Get MRI of the talus.  Return after MRI.  Call if any problem.  Electronically Signed Sanjuana Kava, MD 4/18/20189:30 AM

## 2017-01-26 NOTE — Patient Instructions (Signed)
May work but needs to sit down periodically. Expect a call from [facility] to schedule your MRI.

## 2017-02-13 ENCOUNTER — Ambulatory Visit
Admission: RE | Admit: 2017-02-13 | Discharge: 2017-02-13 | Disposition: A | Discharge status: Home / Self Care | Payer: BLUE CROSS/BLUE SHIELD | Source: Ambulatory Visit | Attending: Orthopaedic Surgery | Admitting: Orthopaedic Surgery

## 2017-02-13 DIAGNOSIS — M25471 Effusion, right ankle: Secondary | ICD-10-CM | POA: Diagnosis not present

## 2017-02-13 DIAGNOSIS — M958 Other specified acquired deformities of musculoskeletal system: Secondary | ICD-10-CM

## 2017-02-15 DIAGNOSIS — I10 Essential (primary) hypertension: Secondary | ICD-10-CM | POA: Diagnosis not present

## 2017-02-15 DIAGNOSIS — I639 Cerebral infarction, unspecified: Secondary | ICD-10-CM | POA: Diagnosis not present

## 2017-02-15 DIAGNOSIS — E119 Type 2 diabetes mellitus without complications: Secondary | ICD-10-CM | POA: Diagnosis not present

## 2017-02-22 ENCOUNTER — Ambulatory Visit (INDEPENDENT_AMBULATORY_CARE_PROVIDER_SITE_OTHER): Payer: Self-pay | Admitting: Orthopaedic Surgery

## 2017-02-22 VITALS — BP 150/105 | HR 81 | Ht 63.5 in | Wt 290.0 lb

## 2017-02-22 DIAGNOSIS — M958 Other specified acquired deformities of musculoskeletal system: Secondary | ICD-10-CM

## 2017-02-22 DIAGNOSIS — S93491A Sprain of other ligament of right ankle, initial encounter: Secondary | ICD-10-CM

## 2017-02-22 NOTE — Progress Notes (Signed)
Patient CW:[id number] Newman, female DOB:07-05-1971, 46 y.o. WTU:[id number]  Chief Complaint  Patient presents with  . Results    MRI Right Ankle    HPI  Joanna Newman is a 46 y.o. female who has had ankle pain on the right for some time.  She had fracture of the lateral malleolus but did not get better.  She had MRI of the ankle done 02-13-17 showing: IMPRESSION: 1. Probable subacute tear of the anterior talofibular ligament versus synovitis. This may contribute to anterolateral impingement. 2. Small osteochondral lesion of the talar dome medially appears chronic. Small ankle joint effusion with synovitis. No discrete loose bodies seen. 3. No acute osseous findings. 4. The ankle tendons appear normal.  I have explained the findings to her and used a model of the ankle.  I would like her to see Dr. Sharol Given in [place] and see if she is a candidate for possible surgery or just following this problem.  HPI  Body mass index is 50.57 kg/m.  ROS  Review of Systems  HENT: Negative for congestion.   Respiratory: Positive for shortness of breath. Negative for cough.   Cardiovascular: Negative for chest pain and leg swelling.  Endocrine: Negative for cold intolerance.  Musculoskeletal: Positive for arthralgias, gait problem and joint swelling.  Allergic/Immunologic: Negative for environmental allergies.  Psychiatric/Behavioral: The patient is nervous/anxious.     Past Medical History:  Diagnosis Date  . Anxiety   . Asthma   . Diabetes mellitus without complication ([facility])   . Hypertension   . Insomnia   . Obesity   . Substance abuse    cocaine, marijuana and alcohol abuse. quit end of 2012    Past Surgical History:  Procedure Laterality Date  . CHOLECYSTECTOMY    . KNEE SURGERY    . TUBAL LIGATION      Family History  Problem Relation Age of Onset  . Hypertension Mother   . Cancer Mother 85       colon  . Heart disease Father 82       heart failure  . Diabetes  Father   . Heart failure Father   . Diabetes Maternal Grandmother   . CVA Maternal Grandmother 109  . Diabetes Paternal Grandmother   . CVA Maternal Aunt 38       Aneurysm    Social History Social History  Substance Use Topics  . Smoking status: Former Smoker    Packs/day: 0.25    Years: 15.00    Types: Cigarettes    Quit date: 03/11/2012  . Smokeless tobacco: Never Used  . Alcohol use Yes     Comment: once a month    Allergies  Allergen Reactions  . Lisinopril Swelling    Current Outpatient Prescriptions  Medication Sig Dispense Refill  . amLODipine (NORVASC) 2.5 MG tablet Take 1 tablet (2.5 mg total) by mouth daily. 30 tablet 0  . aspirin EC 325 MG EC tablet Take 1 tablet (325 mg total) by mouth daily.    [name] atorvastatin (LIPITOR) 20 MG tablet Take 1 tablet (20 mg total) by mouth daily at 6 PM. 30 tablet 0  . dapagliflozin propanediol (FARXIGA) 10 MG TABS tablet Take 10 mg by mouth daily.    [name] doxepin (SINEQUAN) 10 MG capsule Take 10 mg by mouth at bedtime.    [name] HYDROcodone-acetaminophen (NORCO/VICODIN) 5-325 MG tablet Take one tab po q 4-6 hrs prn pain 12 tablet 0  . hydrOXYzine (VISTARIL) 25 MG capsule Take 25 mg by mouth  daily as needed for anxiety or itching.     . metFORMIN (GLUCOPHAGE) 500 MG tablet Take 1 tablet (500 mg total) by mouth 2 (two) times daily with a meal. (Patient taking differently: Take 1,000 mg by mouth 2 (two) times daily with a meal. ) 60 tablet 2   No current facility-administered medications for this visit.      Physical Exam  Blood pressure (!) 150/105, pulse 81, height 5' 3.5" (1.613 m), weight 290 lb (131.5 kg).  Constitutional: overall normal hygiene, normal nutrition, well developed, normal grooming, normal body habitus. Assistive device:ankle brace lace up  Musculoskeletal: gait and station Limp right, muscle tone and strength are normal, no tremors or atrophy is present.  .  Neurological: coordination overall normal.  Deep tendon  reflex/nerve stretch intact.  Sensation normal.  Cranial nerves II-XII intact.   Skin:   Normal overall no scars, lesions, ulcers or rashes. No psoriasis.  Psychiatric: Alert and oriented x 3.  Recent memory intact, remote memory unclear.  Normal mood and affect. Well groomed.  Good eye contact.  Cardiovascular: overall no swelling, no varicosities, no edema bilaterally, normal temperatures of the legs and arms, no clubbing, cyanosis and good capillary refill.  Lymphatic: palpation is normal.  Right ankle has swelling of the lateral side of the ankle with tenderness of the anterior talofibular ligament.  ROM is full.  She limps to the right.  NV intact.  She has no medial pain.  The patient has been educated about the nature of the problem(s) and counseled on treatment options.  The patient appeared to understand what I have discussed and is in agreement with it.  Encounter Diagnoses  Name Primary?  . Tear of talofibular ligament of right lower extremity, initial encounter Yes  . Osteochondral defect of talus     PLAN Call if any problems.  Precautions discussed.  Continue current medications.   Return to clinic to see Dr. Sharol Given    Electronically Signed Joanna Kava, MD 5/15/20183:38 PM

## 2017-03-10 ENCOUNTER — Ambulatory Visit (INDEPENDENT_AMBULATORY_CARE_PROVIDER_SITE_OTHER): Payer: Self-pay | Admitting: Orthopedic Surgery

## 2017-03-21 ENCOUNTER — Other Ambulatory Visit: Payer: Self-pay

## 2017-03-28 LAB — CYTOLOGY - PAP: DIAGNOSIS: NEGATIVE

## 2017-04-05 ENCOUNTER — Encounter (INDEPENDENT_AMBULATORY_CARE_PROVIDER_SITE_OTHER): Payer: Self-pay | Admitting: Orthopedic Surgery

## 2017-04-05 ENCOUNTER — Ambulatory Visit (INDEPENDENT_AMBULATORY_CARE_PROVIDER_SITE_OTHER): Payer: BLUE CROSS/BLUE SHIELD | Admitting: Orthopedic Surgery

## 2017-04-05 VITALS — Ht 63.5 in | Wt 290.0 lb

## 2017-04-05 DIAGNOSIS — M25871 Other specified joint disorders, right ankle and foot: Secondary | ICD-10-CM | POA: Insufficient documentation

## 2017-04-05 MED ORDER — LIDOCAINE HCL 1 % IJ SOLN
2.0000 mL | INTRAMUSCULAR | Status: AC | PRN
  Administered 2017-04-05: 2 mL

## 2017-04-05 MED ORDER — METHYLPREDNISOLONE ACETATE 40 MG/ML IJ SUSP
40.0000 mg | INTRAMUSCULAR | Status: AC | PRN
  Administered 2017-04-05: 40 mg via INTRA_ARTICULAR

## 2017-04-05 NOTE — Progress Notes (Signed)
Office Visit Note   Patient: Joanna Newman           Date of Birth: [birth year]           MRN: [id number] Visit Date: 04/05/2017              Requested by: Sanjuana Kava, [address] [place], [place] [address] PCP: Alroy Dust, L.Marlou Sa, MD  Chief Complaint  Patient presents with  . Right Ankle - Pain      HPI: Patient is a 46 year old woman who states he's been having right ankle pain since April. She injured it in the fall she has had an MRI scan she states that her ankle is not getting better she is still using an ankle stabilizing orthosis. She is status post Achilles tendon surgery in 2007.  Assessment & Plan: Visit Diagnoses:  1. Impingement of right ankle joint     Plan: Ankle was injected follow-up in 4 weeks. Patient's ankle is stable but she is having impingement symptoms she may benefit from arthroscopic debridement depending on the results of the injection.  Follow-Up Instructions: Return in about 4 weeks (around 05/03/2017).   Ortho Exam  Patient is alert, oriented, no adenopathy, well-dressed, normal affect, normal respiratory effort. On examination patient has a good dorsalis pedis pulse. She is tender to palpation of the anterior joint line. Anterior drawer is stable she does not have instability of her anterior talofibular ligament. She does have some tenderness to palpation over the peroneal tendons the posterior tibial tendon is nontender to palpation. Review of the MRI scan does show a tear of the anterior talofibular ligament with some osteochondral changes of the talar dome. There are no acute bony fractures the ankle tendons are intact with no evidence of tethering by the MRI scan  Imaging: No results found.  Labs: Lab Results  Component Value Date   HGBA1C >15.5 (H) 08/04/2016   HGBA1C 14.1 (H) 12/16/2014   HGBA1C 14.3 (H) 11/08/2014    Orders:  No orders of the defined types were placed in this encounter.  No orders of the defined  types were placed in this encounter.    Procedures: Medium Joint Inj Date/Time: 04/05/2017 2:30 PM Performed by: Tiffancy Moger V Authorized by: Newt Minion   Consent Given by:  Patient Site marked: the procedure site was marked   Timeout: prior to procedure the correct patient, procedure, and site was verified   Indications:  Pain and diagnostic evaluation Location:  Ankle Site:  R ankle Prep: patient was prepped and draped in usual sterile fashion   Needle Size:  22 G Needle Length:  1.5 inches Approach:  Anteromedial Ultrasound Guided: No   Fluoroscopic Guidance: No   Medications:  2 mL lidocaine 1 %; 40 mg methylPREDNISolone acetate 40 MG/ML Aspiration Attempted: No   Patient tolerance:  Patient tolerated the procedure well with no immediate complications    Clinical Data: No additional findings.  ROS:  All other systems negative, except as noted in the HPI. Review of Systems  Objective: Vital Signs: Ht 5' 3.5" (1.613 m)   Wt 290 lb (131.5 kg)   BMI 50.57 kg/m   Specialty Comments:  No specialty comments available.  PMFS History: Patient Active Problem List   Diagnosis Date Noted  . Impingement of right ankle joint 04/05/2017  . Acute CVA (cerebrovascular accident) ([facility]) 08/06/2016  . Paresthesia 08/04/2016  . Hyponatremia 08/04/2016  . Essential hypertension 12/25/2014  . Insomnia 12/25/2014  . Asthma,  chronic 12/25/2014  . Morbid obesity ([facility]) 12/25/2014  . Bilateral ovarian cysts 12/16/2014  . Diabetes ([facility]) 12/16/2014  . Morbid obesity with BMI of 50.0-59.9, adult ([facility]) 05/20/2012  . Vaginal itching 05/20/2012   Past Medical History:  Diagnosis Date  . Anxiety   . Asthma   . Diabetes mellitus without complication ([facility])   . Hypertension   . Insomnia   . Obesity   . Substance abuse    cocaine, marijuana and alcohol abuse. quit end of 2012    Family History  Problem Relation Age of Onset  . Hypertension Mother   . Cancer Mother 78        colon  . Heart disease Father 58       heart failure  . Diabetes Father   . Heart failure Father   . Diabetes Maternal Grandmother   . CVA Maternal Grandmother 64  . Diabetes Paternal Grandmother   . CVA Maternal Aunt 38       Aneurysm    Past Surgical History:  Procedure Laterality Date  . CHOLECYSTECTOMY    . KNEE SURGERY    . TUBAL LIGATION     Social History   Occupational History  . event specialist    Social History Main Topics  . Smoking status: Former Smoker    Packs/day: 0.25    Years: 15.00    Types: Cigarettes    Quit date: 03/11/2012  . Smokeless tobacco: Never Used  . Alcohol use Yes     Comment: once a month  . Drug use: Yes    Types: Marijuana     Comment:  history of substance abuse; last marijuana use about 1 week ago  . Sexual activity: Not Currently

## 2017-04-06 ENCOUNTER — Telehealth (INDEPENDENT_AMBULATORY_CARE_PROVIDER_SITE_OTHER): Payer: Self-pay | Admitting: Orthopedic Surgery

## 2017-04-06 NOTE — Telephone Encounter (Signed)
Pt was in the office yesterday given ankle injection and states that she is in worse pain that she was before.

## 2017-04-06 NOTE — Telephone Encounter (Signed)
If the injection made her symptoms worse than or only other option would be arthroscopic surgery for debridement of the ankle joint. This may relieve 75% of her symptoms. We could set surgery up as an outpatient at the surgical center.

## 2017-04-06 NOTE — Telephone Encounter (Signed)
Patient called and said that after her injection she received yesterday in her ankle, she's in more pain than she was in before. She's been taking ibuprofen but it hasn't touched the pain. CB # [phone]

## 2017-04-06 NOTE — Telephone Encounter (Signed)
I called and advised pt of message below. She states that she has been using ice and elevation and advised that she could instead try 2 aleve bid. She will call on Friday with an update and to discuss if she wants to proceed with surgery.

## 2017-04-09 IMAGING — US US CAROTID DUPLEX BILAT
1 series · 14 of 24 positions shown · non-contrast
Comparison: None.

CLINICAL DATA: TIA.  Left facial numbness for 1 day.

EXAM:
BILATERAL CAROTID DUPLEX ULTRASOUND
TECHNIQUE: Gray scale imaging, color Doppler and duplex ultrasound were
performed of bilateral carotid and vertebral arteries in the neck.

[Series 1: us carotid duplex bilat · 0.06mm/px · 14 of 68 slices shown]
[im 1/68]
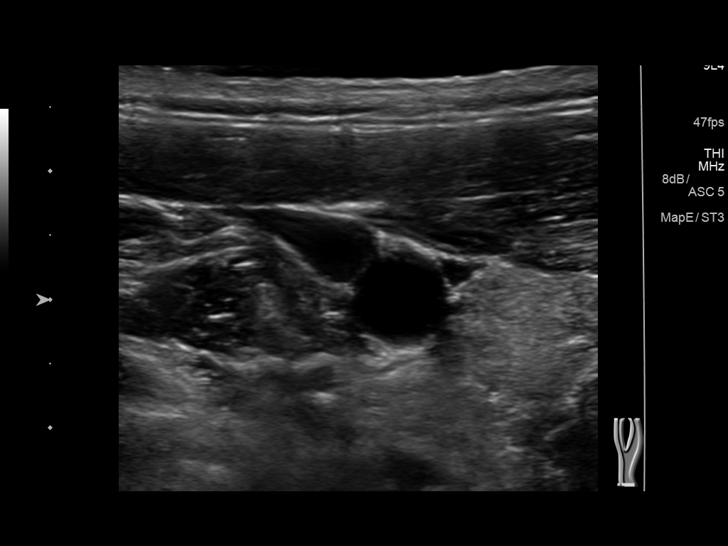
[im 6/68]
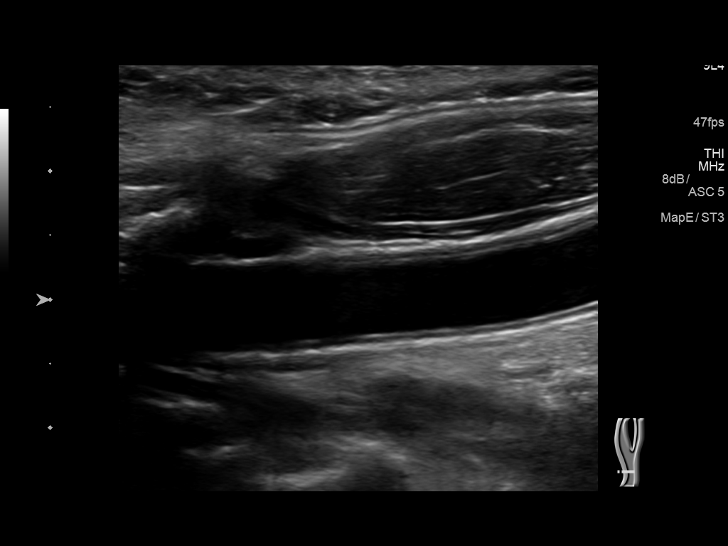
[im 12/68]
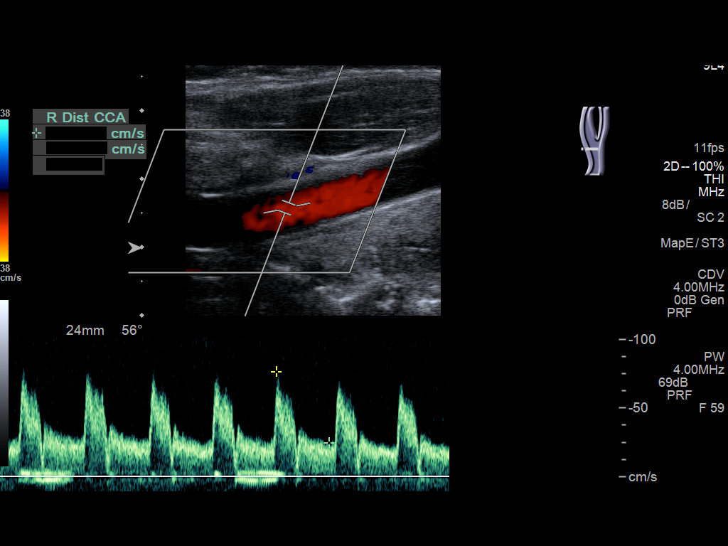
[im 18/68]
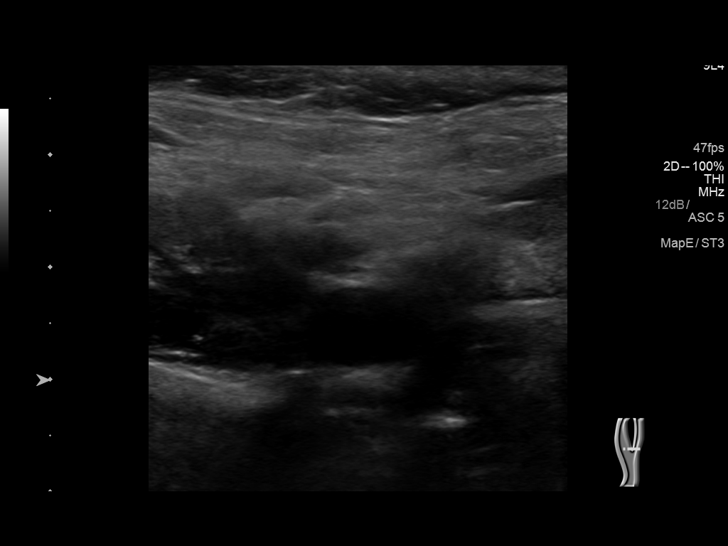
[im 21/68]
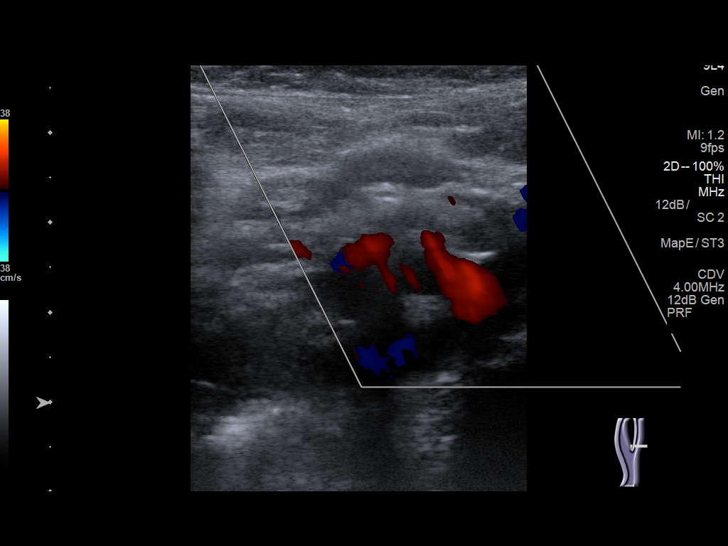
[im 27/68]
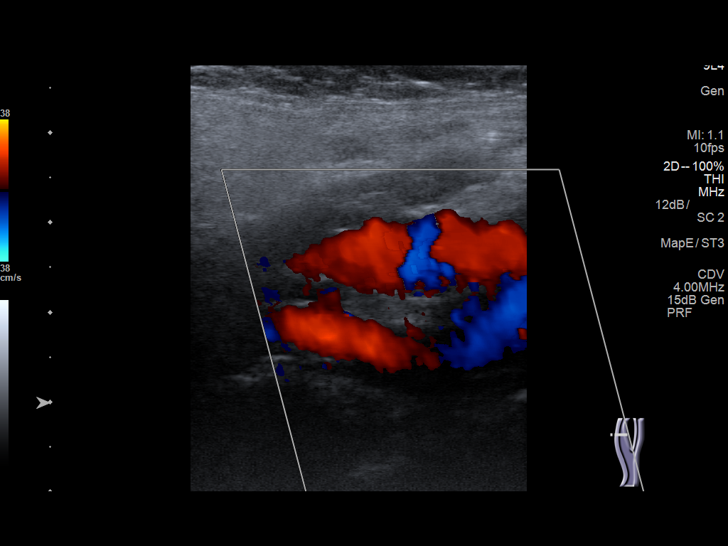
[im 33/68]
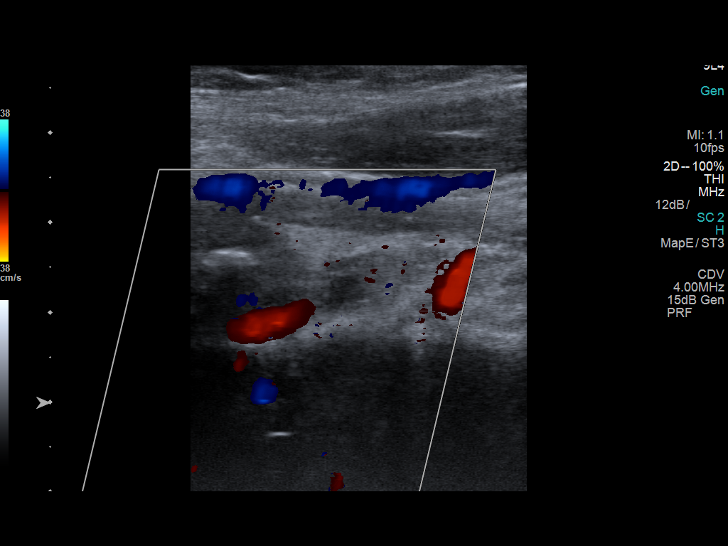
[im 35/68]
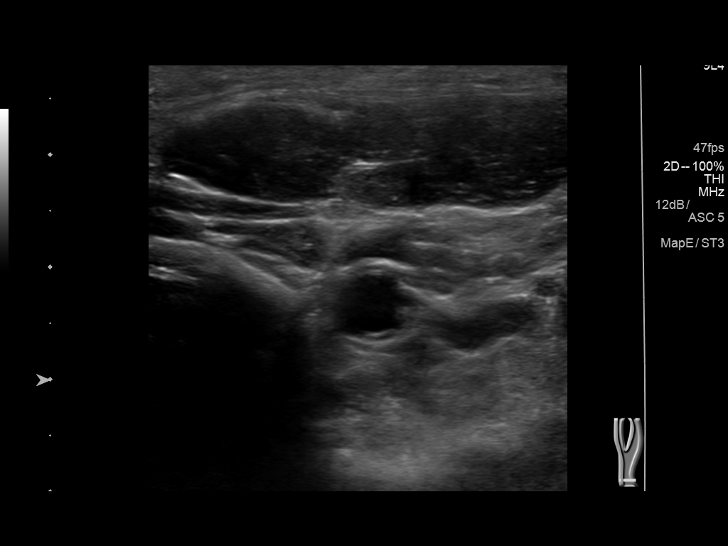
[im 41/68]
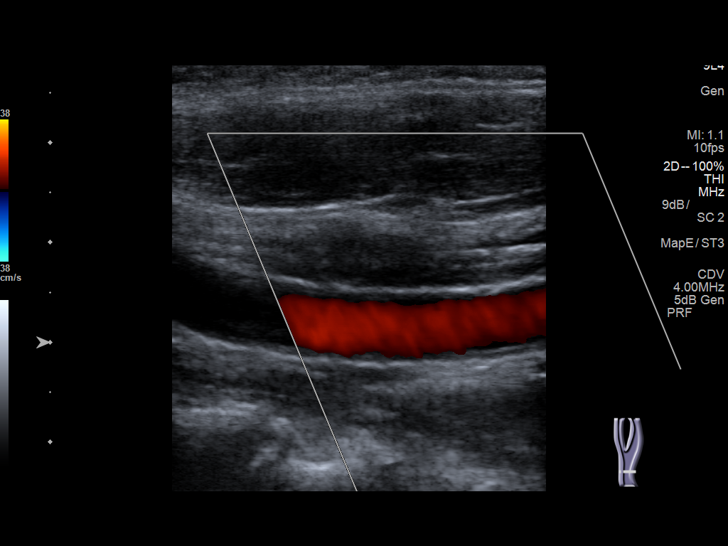
[im 47/68]
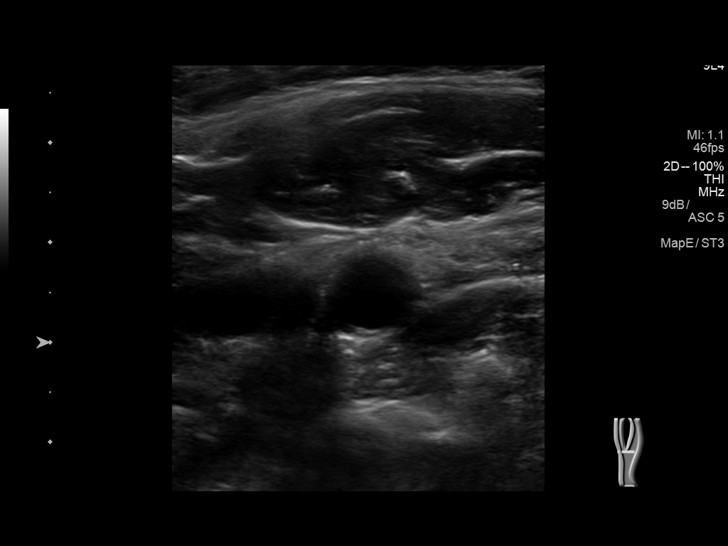
[im 53/68]
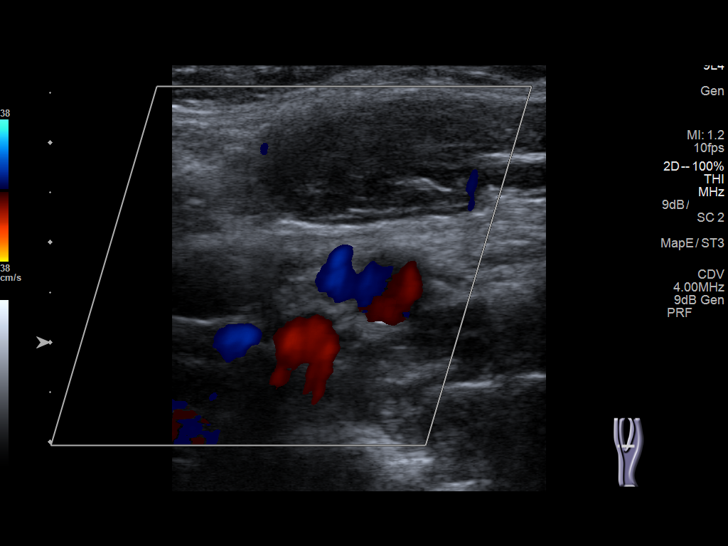
[im 56/68]
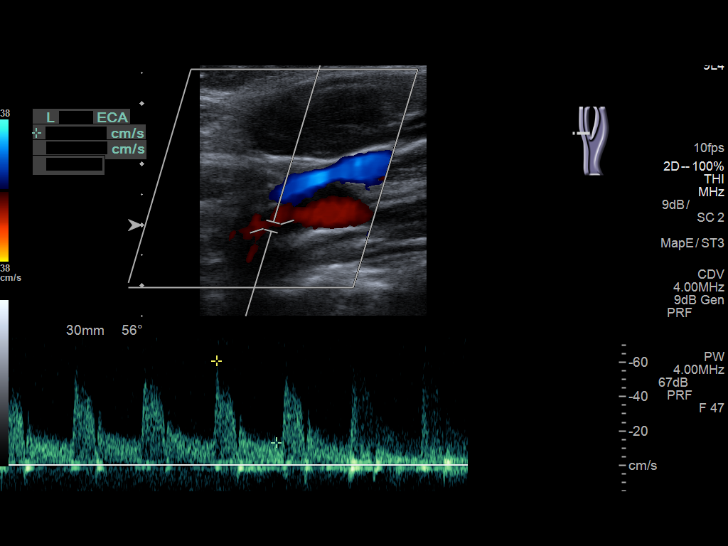
[im 62/68]
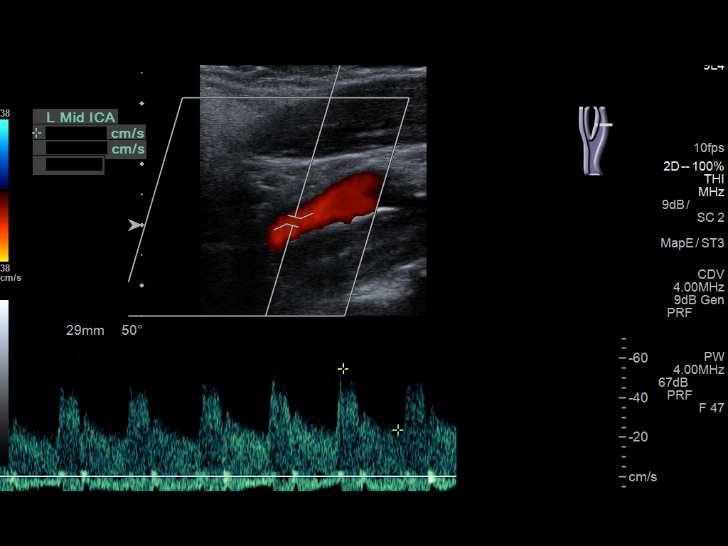
[im 68/68]
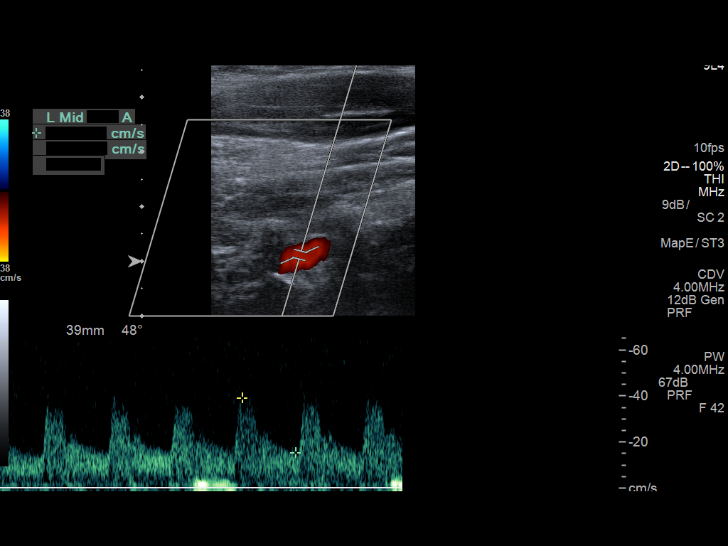

[14 of 24 positions shown; findings below may reference images not displayed]

FINDINGS: Criteria: Quantification of carotid stenosis is based on velocity
parameters that correlate the residual internal carotid diameter
with NASCET-based stenosis levels, using the diameter of the distal
internal carotid lumen as the denominator for stenosis measurement.

The following velocity measurements were obtained:

RIGHT

ICA:  83 cm/sec

CCA:  87 cm/sec

SYSTOLIC ICA/CCA RATIO:  1

DIASTOLIC ICA/CCA RATIO:

ECA:  45 cm/sec

LEFT

ICA:  71 cm/sec

CCA:  59 cm/sec

SYSTOLIC ICA/CCA RATIO:

DIASTOLIC ICA/CCA RATIO:

ECA:  61 cm/sec

RIGHT CAROTID ARTERY: Mild soft plaque in the bulb. Low resistance
internal carotid Doppler pattern.

RIGHT VERTEBRAL ARTERY:  Antegrade.

LEFT CAROTID ARTERY: Minimal plaque in the bulb. Low resistance
internal carotid Doppler pattern.

LEFT VERTEBRAL ARTERY:  Antegrade.
IMPRESSION: Less than 50% stenosis in the right and left internal carotid
arteries.

## 2017-05-03 ENCOUNTER — Encounter (INDEPENDENT_AMBULATORY_CARE_PROVIDER_SITE_OTHER): Payer: Self-pay | Admitting: Orthopedic Surgery

## 2017-05-03 ENCOUNTER — Ambulatory Visit (INDEPENDENT_AMBULATORY_CARE_PROVIDER_SITE_OTHER): Payer: BLUE CROSS/BLUE SHIELD | Admitting: Orthopedic Surgery

## 2017-05-03 ENCOUNTER — Telehealth (INDEPENDENT_AMBULATORY_CARE_PROVIDER_SITE_OTHER): Payer: Self-pay | Admitting: Orthopedic Surgery

## 2017-05-03 VITALS — Ht 63.0 in | Wt 290.0 lb

## 2017-05-03 DIAGNOSIS — M25871 Other specified joint disorders, right ankle and foot: Secondary | ICD-10-CM

## 2017-05-03 DIAGNOSIS — M25571 Pain in right ankle and joints of right foot: Secondary | ICD-10-CM | POA: Diagnosis not present

## 2017-05-03 MED ORDER — LIDOCAINE HCL 1 % IJ SOLN
2.0000 mL | INTRAMUSCULAR | Status: AC | PRN
  Administered 2017-05-03: 2 mL

## 2017-05-03 MED ORDER — METHYLPREDNISOLONE ACETATE 40 MG/ML IJ SUSP
40.0000 mg | INTRAMUSCULAR | Status: AC | PRN
  Administered 2017-05-03: 40 mg via INTRA_ARTICULAR

## 2017-05-03 NOTE — Progress Notes (Signed)
Office Visit Note   Patient: Joanna Newman           Date of Birth: 1971-02-22           MRN: [id number] Visit Date: 05/03/2017              Requested by: Alroy Dust, L.Marlou Sa, [address] [place] [address] [employer] [address] [place], [place] [address] PCP: Alroy Dust, L.Marlou Sa, MD  Chief Complaint  Patient presents with  . Right Ankle - Follow-up    S/p injection 04/05/17      HPI: Patient states that after last injection of her right ankle she went back to work she had about a weeks worth of pain but after that the medication seemed to start working in her ankle pain resolved. She states she started to have some increasing pain at this time. She is pleased with the response from the first injection.  Assessment & Plan: Visit Diagnoses:  1. Impingement of right ankle joint     Plan: Ankle was injected discussed that we could repeat a third injection if necessary otherwise would recommend proceeding with arthroscopic debridement for the impingement. Recommended ice this evening after the injection recommended staying off her foot tonight and not returning to work.  Follow-Up Instructions: Return if symptoms worsen or fail to improve.   Ortho Exam  Patient is alert, oriented, no adenopathy, well-dressed, normal affect, normal respiratory effort. Patient has a normal gait. The MRI scan was reviewed which shows an old small osteochondral lesion with some impingement laterally. No acute findings. Patient has a good dorsalis pedis pulse she is tender to palpation anteriorly over the ankle there is no crepitation with range of motion no focal motor weakness.  Imaging: No results found.  Labs: Lab Results  Component Value Date   HGBA1C >15.5 (H) 08/04/2016   HGBA1C 14.1 (H) 12/16/2014   HGBA1C 14.3 (H) 11/08/2014    Orders:  No orders of the defined types were placed in this encounter.  No orders of the defined types were placed in this encounter.    Procedures: Medium Joint Inj Date/Time:  05/03/2017 2:10 PM Performed by: Liset Mcmonigle V Authorized by: Newt Minion   Consent Given by:  Patient Site marked: the procedure site was marked   Timeout: prior to procedure the correct patient, procedure, and site was verified   Indications:  Pain and diagnostic evaluation Location:  Ankle Site:  R ankle Prep: patient was prepped and draped in usual sterile fashion   Needle Size:  22 G Needle Length:  1.5 inches Approach:  Anteromedial Ultrasound Guided: No   Fluoroscopic Guidance: No   Medications:  2 mL lidocaine 1 %; 40 mg methylPREDNISolone acetate 40 MG/ML Aspiration Attempted: No   Patient tolerance:  Patient tolerated the procedure well with no immediate complications    Clinical Data: No additional findings.  ROS:  All other systems negative, except as noted in the HPI. Review of Systems  Objective: Vital Signs: Ht 5\' 3"  (1.6 m)   Wt 290 lb (131.5 kg)   BMI 51.37 kg/m   Specialty Comments:  No specialty comments available.  PMFS History: Patient Active Problem List   Diagnosis Date Noted  . Impingement of right ankle joint 04/05/2017  . Acute CVA (cerebrovascular accident) ([facility]) 08/06/2016  . Paresthesia 08/04/2016  . Hyponatremia 08/04/2016  . Essential hypertension 12/25/2014  . Insomnia 12/25/2014  . Asthma, chronic 12/25/2014  . Morbid obesity ([facility]) 12/25/2014  . Bilateral ovarian cysts 12/16/2014  .  Diabetes ([facility]) 12/16/2014  . Morbid obesity with BMI of 50.0-59.9, adult ([facility]) 05/20/2012  . Vaginal itching 05/20/2012   Past Medical History:  Diagnosis Date  . Anxiety   . Asthma   . Diabetes mellitus without complication ([facility])   . Hypertension   . Insomnia   . Obesity   . Substance abuse    cocaine, marijuana and alcohol abuse. quit end of 2012    Family History  Problem Relation Age of Onset  . Hypertension Mother   . Cancer Mother 28       colon  . Heart disease Father 73       heart failure  . Diabetes Father   . Heart  failure Father   . Diabetes Maternal Grandmother   . CVA Maternal Grandmother 74  . Diabetes Paternal Grandmother   . CVA Maternal Aunt 38       Aneurysm    Past Surgical History:  Procedure Laterality Date  . CHOLECYSTECTOMY    . KNEE SURGERY    . TUBAL LIGATION     Social History   Occupational History  . event specialist    Social History Main Topics  . Smoking status: Former Smoker    Packs/day: 0.25    Years: 15.00    Types: Cigarettes    Quit date: 03/11/2012  . Smokeless tobacco: Never Used  . Alcohol use Yes     Comment: once a month  . Drug use: Yes    Types: Marijuana     Comment:  history of substance abuse; last marijuana use about 1 week ago  . Sexual activity: Not Currently

## 2017-05-03 NOTE — Telephone Encounter (Signed)
Returned call to patient left message to call back   [phone]

## 2017-06-01 DIAGNOSIS — M545 Low back pain: Secondary | ICD-10-CM | POA: Diagnosis not present

## 2017-06-02 ENCOUNTER — Other Ambulatory Visit: Payer: Self-pay | Admitting: Family Medicine

## 2017-06-02 ENCOUNTER — Ambulatory Visit
Admission: RE | Admit: 2017-06-02 | Discharge: 2017-06-02 | Disposition: A | Discharge status: Home / Self Care | Payer: BLUE CROSS/BLUE SHIELD | Source: Ambulatory Visit | Attending: Family Medicine | Admitting: Family Medicine

## 2017-06-02 DIAGNOSIS — M5489 Other dorsalgia: Secondary | ICD-10-CM

## 2017-06-02 DIAGNOSIS — M47816 Spondylosis without myelopathy or radiculopathy, lumbar region: Secondary | ICD-10-CM | POA: Diagnosis not present

## 2017-06-21 DIAGNOSIS — I10 Essential (primary) hypertension: Secondary | ICD-10-CM | POA: Diagnosis not present

## 2017-06-21 DIAGNOSIS — D252 Subserosal leiomyoma of uterus: Secondary | ICD-10-CM | POA: Diagnosis not present

## 2017-06-21 DIAGNOSIS — N939 Abnormal uterine and vaginal bleeding, unspecified: Secondary | ICD-10-CM | POA: Diagnosis not present

## 2017-06-28 DIAGNOSIS — E1165 Type 2 diabetes mellitus with hyperglycemia: Secondary | ICD-10-CM | POA: Diagnosis not present

## 2017-06-28 DIAGNOSIS — I10 Essential (primary) hypertension: Secondary | ICD-10-CM | POA: Diagnosis not present

## 2017-06-28 DIAGNOSIS — Z7984 Long term (current) use of oral hypoglycemic drugs: Secondary | ICD-10-CM | POA: Diagnosis not present

## 2017-06-28 DIAGNOSIS — N939 Abnormal uterine and vaginal bleeding, unspecified: Secondary | ICD-10-CM | POA: Diagnosis not present

## 2017-12-31 ENCOUNTER — Other Ambulatory Visit: Payer: Self-pay

## 2017-12-31 ENCOUNTER — Observation Stay (HOSPITAL_COMMUNITY): Payer: BLUE CROSS/BLUE SHIELD

## 2017-12-31 ENCOUNTER — Observation Stay (HOSPITAL_COMMUNITY)
Admission: EM | Admit: 2017-12-31 | Discharge: 2018-01-01 | Disposition: A | Discharge status: Home / Self Care | Payer: BLUE CROSS/BLUE SHIELD | Attending: Obstetrics and Gynecology | Admitting: Obstetrics and Gynecology

## 2017-12-31 ENCOUNTER — Encounter (HOSPITAL_COMMUNITY): Payer: Self-pay

## 2017-12-31 ENCOUNTER — Emergency Department (HOSPITAL_COMMUNITY): Payer: BLUE CROSS/BLUE SHIELD

## 2017-12-31 DIAGNOSIS — Z7984 Long term (current) use of oral hypoglycemic drugs: Secondary | ICD-10-CM | POA: Diagnosis not present

## 2017-12-31 DIAGNOSIS — E119 Type 2 diabetes mellitus without complications: Secondary | ICD-10-CM | POA: Diagnosis not present

## 2017-12-31 DIAGNOSIS — N83209 Unspecified ovarian cyst, unspecified side: Secondary | ICD-10-CM | POA: Diagnosis present

## 2017-12-31 DIAGNOSIS — N839 Noninflammatory disorder of ovary, fallopian tube and broad ligament, unspecified: Secondary | ICD-10-CM | POA: Diagnosis not present

## 2017-12-31 DIAGNOSIS — I10 Essential (primary) hypertension: Secondary | ICD-10-CM | POA: Diagnosis not present

## 2017-12-31 DIAGNOSIS — N83519 Torsion of ovary and ovarian pedicle, unspecified side: Principal | ICD-10-CM | POA: Diagnosis present

## 2017-12-31 DIAGNOSIS — R102 Pelvic and perineal pain: Secondary | ICD-10-CM | POA: Diagnosis not present

## 2017-12-31 DIAGNOSIS — D252 Subserosal leiomyoma of uterus: Secondary | ICD-10-CM | POA: Diagnosis not present

## 2017-12-31 DIAGNOSIS — Z87891 Personal history of nicotine dependence: Secondary | ICD-10-CM | POA: Diagnosis not present

## 2017-12-31 DIAGNOSIS — Z8673 Personal history of transient ischemic attack (TIA), and cerebral infarction without residual deficits: Secondary | ICD-10-CM | POA: Insufficient documentation

## 2017-12-31 DIAGNOSIS — N83201 Unspecified ovarian cyst, right side: Secondary | ICD-10-CM | POA: Insufficient documentation

## 2017-12-31 DIAGNOSIS — Z7982 Long term (current) use of aspirin: Secondary | ICD-10-CM | POA: Insufficient documentation

## 2017-12-31 DIAGNOSIS — R112 Nausea with vomiting, unspecified: Secondary | ICD-10-CM | POA: Diagnosis not present

## 2017-12-31 DIAGNOSIS — K2970 Gastritis, unspecified, without bleeding: Secondary | ICD-10-CM | POA: Diagnosis not present

## 2017-12-31 DIAGNOSIS — R1031 Right lower quadrant pain: Secondary | ICD-10-CM | POA: Diagnosis not present

## 2017-12-31 DIAGNOSIS — D259 Leiomyoma of uterus, unspecified: Secondary | ICD-10-CM | POA: Diagnosis not present

## 2017-12-31 DIAGNOSIS — N838 Other noninflammatory disorders of ovary, fallopian tube and broad ligament: Secondary | ICD-10-CM

## 2017-12-31 DIAGNOSIS — J45909 Unspecified asthma, uncomplicated: Secondary | ICD-10-CM | POA: Insufficient documentation

## 2017-12-31 HISTORY — DX: Benign neoplasm of connective and other soft tissue, unspecified: D21.9

## 2017-12-31 LAB — URINALYSIS, ROUTINE W REFLEX MICROSCOPIC
Bilirubin Urine: NEGATIVE
Glucose, UA: NEGATIVE mg/dL
Ketones, ur: NEGATIVE mg/dL
Leukocytes, UA: NEGATIVE
Nitrite: NEGATIVE
PROTEIN: NEGATIVE mg/dL
Specific Gravity, Urine: 1.013 (ref 1.005–1.030)
pH: 9 — ABNORMAL HIGH (ref 5.0–8.0)

## 2017-12-31 LAB — COMPREHENSIVE METABOLIC PANEL
ALBUMIN: 4.2 g/dL (ref 3.5–5.0)
ALT: 20 U/L (ref 14–54)
AST: 17 U/L (ref 15–41)
Alkaline Phosphatase: 73 U/L (ref 38–126)
Anion gap: 10 (ref 5–15)
BUN: 12 mg/dL (ref 6–20)
CHLORIDE: 104 mmol/L (ref 101–111)
CO2: 24 mmol/L (ref 22–32)
Calcium: 9.1 mg/dL (ref 8.9–10.3)
Creatinine, Ser: 0.81 mg/dL (ref 0.44–1.00)
GFR calc Af Amer: >60 mL/min (ref >60)
GFR calc non Af Amer: >60 mL/min (ref >60)
GLUCOSE: 94 mg/dL (ref 65–99)
POTASSIUM: 3.8 mmol/L (ref 3.5–5.1)
Sodium: 138 mmol/L (ref 135–145)
Total Bilirubin: 0.5 mg/dL (ref 0.3–1.2)
Total Protein: 7.7 g/dL (ref 6.5–8.1)

## 2017-12-31 LAB — CBC WITH DIFFERENTIAL/PLATELET
BASOS ABS: 0 10*3/uL (ref 0.0–0.1)
BASOS PCT: 1 %
EOS PCT: 1 %
Eosinophils Absolute: 0.1 10*3/uL (ref 0.0–0.7)
HCT: 40.8 % (ref 36.0–46.0)
Hemoglobin: 13.3 g/dL (ref 12.0–15.0)
Lymphocytes Relative: 29 %
Lymphs Abs: 2.1 10*3/uL (ref 0.7–4.0)
MCH: 29.4 pg (ref 26.0–34.0)
MCHC: 32.6 g/dL (ref 30.0–36.0)
MCV: 90.3 fL (ref 78.0–100.0)
MONO ABS: 0.3 10*3/uL (ref 0.1–1.0)
Monocytes Relative: 4 %
NEUTROS ABS: 4.8 10*3/uL (ref 1.7–7.7)
Neutrophils Relative %: 65 %
PLATELETS: 391 10*3/uL (ref 150–400)
RBC: 4.52 MIL/uL (ref 3.87–5.11)
RDW: 14.8 % (ref 11.5–15.5)
WBC: 7.3 10*3/uL (ref 4.0–10.5)

## 2017-12-31 LAB — GLUCOSE, CAPILLARY: Glucose-Capillary: 88 mg/dL (ref 65–99)

## 2017-12-31 LAB — POC URINE PREG, ED: PREG TEST UR: NEGATIVE

## 2017-12-31 MED ORDER — OXYCODONE-ACETAMINOPHEN 5-325 MG PO TABS
1.0000 | ORAL_TABLET | ORAL | Status: DC | PRN
  Administered 2017-12-31: 2 via ORAL
  Administered 2018-01-01 (×2): 1 via ORAL
  Administered 2018-01-01: 2 via ORAL
  Filled 2017-12-31: qty 2
  Filled 2017-12-31: qty 1
  Filled 2017-12-31: qty 2
  Filled 2017-12-31: qty 1

## 2017-12-31 MED ORDER — MORPHINE SULFATE (PF) 4 MG/ML IV SOLN
4.0000 mg | Freq: Once | INTRAVENOUS | Status: AC
  Administered 2017-12-31: 4 mg via INTRAVENOUS
  Filled 2017-12-31: qty 1

## 2017-12-31 MED ORDER — PRENATAL MULTIVITAMIN CH: 1.0000 | ORAL_TABLET | Freq: Every day | ORAL | Status: DC

## 2017-12-31 MED ORDER — HYDROXYZINE PAMOATE 25 MG PO CAPS
25.0000 mg | ORAL_CAPSULE | Freq: Every day | ORAL | Status: DC | PRN
  Filled 2017-12-31: qty 1

## 2017-12-31 MED ORDER — CANAGLIFLOZIN 100 MG PO TABS
100.0000 mg | ORAL_TABLET | Freq: Every day | ORAL | Status: DC
  Filled 2017-12-31 (×2): qty 1

## 2017-12-31 MED ORDER — MORPHINE SULFATE (PF) 4 MG/ML IV SOLN
4.0000 mg | Freq: Once | INTRAVENOUS | Status: AC
Start: 2017-12-31 — End: 2017-12-31
  Administered 2017-12-31: 4 mg via INTRAVENOUS
  Filled 2017-12-31: qty 1

## 2017-12-31 MED ORDER — ONDANSETRON HCL 4 MG/2ML IJ SOLN
4.0000 mg | Freq: Once | INTRAMUSCULAR | Status: AC
  Administered 2017-12-31: 4 mg via INTRAVENOUS
  Filled 2017-12-31: qty 2

## 2017-12-31 MED ORDER — AMLODIPINE BESYLATE 5 MG PO TABS
2.5000 mg | ORAL_TABLET | Freq: Every day | ORAL | Status: DC
  Administered 2018-01-01: 2.5 mg via ORAL
  Filled 2017-12-31: qty 0.5

## 2017-12-31 MED ORDER — METFORMIN HCL 500 MG PO TABS
1000.0000 mg | ORAL_TABLET | Freq: Two times a day (BID) | ORAL | Status: DC
  Administered 2018-01-01 (×2): 1000 mg via ORAL
  Filled 2017-12-31 (×3): qty 2

## 2017-12-31 MED ORDER — IBUPROFEN 600 MG PO TABS
600.0000 mg | ORAL_TABLET | Freq: Four times a day (QID) | ORAL | Status: DC | PRN
  Administered 2018-01-01: 600 mg via ORAL
  Filled 2017-12-31: qty 1

## 2017-12-31 MED ORDER — ATORVASTATIN CALCIUM 20 MG PO TABS
20.0000 mg | ORAL_TABLET | Freq: Every day | ORAL | Status: DC
  Filled 2017-12-31: qty 1

## 2017-12-31 MED ORDER — DOXEPIN HCL 10 MG PO CAPS
10.0000 mg | ORAL_CAPSULE | Freq: Every day | ORAL | Status: DC
  Administered 2017-12-31: 10 mg via ORAL
  Filled 2017-12-31 (×2): qty 1

## 2017-12-31 NOTE — ED Provider Notes (Signed)
[facility] EMERGENCY DEPARTMENT Provider Note   CSN: [id number] Arrival date & time: 12/31/17  1042     History   Chief Complaint Chief Complaint  Patient presents with  . Abdominal Pain    HPI Joanna Newman is a 47 y.o. female presenting with right lower quadrant abdominal pain which is rather sudden onset and timing and started 2 hours before arrival.  She describes sharp constant pain with radiation of pain into her mid lower abdomen and into her right lower back.  She also endorses nausea without emesis and has been afebrile to her knowledge.  She has started taking a new medication for her diabetes, Prandin, first dose yesterday evening.  She repeated this dose this morning and an hour later her pain began.  She does endorse history of fibroids and ovarian cyst.  Her LMP was 10/02/2017, endorses increasing irregular periods.  Denies possible pregnancy.  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  Surgical history includes cholecystectomy and tubal ligation.  She has had no medications prior to arrival and has found no alleviators or aggravators for her pain.  Denies dysuria, vaginal discharge.  Her last bowel movement was 1 hour prior to onset of symptoms and was normal.  The history is provided by the patient.    Past Medical History:  Diagnosis Date  . Anxiety   . Asthma   . Diabetes mellitus without complication ([place])   . Fibroids   . Hypertension   . Insomnia   . Obesity   . Substance abuse ([place])    cocaine, marijuana and alcohol abuse. quit end of 2012    Patient Active Problem List   Diagnosis Date Noted  . Ovarian torsion 12/31/2017  . Impingement of right ankle joint 04/05/2017  . Acute CVA (cerebrovascular accident) ([place]) 08/06/2016  . Paresthesia 08/04/2016  . Hyponatremia 08/04/2016  . Essential hypertension 12/25/2014  . Insomnia 12/25/2014  . Asthma, chronic 12/25/2014  .  Morbid obesity ([place]) 12/25/2014  . Bilateral ovarian cysts 12/16/2014  . Diabetes ([place]) 12/16/2014  . Morbid obesity with BMI of 50.0-59.9, adult ([place]) 05/20/2012  . Vaginal itching 05/20/2012    Past Surgical History:  Procedure Laterality Date  . CHOLECYSTECTOMY    . KNEE SURGERY    . TUBAL LIGATION       OB History    Gravida  5   Para  3   Term  3   Preterm      AB  2   Living  3     SAB      TAB  2   Ectopic      Multiple      Live Births               Home Medications    Prior to Admission medications   Medication Sig Start Date End Date Taking? Authorizing Provider  amLODipine (NORVASC) 2.5 MG tablet Take 1 tablet (2.5 mg total) by mouth daily. 08/06/16   Verlee Monte, MD  aspirin EC 325 MG EC tablet Take 1 tablet (325 mg total) by mouth daily. 08/06/16   Verlee Monte, MD  atorvastatin (LIPITOR) 20 MG tablet Take 1 tablet (20 mg total) by mouth daily at 6 PM. 08/06/16   Verlee Monte, MD  dapagliflozin propanediol (FARXIGA) 10 MG TABS tablet Take 10 mg by mouth daily.    [provider]  doxepin (SINEQUAN) 10 MG capsule Take 10 mg by  mouth at bedtime.    [provider]  HYDROcodone-acetaminophen (NORCO/VICODIN) 5-325 MG tablet Take one tab po q 4-6 hrs prn pain 01/19/17   Triplett, Tammy, PA-C  hydrOXYzine (VISTARIL) 25 MG capsule Take 25 mg by mouth daily as needed for anxiety or itching.     [provider]  metFORMIN (GLUCOPHAGE) 500 MG tablet Take 1 tablet (500 mg total) by mouth 2 (two) times daily with a meal. Patient taking differently: Take 1,000 mg by mouth 2 (two) times daily with a meal.  08/06/16   Verlee Monte, MD    Family History Family History  Problem Relation Age of Onset  . Hypertension Mother   . Cancer Mother 44       colon  . Heart disease Father 64       heart failure  . Diabetes Father   . Heart failure Father   . Diabetes Maternal Grandmother   . CVA Maternal Grandmother 51  . Diabetes  Paternal Grandmother   . CVA Maternal Aunt 38       Aneurysm    Social History Social History   Tobacco Use  . Smoking status: Former Smoker    Packs/day: 0.25    Years: 15.00    Pack years: 3.75    Types: Cigarettes    Last attempt to quit: 03/11/2012    Years since quitting: 5.8  . Smokeless tobacco: Never Used  Substance Use Topics  . Alcohol use: Yes    Comment: once a month  . Drug use: Yes    Types: Marijuana    Comment:  history of substance abuse; last marijuana use about 1 week ago     Allergies   Lisinopril   Review of Systems Review of Systems  Constitutional: Negative for chills and fever.  HENT: Negative for congestion and sore throat.   Eyes: Negative.   Respiratory: Negative for chest tightness and shortness of breath.   Cardiovascular: Negative for chest pain.  Gastrointestinal: Positive for abdominal pain and nausea. Negative for vomiting.  Genitourinary: Negative.   Musculoskeletal: Negative for arthralgias, joint swelling and neck pain.  Skin: Negative.  Negative for rash and wound.  Neurological: Negative for dizziness, weakness, light-headedness, numbness and headaches.  Psychiatric/Behavioral: Negative.      Physical Exam Updated Vital Signs BP 140/79 (BP Location: Left Arm)   Pulse 69   Temp 97.6 F (36.4 C)   Resp 16   Wt 131.5 kg (290 lb)   LMP 10/02/2017   SpO2 100%   BMI 51.37 kg/m   Physical Exam  Constitutional: She appears well-developed and well-nourished.  HENT:  Head: Normocephalic and atraumatic.  Eyes: Conjunctivae are normal.  Neck: Normal range of motion.  Cardiovascular: Normal rate, regular rhythm, normal heart sounds and intact distal pulses.  Pulmonary/Chest: Effort normal and breath sounds normal. She has no wheezes.  Abdominal: Soft. Bowel sounds are normal. There is tenderness in the right lower quadrant. There is no rebound and no guarding.  Difficult exam given body habitus.  Musculoskeletal: Normal range  of motion.  Neurological: She is alert.  Skin: Skin is warm and dry.  Psychiatric: She has a normal mood and affect.  Nursing note and vitals reviewed.    ED Treatments / Results  Labs (all labs ordered are listed, but only abnormal results are displayed) Labs Reviewed  URINALYSIS, ROUTINE W REFLEX MICROSCOPIC - Abnormal; Notable for the following components:      Result Value   APPearance HAZY (*)  pH 9.0 (*)    Hgb urine dipstick LARGE (*)    Bacteria, UA RARE (*)    Squamous Epithelial / LPF 0-5 (*)    All other components within normal limits  CBC WITH DIFFERENTIAL/PLATELET  COMPREHENSIVE METABOLIC PANEL  POC URINE PREG, ED    EKG None  Radiology US Transvaginal Non-ob  Addendum Date: 12/31/2017   ADDENDUM REPORT: 12/31/2017 14:20 ADDENDUM: The patient had a pelvis MRI dated 12/06/2014. This confirmed that the solid left adnexal mass is an exophytic fibroid. Since the complex right ovarian cyst is smaller and the mural nodule did not have internal blood flow with color Doppler, this is compatible with a benign cystic lesion. Therefore, the patient does not need MRI of the pelvis at this time. Electronically Signed   By: Claudie Revering M.D.   On: 12/31/2017 14:20   Result Date: 12/31/2017 CLINICAL DATA:  Sudden onset right lower quadrant abdominal pain. History of ovarian cyst. EXAM: TRANSABDOMINAL AND TRANSVAGINAL ULTRASOUND OF PELVIS DOPPLER ULTRASOUND OF OVARIES TECHNIQUE: Both transabdominal and transvaginal ultrasound examinations of the pelvis were performed. Transabdominal technique was performed for global imaging of the pelvis including uterus, ovaries, adnexal regions, and pelvic cul-de-sac. It was necessary to proceed with endovaginal exam following the transabdominal exam to visualize the uterus and ovaries in better detail. Color and duplex Doppler ultrasound was utilized to evaluate blood flow to the ovaries. COMPARISON:  Pelvic ultrasound dated 12/05/2014 and  11/06/2014. Pelvis CT dated 12/05/2014. FINDINGS: Uterus Measurements: 13.9 x 8.0 x 8.0 cm. Three oval, solid masses in the myometrium. One measures 7.8 x 7.1 x 6.1 cm. Another measures 5.4 x 4.0 x 4.0 cm. The 3rd measures 3.4 x 2.5 x 2.2 cm. Endometrium Thickness: 8.9 mm.  No focal abnormality visualized. Right ovary Measurements: Poorly visualized, measuring approximately 3.1 x 2.4 x 1.8 cm. 2.2 cm exophytic or paraovarian cyst with minimal internal echoes and no internal blood flow with color Doppler. Otherwise, the right ovary has a normal appearance. Again demonstrated is a right adnexal cyst containing a rounded mural nodule. The cyst measures 10.0 x 8.5 x 6.6 cm, previously 10.9 x 10.4 x 8.9 cm. The mural nodule currently measures 2.0 x 1.8 x 1.2 cm and has no internal blood flow with color Doppler. This previously measured 3.4 x 2.8 x 2.3 cm with adjacent soft tissue thickening, not currently seen. Left ovary Measurements: 4.4 x 2.7 x 1.8 cm. Normal appearing left ovary. Again demonstrated is a solid left adnexal mass with internal blood flow with color Doppler. This measures 7.7 x 6.1 x 5.6 cm, previously 8.3 x 8.1 x 7.2 cm. Pulsed Doppler evaluation of both ovaries demonstrates normal low-resistance arterial and venous waveforms on the left. Arterial waveforms could not be established on the right with pulsed Doppler and strong venous waveforms could not be established with pulsed Doppler on the right. There was a small amount of internal blood flow within what appears to be the right ovary with color Doppler. Other findings No abnormal free fluid. IMPRESSION: 1. Poorly visualized right ovary with findings suggesting compromised internal blood flow within the ovary. There is no ovarian enlargement to indicate torsion. The vascular pedicle could be compromised by a large adjacent uterine fibroid. It is difficult to determine if this is actually the ovary, since the ovary was not visualized as a separate  structure on the previous ultrasound examinations. 2. Three large uterine fibroids. 3. Interval decrease in size of a solid left adnexal mass, currently measuring  7.7 x 6.1 x 5.6 cm. This could potentially represent a 4th exophytic uterine fibroid. 4. Interval decrease in size of a complex cystic right ovarian mass, currently measuring 10.0 x 8.5 x 6.6 cm. As previously suggested, the anatomy of the uterus and ovaries could be better defined with elective pre and postcontrast magnetic resonance imaging of the pelvis. Electronically Signed: By: Claudie Revering M.D. On: 12/31/2017 13:55   US Pelvis Complete  Addendum Date: 12/31/2017   ADDENDUM REPORT: 12/31/2017 14:20 ADDENDUM: The patient had a pelvis MRI dated 12/06/2014. This confirmed that the solid left adnexal mass is an exophytic fibroid. Since the complex right ovarian cyst is smaller and the mural nodule did not have internal blood flow with color Doppler, this is compatible with a benign cystic lesion. Therefore, the patient does not need MRI of the pelvis at this time. Electronically Signed   By: Claudie Revering M.D.   On: 12/31/2017 14:20   Result Date: 12/31/2017 CLINICAL DATA:  Sudden onset right lower quadrant abdominal pain. History of ovarian cyst. EXAM: TRANSABDOMINAL AND TRANSVAGINAL ULTRASOUND OF PELVIS DOPPLER ULTRASOUND OF OVARIES TECHNIQUE: Both transabdominal and transvaginal ultrasound examinations of the pelvis were performed. Transabdominal technique was performed for global imaging of the pelvis including uterus, ovaries, adnexal regions, and pelvic cul-de-sac. It was necessary to proceed with endovaginal exam following the transabdominal exam to visualize the uterus and ovaries in better detail. Color and duplex Doppler ultrasound was utilized to evaluate blood flow to the ovaries. COMPARISON:  Pelvic ultrasound dated 12/05/2014 and 11/06/2014. Pelvis CT dated 12/05/2014. FINDINGS: Uterus Measurements: 13.9 x 8.0 x 8.0 cm. Three oval,  solid masses in the myometrium. One measures 7.8 x 7.1 x 6.1 cm. Another measures 5.4 x 4.0 x 4.0 cm. The 3rd measures 3.4 x 2.5 x 2.2 cm. Endometrium Thickness: 8.9 mm.  No focal abnormality visualized. Right ovary Measurements: Poorly visualized, measuring approximately 3.1 x 2.4 x 1.8 cm. 2.2 cm exophytic or paraovarian cyst with minimal internal echoes and no internal blood flow with color Doppler. Otherwise, the right ovary has a normal appearance. Again demonstrated is a right adnexal cyst containing a rounded mural nodule. The cyst measures 10.0 x 8.5 x 6.6 cm, previously 10.9 x 10.4 x 8.9 cm. The mural nodule currently measures 2.0 x 1.8 x 1.2 cm and has no internal blood flow with color Doppler. This previously measured 3.4 x 2.8 x 2.3 cm with adjacent soft tissue thickening, not currently seen. Left ovary Measurements: 4.4 x 2.7 x 1.8 cm. Normal appearing left ovary. Again demonstrated is a solid left adnexal mass with internal blood flow with color Doppler. This measures 7.7 x 6.1 x 5.6 cm, previously 8.3 x 8.1 x 7.2 cm. Pulsed Doppler evaluation of both ovaries demonstrates normal low-resistance arterial and venous waveforms on the left. Arterial waveforms could not be established on the right with pulsed Doppler and strong venous waveforms could not be established with pulsed Doppler on the right. There was a small amount of internal blood flow within what appears to be the right ovary with color Doppler. Other findings No abnormal free fluid. IMPRESSION: 1. Poorly visualized right ovary with findings suggesting compromised internal blood flow within the ovary. There is no ovarian enlargement to indicate torsion. The vascular pedicle could be compromised by a large adjacent uterine fibroid. It is difficult to determine if this is actually the ovary, since the ovary was not visualized as a separate structure on the previous ultrasound examinations. 2. Three large  uterine fibroids. 3. Interval decrease in  size of a solid left adnexal mass, currently measuring 7.7 x 6.1 x 5.6 cm. This could potentially represent a 4th exophytic uterine fibroid. 4. Interval decrease in size of a complex cystic right ovarian mass, currently measuring 10.0 x 8.5 x 6.6 cm. As previously suggested, the anatomy of the uterus and ovaries could be better defined with elective pre and postcontrast magnetic resonance imaging of the pelvis. Electronically Signed: By: Claudie Revering M.D. On: 12/31/2017 13:55   [place] Art/ven Flow Abd Pelv Doppler  Addendum Date: 12/31/2017   ADDENDUM REPORT: 12/31/2017 14:20 ADDENDUM: The patient had a pelvis MRI dated 12/06/2014. This confirmed that the solid left adnexal mass is an exophytic fibroid. Since the complex right ovarian cyst is smaller and the mural nodule did not have internal blood flow with color Doppler, this is compatible with a benign cystic lesion. Therefore, the patient does not need MRI of the pelvis at this time. Electronically Signed   By: Claudie Revering M.D.   On: 12/31/2017 14:20   Result Date: 12/31/2017 CLINICAL DATA:  Sudden onset right lower quadrant abdominal pain. History of ovarian cyst. EXAM: TRANSABDOMINAL AND TRANSVAGINAL ULTRASOUND OF PELVIS DOPPLER ULTRASOUND OF OVARIES TECHNIQUE: Both transabdominal and transvaginal ultrasound examinations of the pelvis were performed. Transabdominal technique was performed for global imaging of the pelvis including uterus, ovaries, adnexal regions, and pelvic cul-de-sac. It was necessary to proceed with endovaginal exam following the transabdominal exam to visualize the uterus and ovaries in better detail. Color and duplex Doppler ultrasound was utilized to evaluate blood flow to the ovaries. COMPARISON:  Pelvic ultrasound dated 12/05/2014 and 11/06/2014. Pelvis CT dated 12/05/2014. FINDINGS: Uterus Measurements: 13.9 x 8.0 x 8.0 cm. Three oval, solid masses in the myometrium. One measures 7.8 x 7.1 x 6.1 cm. Another measures 5.4 x 4.0 x 4.0  cm. The 3rd measures 3.4 x 2.5 x 2.2 cm. Endometrium Thickness: 8.9 mm.  No focal abnormality visualized. Right ovary Measurements: Poorly visualized, measuring approximately 3.1 x 2.4 x 1.8 cm. 2.2 cm exophytic or paraovarian cyst with minimal internal echoes and no internal blood flow with color Doppler. Otherwise, the right ovary has a normal appearance. Again demonstrated is a right adnexal cyst containing a rounded mural nodule. The cyst measures 10.0 x 8.5 x 6.6 cm, previously 10.9 x 10.4 x 8.9 cm. The mural nodule currently measures 2.0 x 1.8 x 1.2 cm and has no internal blood flow with color Doppler. This previously measured 3.4 x 2.8 x 2.3 cm with adjacent soft tissue thickening, not currently seen. Left ovary Measurements: 4.4 x 2.7 x 1.8 cm. Normal appearing left ovary. Again demonstrated is a solid left adnexal mass with internal blood flow with color Doppler. This measures 7.7 x 6.1 x 5.6 cm, previously 8.3 x 8.1 x 7.2 cm. Pulsed Doppler evaluation of both ovaries demonstrates normal low-resistance arterial and venous waveforms on the left. Arterial waveforms could not be established on the right with pulsed Doppler and strong venous waveforms could not be established with pulsed Doppler on the right. There was a small amount of internal blood flow within what appears to be the right ovary with color Doppler. Other findings No abnormal free fluid. IMPRESSION: 1. Poorly visualized right ovary with findings suggesting compromised internal blood flow within the ovary. There is no ovarian enlargement to indicate torsion. The vascular pedicle could be compromised by a large adjacent uterine fibroid. It is difficult to determine if this is actually the  ovary, since the ovary was not visualized as a separate structure on the previous ultrasound examinations. 2. Three large uterine fibroids. 3. Interval decrease in size of a solid left adnexal mass, currently measuring 7.7 x 6.1 x 5.6 cm. This could potentially  represent a 4th exophytic uterine fibroid. 4. Interval decrease in size of a complex cystic right ovarian mass, currently measuring 10.0 x 8.5 x 6.6 cm. As previously suggested, the anatomy of the uterus and ovaries could be better defined with elective pre and postcontrast magnetic resonance imaging of the pelvis. Electronically Signed: By: Claudie Revering M.D. On: 12/31/2017 13:55    Procedures Procedures (including critical care time)  Medications Ordered in ED Medications  morphine 4 MG/ML injection 4 mg (4 mg Intravenous Given 12/31/17 1300)  ondansetron (ZOFRAN) injection 4 mg (4 mg Intravenous Given 12/31/17 1301)  morphine 4 MG/ML injection 4 mg (4 mg Intravenous Given 12/31/17 1458)     Initial Impression / Assessment and Plan / ED Course  I have reviewed the triage vital signs and the nursing notes.  Pertinent labs & imaging results that were available during my care of the patient were reviewed by me and considered in my medical decision making (see chart for details).     Pt with normal labs with the exception of hematuria. Upon discussion, pt reports just started her menses since arriving here. Significant bilateral ovarian cysts/masses bilaterally along with large uterine fibroids but stable.  She does have suggestion of reduced blood flow to the right ovary, but no torsion at this time.  Pt was treated with morphine here with transient resolved pain, now recurrent.    Discussed with Dr. Sabra Heck and also discussed with Dr. Elly Modena who accepts patient as direct admission to [facility] for overnight observation. Findings concerning for possible right ovarian torsion, although not torsed on [place], there is compromised blood flow with large ovarian cyst present.     Final Clinical Impressions(s) / ED Diagnoses   Final diagnoses:  Pelvic pain in female  Ovarian mass, right    ED Discharge Orders    None       Landis Martins 12/31/17 1518    Noemi Chapel,  MD 01/01/18 [phone]

## 2017-12-31 NOTE — H&P (Signed)
Joanna Newman is an 47 y.o. female [phone] with LMP 12/31/2017 transferred from [facility] for observation to rule out ovarian torsion. Patient presented to the ED with sudden onset shartp and Joanna Newman lower abdominal pain. The pain is localized to the right lower quadrant and radiates throughout the abdomen. She reports some associated nausea but denies any emesis. She states that walking seems to make the pain worst. She has known fibroid uterus and bilateral ovarian cysts. She was seen by Dr. Denman George in 2016 who felt she was a poor surgical candidate due to her morbid obesity and poorly controlled diabetes.      Past Medical History:  Diagnosis Date  . Anxiety   . Asthma   . Diabetes mellitus without complication ([place])   . Fibroids   . Hypertension   . Insomnia   . Obesity   . Substance abuse ([place])    cocaine, marijuana and alcohol abuse. quit end of 2012    Past Surgical History:  Procedure Laterality Date  . CHOLECYSTECTOMY    . KNEE SURGERY    . TUBAL LIGATION      Family History  Problem Relation Age of Onset  . Hypertension Mother   . Cancer Mother 51       colon  . Heart disease Father 33       heart failure  . Diabetes Father   . Heart failure Father   . Diabetes Maternal Grandmother   . CVA Maternal Grandmother 23  . Diabetes Paternal Grandmother   . CVA Maternal Aunt 38       Aneurysm    Social History:  reports that she quit smoking about 5 years ago. Her smoking use included cigarettes. She has a 3.75 pack-year smoking history. She has never used smokeless tobacco. She reports that she drinks alcohol. She reports that she has current or past drug history. Drug: Marijuana.  Allergies:  Allergies  Allergen Reactions  . Lisinopril Swelling    Medications Prior to Admission  Medication Sig Dispense Refill Last Dose  . amLODipine (NORVASC) 2.5 MG tablet Take 1 tablet (2.5 mg total) by mouth daily. 30 tablet 0 Taking  . aspirin EC 325 MG EC tablet  Take 1 tablet (325 mg total) by mouth daily.   Taking  . atorvastatin (LIPITOR) 20 MG tablet Take 1 tablet (20 mg total) by mouth daily at 6 PM. 30 tablet 0 Taking  . dapagliflozin propanediol (FARXIGA) 10 MG TABS tablet Take 10 mg by mouth daily.   Taking  . doxepin (SINEQUAN) 10 MG capsule Take 10 mg by mouth at bedtime.   Taking  . HYDROcodone-acetaminophen (NORCO/VICODIN) 5-325 MG tablet Take one tab po q 4-6 hrs prn pain 12 tablet 0 Taking  . hydrOXYzine (VISTARIL) 25 MG capsule Take 25 mg by mouth daily as needed for anxiety or itching.    Taking  . metFORMIN (GLUCOPHAGE) 500 MG tablet Take 1 tablet (500 mg total) by mouth 2 (two) times daily with a meal. (Patient taking differently: Take 1,000 mg by mouth 2 (two) times daily with a meal. ) 60 tablet 2 Taking    ROS See pertinent in HPI  Blood pressure (!) 147/82, pulse 61, temperature 98.1 F (36.7 C), temperature source Oral, resp. rate 18, weight 269 lb (122 kg), last menstrual period 10/02/2017, SpO2 97 %. Physical Exam GENERAL: Well-developed, well-nourished female in no acute distress.  LUNGS: Clear to auscultation bilaterally.  HEART: Regular rate and rhythm. ABDOMEN: Soft, nontender, nondistended.  No organomegaly. PELVIC: Normal external female genitalia. Vagina is pink and rugated.  Normal discharge. Normal appearing cervix. Uterus is enlarged in size. Bimanual exam limited secondary to body habitus. Mild tenderness on exam. EXTREMITIES: No cyanosis, clubbing, or edema, 2+ distal pulses.  Results for orders placed or performed during the hospital encounter of 12/31/17 (from the past 24 hour(s))  Urinalysis, Routine w reflex microscopic     Status: Abnormal   Collection Time: 12/31/17 11:15 AM  Result Value Ref Range   Color, Urine YELLOW YELLOW   APPearance HAZY (A) CLEAR   Specific Gravity, Urine 1.013 1.005 - 1.030   pH 9.0 (H) 5.0 - 8.0   Glucose, UA NEGATIVE NEGATIVE mg/dL   Hgb urine dipstick LARGE (A) NEGATIVE    Bilirubin Urine NEGATIVE NEGATIVE   Ketones, ur NEGATIVE NEGATIVE mg/dL   Protein, ur NEGATIVE NEGATIVE mg/dL   Nitrite NEGATIVE NEGATIVE   Leukocytes, UA NEGATIVE NEGATIVE   RBC / HPF TOO NUMEROUS TO COUNT 0 - 5 RBC/hpf   WBC, UA 6-30 0 - 5 WBC/hpf   Bacteria, UA RARE (A) NONE SEEN   Squamous Epithelial / LPF 0-5 (A) NONE SEEN  CBC with Differential     Status: None   Collection Time: 12/31/17 11:43 AM  Result Value Ref Range   WBC 7.3 4.0 - 10.5 K/uL   RBC 4.52 3.87 - 5.11 MIL/uL   Hemoglobin 13.3 12.0 - 15.0 g/dL   HCT 40.8 36.0 - 46.0 %   MCV 90.3 78.0 - 100.0 fL   MCH 29.4 26.0 - 34.0 pg   MCHC 32.6 30.0 - 36.0 g/dL   RDW 14.8 11.5 - 15.5 %   Platelets 391 150 - 400 K/uL   Neutrophils Relative % 65 %   Neutro Abs 4.8 1.7 - 7.7 K/uL   Lymphocytes Relative 29 %   Lymphs Abs 2.1 0.7 - 4.0 K/uL   Monocytes Relative 4 %   Monocytes Absolute 0.3 0.1 - 1.0 K/uL   Eosinophils Relative 1 %   Eosinophils Absolute 0.1 0.0 - 0.7 K/uL   Basophils Relative 1 %   Basophils Absolute 0.0 0.0 - 0.1 K/uL  Comprehensive metabolic panel     Status: None   Collection Time: 12/31/17 11:43 AM  Result Value Ref Range   Sodium 138 135 - 145 mmol/L   Potassium 3.8 3.5 - 5.1 mmol/L   Chloride 104 101 - 111 mmol/L   CO2 24 22 - 32 mmol/L   Glucose, Bld 94 65 - 99 mg/dL   BUN 12 6 - 20 mg/dL   Creatinine, Ser 0.81 0.44 - 1.00 mg/dL   Calcium 9.1 8.9 - 10.3 mg/dL   Total Protein 7.7 6.5 - 8.1 g/dL   Albumin 4.2 3.5 - 5.0 g/dL   AST 17 15 - 41 U/L   ALT 20 14 - 54 U/L   Alkaline Phosphatase 73 38 - 126 U/L   Total Bilirubin 0.5 0.3 - 1.2 mg/dL   GFR calc non Af Amer >60 >60 mL/min   GFR calc Af Amer >60 >60 mL/min   Anion gap 10 5 - 15  POC urine preg, ED (not at [facility])     Status: None   Collection Time: 12/31/17 12:11 PM  Result Value Ref Range   Preg Test, Ur NEGATIVE NEGATIVE    US Transvaginal Non-ob  Addendum Date: 12/31/2017   ADDENDUM REPORT: 12/31/2017 14:20 ADDENDUM: The  patient had a pelvis MRI dated 12/06/2014. This confirmed that the  solid left adnexal mass is an exophytic fibroid. Since the complex right ovarian cyst is smaller and the mural nodule did not have internal blood flow with color Doppler, this is compatible with a benign cystic lesion. Therefore, the patient does not need MRI of the pelvis at this time. Electronically Signed   By: Claudie Revering M.D.   On: 12/31/2017 14:20   Result Date: 12/31/2017 CLINICAL DATA:  Sudden onset right lower quadrant abdominal pain. History of ovarian cyst. EXAM: TRANSABDOMINAL AND TRANSVAGINAL ULTRASOUND OF PELVIS DOPPLER ULTRASOUND OF OVARIES TECHNIQUE: Both transabdominal and transvaginal ultrasound examinations of the pelvis were performed. Transabdominal technique was performed for global imaging of the pelvis including uterus, ovaries, adnexal regions, and pelvic cul-de-sac. It was necessary to proceed with endovaginal exam following the transabdominal exam to visualize the uterus and ovaries in better detail. Color and duplex Doppler ultrasound was utilized to evaluate blood flow to the ovaries. COMPARISON:  Pelvic ultrasound dated 12/05/2014 and 11/06/2014. Pelvis CT dated 12/05/2014. FINDINGS: Uterus Measurements: 13.9 x 8.0 x 8.0 cm. Three oval, solid masses in the myometrium. One measures 7.8 x 7.1 x 6.1 cm. Another measures 5.4 x 4.0 x 4.0 cm. The 3rd measures 3.4 x 2.5 x 2.2 cm. Endometrium Thickness: 8.9 mm.  No focal abnormality visualized. Right ovary Measurements: Poorly visualized, measuring approximately 3.1 x 2.4 x 1.8 cm. 2.2 cm exophytic or paraovarian cyst with minimal internal echoes and no internal blood flow with color Doppler. Otherwise, the right ovary has a normal appearance. Again demonstrated is a right adnexal cyst containing a rounded mural nodule. The cyst measures 10.0 x 8.5 x 6.6 cm, previously 10.9 x 10.4 x 8.9 cm. The mural nodule currently measures 2.0 x 1.8 x 1.2 cm and has no internal blood flow  with color Doppler. This previously measured 3.4 x 2.8 x 2.3 cm with adjacent soft tissue thickening, not currently seen. Left ovary Measurements: 4.4 x 2.7 x 1.8 cm. Normal appearing left ovary. Again demonstrated is a solid left adnexal mass with internal blood flow with color Doppler. This measures 7.7 x 6.1 x 5.6 cm, previously 8.3 x 8.1 x 7.2 cm. Pulsed Doppler evaluation of both ovaries demonstrates normal low-resistance arterial and venous waveforms on the left. Arterial waveforms could not be established on the right with pulsed Doppler and strong venous waveforms could not be established with pulsed Doppler on the right. There was a small amount of internal blood flow within what appears to be the right ovary with color Doppler. Other findings No abnormal free fluid. IMPRESSION: 1. Poorly visualized right ovary with findings suggesting compromised internal blood flow within the ovary. There is no ovarian enlargement to indicate torsion. The vascular pedicle could be compromised by a large adjacent uterine fibroid. It is difficult to determine if this is actually the ovary, since the ovary was not visualized as a separate structure on the previous ultrasound examinations. 2. Three large uterine fibroids. 3. Interval decrease in size of a solid left adnexal mass, currently measuring 7.7 x 6.1 x 5.6 cm. This could potentially represent a 4th exophytic uterine fibroid. 4. Interval decrease in size of a complex cystic right ovarian mass, currently measuring 10.0 x 8.5 x 6.6 cm. As previously suggested, the anatomy of the uterus and ovaries could be better defined with elective pre and postcontrast magnetic resonance imaging of the pelvis. Electronically Signed: By: Claudie Revering M.D. On: 12/31/2017 13:55   US Pelvis Complete  Addendum Date: 12/31/2017   ADDENDUM REPORT:  12/31/2017 14:20 ADDENDUM: The patient had a pelvis MRI dated 12/06/2014. This confirmed that the solid left adnexal mass is an exophytic  fibroid. Since the complex right ovarian cyst is smaller and the mural nodule did not have internal blood flow with color Doppler, this is compatible with a benign cystic lesion. Therefore, the patient does not need MRI of the pelvis at this time. Electronically Signed   By: Claudie Revering M.D.   On: 12/31/2017 14:20   Result Date: 12/31/2017 CLINICAL DATA:  Sudden onset right lower quadrant abdominal pain. History of ovarian cyst. EXAM: TRANSABDOMINAL AND TRANSVAGINAL ULTRASOUND OF PELVIS DOPPLER ULTRASOUND OF OVARIES TECHNIQUE: Both transabdominal and transvaginal ultrasound examinations of the pelvis were performed. Transabdominal technique was performed for global imaging of the pelvis including uterus, ovaries, adnexal regions, and pelvic cul-de-sac. It was necessary to proceed with endovaginal exam following the transabdominal exam to visualize the uterus and ovaries in better detail. Color and duplex Doppler ultrasound was utilized to evaluate blood flow to the ovaries. COMPARISON:  Pelvic ultrasound dated 12/05/2014 and 11/06/2014. Pelvis CT dated 12/05/2014. FINDINGS: Uterus Measurements: 13.9 x 8.0 x 8.0 cm. Three oval, solid masses in the myometrium. One measures 7.8 x 7.1 x 6.1 cm. Another measures 5.4 x 4.0 x 4.0 cm. The 3rd measures 3.4 x 2.5 x 2.2 cm. Endometrium Thickness: 8.9 mm.  No focal abnormality visualized. Right ovary Measurements: Poorly visualized, measuring approximately 3.1 x 2.4 x 1.8 cm. 2.2 cm exophytic or paraovarian cyst with minimal internal echoes and no internal blood flow with color Doppler. Otherwise, the right ovary has a normal appearance. Again demonstrated is a right adnexal cyst containing a rounded mural nodule. The cyst measures 10.0 x 8.5 x 6.6 cm, previously 10.9 x 10.4 x 8.9 cm. The mural nodule currently measures 2.0 x 1.8 x 1.2 cm and has no internal blood flow with color Doppler. This previously measured 3.4 x 2.8 x 2.3 cm with adjacent soft tissue thickening, not  currently seen. Left ovary Measurements: 4.4 x 2.7 x 1.8 cm. Normal appearing left ovary. Again demonstrated is a solid left adnexal mass with internal blood flow with color Doppler. This measures 7.7 x 6.1 x 5.6 cm, previously 8.3 x 8.1 x 7.2 cm. Pulsed Doppler evaluation of both ovaries demonstrates normal low-resistance arterial and venous waveforms on the left. Arterial waveforms could not be established on the right with pulsed Doppler and strong venous waveforms could not be established with pulsed Doppler on the right. There was a small amount of internal blood flow within what appears to be the right ovary with color Doppler. Other findings No abnormal free fluid. IMPRESSION: 1. Poorly visualized right ovary with findings suggesting compromised internal blood flow within the ovary. There is no ovarian enlargement to indicate torsion. The vascular pedicle could be compromised by a large adjacent uterine fibroid. It is difficult to determine if this is actually the ovary, since the ovary was not visualized as a separate structure on the previous ultrasound examinations. 2. Three large uterine fibroids. 3. Interval decrease in size of a solid left adnexal mass, currently measuring 7.7 x 6.1 x 5.6 cm. This could potentially represent a 4th exophytic uterine fibroid. 4. Interval decrease in size of a complex cystic right ovarian mass, currently measuring 10.0 x 8.5 x 6.6 cm. As previously suggested, the anatomy of the uterus and ovaries could be better defined with elective pre and postcontrast magnetic resonance imaging of the pelvis. Electronically Signed: By: Percell Locus.D.  On: 12/31/2017 13:55   [place] Art/ven Flow Abd Pelv Doppler  Addendum Date: 12/31/2017   ADDENDUM REPORT: 12/31/2017 14:20 ADDENDUM: The patient had a pelvis MRI dated 12/06/2014. This confirmed that the solid left adnexal mass is an exophytic fibroid. Since the complex right ovarian cyst is smaller and the mural nodule did not have  internal blood flow with color Doppler, this is compatible with a benign cystic lesion. Therefore, the patient does not need MRI of the pelvis at this time. Electronically Signed   By: Claudie Revering M.D.   On: 12/31/2017 14:20   Result Date: 12/31/2017 CLINICAL DATA:  Sudden onset right lower quadrant abdominal pain. History of ovarian cyst. EXAM: TRANSABDOMINAL AND TRANSVAGINAL ULTRASOUND OF PELVIS DOPPLER ULTRASOUND OF OVARIES TECHNIQUE: Both transabdominal and transvaginal ultrasound examinations of the pelvis were performed. Transabdominal technique was performed for global imaging of the pelvis including uterus, ovaries, adnexal regions, and pelvic cul-de-sac. It was necessary to proceed with endovaginal exam following the transabdominal exam to visualize the uterus and ovaries in better detail. Color and duplex Doppler ultrasound was utilized to evaluate blood flow to the ovaries. COMPARISON:  Pelvic ultrasound dated 12/05/2014 and 11/06/2014. Pelvis CT dated 12/05/2014. FINDINGS: Uterus Measurements: 13.9 x 8.0 x 8.0 cm. Three oval, solid masses in the myometrium. One measures 7.8 x 7.1 x 6.1 cm. Another measures 5.4 x 4.0 x 4.0 cm. The 3rd measures 3.4 x 2.5 x 2.2 cm. Endometrium Thickness: 8.9 mm.  No focal abnormality visualized. Right ovary Measurements: Poorly visualized, measuring approximately 3.1 x 2.4 x 1.8 cm. 2.2 cm exophytic or paraovarian cyst with minimal internal echoes and no internal blood flow with color Doppler. Otherwise, the right ovary has a normal appearance. Again demonstrated is a right adnexal cyst containing a rounded mural nodule. The cyst measures 10.0 x 8.5 x 6.6 cm, previously 10.9 x 10.4 x 8.9 cm. The mural nodule currently measures 2.0 x 1.8 x 1.2 cm and has no internal blood flow with color Doppler. This previously measured 3.4 x 2.8 x 2.3 cm with adjacent soft tissue thickening, not currently seen. Left ovary Measurements: 4.4 x 2.7 x 1.8 cm. Normal appearing left ovary.  Again demonstrated is a solid left adnexal mass with internal blood flow with color Doppler. This measures 7.7 x 6.1 x 5.6 cm, previously 8.3 x 8.1 x 7.2 cm. Pulsed Doppler evaluation of both ovaries demonstrates normal low-resistance arterial and venous waveforms on the left. Arterial waveforms could not be established on the right with pulsed Doppler and strong venous waveforms could not be established with pulsed Doppler on the right. There was a small amount of internal blood flow within what appears to be the right ovary with color Doppler. Other findings No abnormal free fluid. IMPRESSION: 1. Poorly visualized right ovary with findings suggesting compromised internal blood flow within the ovary. There is no ovarian enlargement to indicate torsion. The vascular pedicle could be compromised by a large adjacent uterine fibroid. It is difficult to determine if this is actually the ovary, since the ovary was not visualized as a separate structure on the previous ultrasound examinations. 2. Three large uterine fibroids. 3. Interval decrease in size of a solid left adnexal mass, currently measuring 7.7 x 6.1 x 5.6 cm. This could potentially represent a 4th exophytic uterine fibroid. 4. Interval decrease in size of a complex cystic right ovarian mass, currently measuring 10.0 x 8.5 x 6.6 cm. As previously suggested, the anatomy of the uterus and ovaries could be better  defined with elective pre and postcontrast magnetic resonance imaging of the pelvis. Electronically Signed: By: Claudie Revering M.D. On: 12/31/2017 13:55    Assessment/Plan: 47 yo [id number] with fibroid uterus and bilateral ovarian cyst with decreased ovarian flow in right - Admit for observation - Will keep NPO in preparation for possible laparoscopy - pain management prn - Will repeat ultrasound to assess ovarian blood flow   Bernetta Sutley 12/31/2017, 5:54 PM

## 2017-12-31 NOTE — Progress Notes (Signed)
Patient off unit in [place]

## 2017-12-31 NOTE — ED Triage Notes (Signed)
Ems called out for abdominal pain. Reports severe abdominal pain after she took her meds. Vomited slim fast she had drank this morning and meds. Reports pain in lower abdomen. Started new med 2 days ago Repaglinide. CBG 127 per EMS

## 2017-12-31 NOTE — ED Notes (Signed)
Report given to [facility] at this time.   

## 2018-01-01 DIAGNOSIS — N83201 Unspecified ovarian cyst, right side: Secondary | ICD-10-CM | POA: Diagnosis not present

## 2018-01-01 LAB — CBC
HEMATOCRIT: 37.2 % (ref 36.0–46.0)
Hemoglobin: 12.4 g/dL (ref 12.0–15.0)
MCH: 29.7 pg (ref 26.0–34.0)
MCHC: 33.3 g/dL (ref 30.0–36.0)
MCV: 89.2 fL (ref 78.0–100.0)
PLATELETS: 374 10*3/uL (ref 150–400)
RBC: 4.17 MIL/uL (ref 3.87–5.11)
RDW: 15.1 % (ref 11.5–15.5)
WBC: 6.7 10*3/uL (ref 4.0–10.5)

## 2018-01-01 LAB — GLUCOSE, CAPILLARY
GLUCOSE-CAPILLARY: 104 mg/dL — AB (ref 65–99)
Glucose-Capillary: 80 mg/dL (ref 65–99)

## 2018-01-01 MED ORDER — OXYCODONE-ACETAMINOPHEN 5-325 MG PO TABS: 1.0000 | ORAL_TABLET | Freq: Four times a day (QID) | ORAL | 0 refills | Status: DC | PRN

## 2018-01-01 MED ORDER — IBUPROFEN 600 MG PO TABS: 600.0000 mg | ORAL_TABLET | Freq: Four times a day (QID) | ORAL | 0 refills | Status: DC | PRN

## 2018-01-01 NOTE — Discharge Summary (Signed)
   Physician Discharge Summary  Patient ID: Joanna Newman MRN: [id number] DOB/AGE: 48-Dec-1972 48 y.o.  Admit date: 12/31/2017 Discharge date: 01/01/2018  Admission Diagnoses: concern for ovarian torsion  Discharge Diagnoses:  Active Problems:   Ovarian torsion   Ovarian cyst pain with ovarian cyst  Discharged Condition: good  Hospital Course: Please see HPI dated 12/31/2017 for full details. Briefly, this is a 47 y.o. [id number] female admitted for concern for ovarian torsion. Her imaging was concerning however on presentation, her description of pain was not consistent with torsion picture. Her pain continued to be constant over her hospital course and mild in nature, requiring only occasional pain meds. I reviewed that her mild pain and description of pain is less consistent with torsion and more consistent with likely irritation of cyst and cramping secondary to menses. Reviewed that torsion not ruled out and she should represent to hospital if pain worsens. Will have her follow up with GYN this week, reviewed importance of following up with Dr. Serita Grit office for planned cyst removal. She verbalizes understanding of the above.   Physical exam  Vitals:   01/01/18 0501 01/01/18 0502 01/01/18 0700 01/01/18 1637  BP:  125/90 126/67 129/77  Pulse:  66 70 69  Resp:  16 18 18   Temp:   98.6 F (37 C) 97.8 F (36.6 C)  TempSrc:   Oral Oral  SpO2: 99%  96% 98%  Weight:       General: alert, cooperative and no distress, patient changing positions and moving around bed with no apparent distress or pain Abd: mildly tender RLQ Ext: no evidence of DVT  Labs: Lab Results  Component Value Date   WBC 6.7 01/01/2018   HGB 12.4 01/01/2018   HCT 37.2 01/01/2018   MCV 89.2 01/01/2018   PLT 374 01/01/2018   CMP Latest Ref Rng & Units 12/31/2017  Glucose 65 - 99 mg/dL 94  BUN 6 - 20 mg/dL 12  Creatinine 0.44 - 1.00 mg/dL 0.81  Sodium 135 - 145 mmol/L 138  Potassium 3.5 - 5.1 mmol/L 3.8    Chloride 101 - 111 mmol/L 104  CO2 22 - 32 mmol/L 24  Calcium 8.9 - 10.3 mg/dL 9.1  Total Protein 6.5 - 8.1 g/dL 7.7  Total Bilirubin 0.3 - 1.2 mg/dL 0.5  Alkaline Phos 38 - 126 U/L 73  AST 15 - 41 U/L 17  ALT 14 - 54 U/L 20      Disposition: Discharge disposition: 01-Home or Self Care       Discharge Instructions    Call MD for:  persistant nausea and vomiting   Complete by:  As directed    Call MD for:  severe uncontrolled pain   Complete by:  As directed    Diet - low sodium heart healthy   Complete by:  As directed    Increase activity slowly   Complete by:  As directed      An After Visit Summary was printed and given to the patient.  Follow-up [facility] for [facility]. Call.   Specialty:  Obstetrics and Gynecology Why:  please call to make follow up appointment if you do not hear from the office on Monday Contact information: [place] [phone]          Signed: Sloan Leiter 01/01/2018, 5:42 PM

## 2018-01-01 NOTE — Progress Notes (Signed)
All discharge teaching completed with the patient. All printed discharge instructions and prescription given and explained to the patient. No questions or concerns voiced. Patient verbalizes an understanding of all instructions.

## 2018-01-01 NOTE — Plan of Care (Signed)
Patient is ambulating in room at this time. Denies any pain or discomfort. Blood glucose check this morning is 104.

## 2018-01-01 NOTE — Progress Notes (Signed)
    Gynecology Progress Note  Admission Date: 12/31/2017 Current Date: 01/01/2018 9:35 AM  Joanna Newman is a 47 y.o. [id number] HD#2 admitted for abdominal pain with concern for ovarian torsion.    Subjective:  Patient reports pain in RLQ, started yesterday which coincided with her period starting yesterday as well. Denies having pain like this before. Some nausea, was reporting sweating and vomiting yesterday, still having some nausea this am. Reports pain is 8/10. Also with pain in epigastric area. Has been followed by Gyn Onc for 10 cm complex right ovarian cyst but plan per patient was no surgery, has not been in to see them in some time due to insurance issues.   Per RN, patient reporting no pain and requesting to eat.  Objective:   Vitals:   01/01/18 0430 01/01/18 0501 01/01/18 0502 01/01/18 0700  BP:   125/90 126/67  Pulse:   66 70  Resp:   16 18  Temp: 98.4 F (36.9 C)   98.6 F (37 C)  TempSrc: Oral   Oral  SpO2:  99%  96%  Weight:        No intake/output data recorded. No intake or output data in the 24 hours ending 01/01/18 0935   Physical exam: BP 126/67 (BP Location: Left Arm)   Pulse 70   Temp 98.6 F (37 C) (Oral)   Resp 18   Wt 269 lb (122 kg)   LMP 10/02/2017   SpO2 96%   BMI 47.65 kg/m  CONSTITUTIONAL: Well-developed, well-nourished female in no acute distress.  HENT:  Normocephalic, atraumatic, External right and left ear normal. Oropharynx is clear and moist EYES: Conjunctivae and EOM are normal. Pupils are equal, round, and reactive to light. No scleral icterus.  NECK: Normal range of motion, supple, no masses.  Normal thyroid.  SKIN: Skin is warm and dry. No rash noted. Not diaphoretic. No erythema. No pallor. NEUROLOGIC: Alert and oriented to person, place, and time. Normal reflexes, muscle tone coordination. No cranial nerve deficit noted. PSYCHIATRIC: Normal mood and affect. Normal behavior. Normal judgment and thought  content. CARDIOVASCULAR: Normal heart rate noted, regular rhythm RESPIRATORY: Clear to auscultation bilaterally. Effort and breath sounds normal, no problems with respiration noted. ABDOMEN: Soft, mildly tender RLQ and epigastric area, no distention noted.  PELVIC: deferred MUSCULOSKELETAL: Normal range of motion. No tenderness.  No cyanosis, clubbing, or edema.     Labs  Recent Labs  Lab 12/31/17 1143 01/01/18 0535  WBC 7.3 6.7  HGB 13.3 12.4  HCT 40.8 37.2  PLT 391 374     Assessment & Plan:   Patient is 47 y.o. [id number] HD#2 admitted for abdominal pain in the setting of 10 cm right ovarian cyst. She is reporting significant pain out of proportion with exam and amount of pain medication she is receiving. Concern for torsion on arrival, however patient describes consistent pain that coincided with start of menses (last period in 09/2017). Suspect pain secondary to menses rather than ovarian torsion. Reviewed plan for continued monitoring today and possible dc home if no worsening. Okay to eat.     Feliz Beam, M.D. Attending [place], [facility] for [employer], [place]

## 2018-01-03 ENCOUNTER — Telehealth: Payer: Self-pay | Admitting: General Practice

## 2018-01-03 ENCOUNTER — Encounter: Payer: Self-pay | Admitting: General Practice

## 2018-01-03 ENCOUNTER — Telehealth: Payer: Self-pay

## 2018-01-03 NOTE — Telephone Encounter (Signed)
Left message on VM for patient to give our office a call in regards to appointment scheduled for 01/05/18.  Letter will be mailed to patient.

## 2018-01-03 NOTE — Telephone Encounter (Signed)
Gave Ms Joanna Newman a follow up appointment with Dr. Denman Newman for 01-26-18 as requested by Dr. Rosana Newman in patient's discharge summary on 12-31-17. Pt has a f/u appointment on 01-05-18 with Dr. Hulan Newman to f/u abdominal pain as noted in Discharge notes by Dr. Rosana Newman. Ms Joanna Newman is to call Dr. Serita Newman office if She does not need to see Dr. Denman Newman after follow up with Dr. Hulan Newman. Pt verbalized understanding.  Left a Message regarding above appointments with Joanna Newman in Referrals with [place]. [phone].

## 2018-01-05 ENCOUNTER — Encounter: Payer: Self-pay | Admitting: Obstetrics & Gynecology

## 2018-01-05 ENCOUNTER — Ambulatory Visit: Payer: BLUE CROSS/BLUE SHIELD | Admitting: Obstetrics & Gynecology

## 2018-01-05 ENCOUNTER — Encounter: Payer: Self-pay | Admitting: General Practice

## 2018-01-05 ENCOUNTER — Telehealth: Payer: Self-pay | Admitting: *Deleted

## 2018-01-05 VITALS — BP 170/90 | HR 59 | Wt 274.6 lb

## 2018-01-05 DIAGNOSIS — N83201 Unspecified ovarian cyst, right side: Secondary | ICD-10-CM

## 2018-01-05 DIAGNOSIS — N83202 Unspecified ovarian cyst, left side: Secondary | ICD-10-CM

## 2018-01-05 MED ORDER — OXYCODONE-ACETAMINOPHEN 5-325 MG PO TABS: 1.0000 | ORAL_TABLET | Freq: Four times a day (QID) | ORAL | 0 refills | Status: DC | PRN

## 2018-01-05 MED ORDER — IBUPROFEN 800 MG PO TABS: 800.0000 mg | ORAL_TABLET | Freq: Three times a day (TID) | ORAL | 1 refills | Status: DC | PRN

## 2018-01-05 NOTE — Telephone Encounter (Signed)
Attempted to call the patient to move up her April 18th. Unable to reach the patient, left a message to call our office back.

## 2018-01-05 NOTE — Progress Notes (Signed)
Patient ID: Joanna Newman, female   DOB: Feb 22, 1971, 47 y.o.   MRN: [id number]  No chief complaint on file.   HPI Joanna Newman is a 47 y.o. female. P3 here for a follow up visit after being discharged 4 days ago from [facility] for possible ovarian torsion. She has a long history of bilateral ovarian cysts.She is scheduled to see Dr. Denman George in April but she is taking percocet every 6 hours, can't work. She would like more pain meds and to have her appt with the gyn onc moved up/sooner.  HPI  Past Medical History:  Diagnosis Date  . Anxiety   . Asthma   . Diabetes mellitus without complication ([place])   . Fibroids   . Hypertension   . Insomnia   . Obesity   . Substance abuse ([place])    cocaine, marijuana and alcohol abuse. quit end of 2012    Past Surgical History:  Procedure Laterality Date  . CHOLECYSTECTOMY    . KNEE SURGERY    . TUBAL LIGATION      Family History  Problem Relation Age of Onset  . Hypertension Mother   . Cancer Mother 87       colon  . Heart disease Father 45       heart failure  . Diabetes Father   . Heart failure Father   . Diabetes Maternal Grandmother   . CVA Maternal Grandmother 66  . Diabetes Paternal Grandmother   . CVA Maternal Aunt 38       Aneurysm    Social History Social History   Tobacco Use  . Smoking status: Former Smoker    Packs/day: 0.25    Years: 15.00    Pack years: 3.75    Types: Cigarettes    Last attempt to quit: 03/11/2012    Years since quitting: 5.8  . Smokeless tobacco: Never Used  Substance Use Topics  . Alcohol use: Yes    Comment: once a month  . Drug use: Yes    Types: Marijuana    Comment:  history of substance abuse; last marijuana use about 1 week ago    Allergies  Allergen Reactions  . Lisinopril Swelling    Current Outpatient Medications  Medication Sig Dispense Refill  . amLODipine (NORVASC) 2.5 MG tablet Take 1 tablet (2.5 mg total) by mouth daily. 30 tablet 0  . aspirin EC 325 MG EC tablet  Take 1 tablet (325 mg total) by mouth daily.    [name] doxepin (SINEQUAN) 10 MG capsule Take 10 mg by mouth at bedtime as needed (For sleep.).     [name] ibuprofen (ADVIL,MOTRIN) 600 MG tablet Take 1 tablet (600 mg total) by mouth every 6 (six) hours as needed. 30 tablet 0  . metFORMIN (GLUCOPHAGE) 500 MG tablet Take 1 tablet (500 mg total) by mouth 2 (two) times daily with a meal. (Patient taking differently: Take 1,000 mg by mouth 2 (two) times daily with a meal. ) 60 tablet 2  . Omega-3 Fatty Acids (FISH OIL PO) Take 1 capsule by mouth daily.    [name] oxyCODONE-acetaminophen (PERCOCET/ROXICET) 5-325 MG tablet Take 1 tablet by mouth every 6 (six) hours as needed. 15 tablet 0  . repaglinide (PRANDIN) 0.5 MG tablet Take 0.5 mg by mouth 3 (three) times daily before meals.    [name] UNABLE TO FIND 1,000 mg. Med Name: Black Seed Oil    . atorvastatin (LIPITOR) 20 MG tablet Take 1 tablet (20 mg total) by mouth daily  at 6 PM. (Patient not taking: Reported on 01/05/2018) 30 tablet 0  . Flaxseed, Linseed, (FLAXSEED OIL) 1000 MG CAPS Take 1 capsule by mouth daily.     No current facility-administered medications for this visit.     Review of Systems Review of Systems  Blood pressure (!) 170/90, pulse (!) 59, weight 274 lb 9.6 oz (124.6 kg), last menstrual period 12/31/2017.  Physical Exam Physical Exam Breathing, conversing, and ambulating normally Well nourished, well hydrated Black female, no apparent distress Abd- obese, benign, some tenderness  Data Reviewed  IMPRESSION: 1. Redemonstration of a right adnexal predominantly cystic mass measuring 10.9 x 7.9 x 10.1 cm with small intramural nodule that demonstrates no vascularity. This is previously noted on 2016 cross-sectional studies and is unchanged size wise. 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 Otherwise, both ovaries demonstrate physiologic sized follicles. 2. Redemonstration of 3  subserosal leiomyomas, the largest approximately 6.8 cm on the right.  Assessment    Pelvic pain with bilateral ovarian cysts    Plan    I will give her a note to be out of work until her appt with gyn onc I will give her another 30 percocets I have left a message with gyn onc to call me back so that I can possibly get her appt moved up.       Adamaris King C Damont Balles 01/05/2018, 11:06 AM

## 2018-01-07 ENCOUNTER — Encounter (HOSPITAL_COMMUNITY): Payer: Self-pay | Admitting: Emergency Medicine

## 2018-01-07 ENCOUNTER — Emergency Department (HOSPITAL_COMMUNITY)
Admission: EM | Admit: 2018-01-07 | Discharge: 2018-01-08 | Disposition: A | Discharge status: Home / Self Care | Payer: BLUE CROSS/BLUE SHIELD | Attending: Emergency Medicine | Admitting: Emergency Medicine

## 2018-01-07 ENCOUNTER — Other Ambulatory Visit: Payer: Self-pay

## 2018-01-07 DIAGNOSIS — N8301 Follicular cyst of right ovary: Secondary | ICD-10-CM | POA: Diagnosis not present

## 2018-01-07 DIAGNOSIS — R1031 Right lower quadrant pain: Secondary | ICD-10-CM | POA: Diagnosis not present

## 2018-01-07 DIAGNOSIS — R109 Unspecified abdominal pain: Secondary | ICD-10-CM | POA: Diagnosis not present

## 2018-01-07 DIAGNOSIS — Z87891 Personal history of nicotine dependence: Secondary | ICD-10-CM | POA: Insufficient documentation

## 2018-01-07 DIAGNOSIS — Z8673 Personal history of transient ischemic attack (TIA), and cerebral infarction without residual deficits: Secondary | ICD-10-CM | POA: Insufficient documentation

## 2018-01-07 DIAGNOSIS — Z7982 Long term (current) use of aspirin: Secondary | ICD-10-CM | POA: Insufficient documentation

## 2018-01-07 DIAGNOSIS — N83201 Unspecified ovarian cyst, right side: Secondary | ICD-10-CM

## 2018-01-07 DIAGNOSIS — Z7984 Long term (current) use of oral hypoglycemic drugs: Secondary | ICD-10-CM | POA: Diagnosis not present

## 2018-01-07 DIAGNOSIS — I10 Essential (primary) hypertension: Secondary | ICD-10-CM | POA: Insufficient documentation

## 2018-01-07 DIAGNOSIS — R1111 Vomiting without nausea: Secondary | ICD-10-CM | POA: Diagnosis not present

## 2018-01-07 DIAGNOSIS — E119 Type 2 diabetes mellitus without complications: Secondary | ICD-10-CM | POA: Diagnosis not present

## 2018-01-07 DIAGNOSIS — J45909 Unspecified asthma, uncomplicated: Secondary | ICD-10-CM | POA: Insufficient documentation

## 2018-01-07 MED ORDER — HYDROMORPHONE HCL 2 MG/ML IJ SOLN
2.0000 mg | Freq: Once | INTRAMUSCULAR | Status: AC
  Administered 2018-01-07: 2 mg via INTRAMUSCULAR
  Filled 2018-01-07: qty 1

## 2018-01-07 NOTE — ED Notes (Signed)
Pt would not remain still for this nurse to attempt and IV insertion. Pt states "I just hurt too bad I can't stay still."

## 2018-01-07 NOTE — ED Provider Notes (Signed)
[facility] EMERGENCY DEPARTMENT Provider Note   CSN: [id number] Arrival date & time: 01/07/18  2307     History   Chief Complaint Chief Complaint  Patient presents with  . Abdominal Pain    HPI Joanna Newman is a 47 y.o. female.  This patient is a 47 year old female with past medical history of anxiety, asthma, fibroids, obesity, and recently diagnosed right ovarian cyst.  This was treated at [facility] during her recent admission.  She was ruled out for torsion.  She is scheduled to follow-up on this coming Friday.  She was told if her pain became worse, she was to go to [facility].  The patient came here instead.  The history is provided by the patient.  Abdominal Pain   This is a recurrent problem. The current episode started 1 to 2 hours ago. The problem occurs constantly. The problem has been rapidly worsening. The pain is located in the RLQ. The quality of the pain is cramping and sharp. Pertinent negatives include anorexia, fever, hematochezia, vomiting, constipation and dysuria. Nothing aggravates the symptoms. Nothing relieves the symptoms.    Past Medical History:  Diagnosis Date  . Anxiety   . Asthma   . Diabetes mellitus without complication ([place])   . Fibroids   . Hypertension   . Insomnia   . Obesity   . Substance abuse ([place])    cocaine, marijuana and alcohol abuse. quit end of 2012    Patient Active Problem List   Diagnosis Date Noted  . Ovarian torsion 12/31/2017  . Ovarian cyst 12/31/2017  . Impingement of right ankle joint 04/05/2017  . Acute CVA (cerebrovascular accident) ([place]) 08/06/2016  . Paresthesia 08/04/2016  . Hyponatremia 08/04/2016  . Essential hypertension 12/25/2014  . Insomnia 12/25/2014  . Asthma, chronic 12/25/2014  . Morbid obesity ([place]) 12/25/2014  . Bilateral ovarian cysts 12/16/2014  . Diabetes ([place]) 12/16/2014  . Morbid obesity with BMI of 50.0-59.9, adult ([place]) 05/20/2012  . Vaginal itching 05/20/2012     Past Surgical History:  Procedure Laterality Date  . CHOLECYSTECTOMY    . KNEE SURGERY    . TUBAL LIGATION       OB History    Gravida  5   Para  3   Term  3   Preterm      AB  2   Living  3     SAB      TAB  2   Ectopic      Multiple      Live Births               Home Medications    Prior to Admission medications   Medication Sig Start Date End Date Taking? Authorizing Provider  amLODipine (NORVASC) 2.5 MG tablet Take 1 tablet (2.5 mg total) by mouth daily. 08/06/16   Verlee Monte, MD  aspirin EC 325 MG EC tablet Take 1 tablet (325 mg total) by mouth daily. 08/06/16   Verlee Monte, MD  atorvastatin (LIPITOR) 20 MG tablet Take 1 tablet (20 mg total) by mouth daily at 6 PM. Patient not taking: Reported on 01/05/2018 08/06/16   Verlee Monte, MD  doxepin (SINEQUAN) 10 MG capsule Take 10 mg by mouth at bedtime as needed (For sleep.).     [provider]  Flaxseed, Linseed, (FLAXSEED OIL) 1000 MG CAPS Take 1 capsule by mouth daily.    [provider]  ibuprofen (ADVIL,MOTRIN) 600 MG tablet Take 1 tablet (600 mg total)  by mouth every 6 (six) hours as needed. 01/01/18   Sloan Leiter, MD  ibuprofen (ADVIL,MOTRIN) 800 MG tablet Take 1 tablet (800 mg total) by mouth every 8 (eight) hours as needed. 01/05/18   Emily Filbert, MD  metFORMIN (GLUCOPHAGE) 500 MG tablet Take 1 tablet (500 mg total) by mouth 2 (two) times daily with a meal. Patient taking differently: Take 1,000 mg by mouth 2 (two) times daily with a meal.  08/06/16   Verlee Monte, MD  Omega-3 Fatty Acids (FISH OIL PO) Take 1 capsule by mouth daily.    [provider]  oxyCODONE-acetaminophen (PERCOCET/ROXICET) 5-325 MG tablet Take 1 tablet by mouth every 6 (six) hours as needed. 01/01/18   Sloan Leiter, MD  oxyCODONE-acetaminophen (PERCOCET/ROXICET) 5-325 MG tablet Take 1 tablet by mouth every 6 (six) hours as needed. 01/05/18   Emily Filbert, MD  repaglinide (PRANDIN) 0.5 MG  tablet Take 0.5 mg by mouth 3 (three) times daily before meals.    [provider]  UNABLE TO FIND 1,000 mg. Med Name: [facility] Seed Oil    [provider]    Family History Family History  Problem Relation Age of Onset  . Hypertension Mother   . Cancer Mother 57       colon  . Heart disease Father 42       heart failure  . Diabetes Father   . Heart failure Father   . Diabetes Maternal Grandmother   . CVA Maternal Grandmother 28  . Diabetes Paternal Grandmother   . CVA Maternal Aunt 38       Aneurysm    Social History Social History   Tobacco Use  . Smoking status: Former Smoker    Packs/day: 0.25    Years: 15.00    Pack years: 3.75    Types: Cigarettes    Last attempt to quit: 03/11/2012    Years since quitting: 5.8  . Smokeless tobacco: Never Used  Substance Use Topics  . Alcohol use: Yes    Comment: once a month  . Drug use: Yes    Types: Marijuana    Comment:  history of substance abuse; last marijuana use about 1 week ago     Allergies   Lisinopril   Review of Systems Review of Systems  Constitutional: Negative for fever.  Gastrointestinal: Positive for abdominal pain. Negative for anorexia, constipation, hematochezia and vomiting.  Genitourinary: Negative for dysuria.  All other systems reviewed and are negative.    Physical Exam Updated Vital Signs Pulse 73   Temp 98.6 F (37 C) (Oral)   Resp 20   Wt 124.3 kg (274 lb)   LMP 12/31/2017 (Exact Date)   SpO2 100%   BMI 48.54 kg/m   Physical Exam  Constitutional: She is oriented to person, place, and time. She appears well-developed and well-nourished.  Patient is an obese female writhing, hyperventilating, and appears uncomfortable.  HENT:  Head: Normocephalic and atraumatic.  Neck: Normal range of motion. Neck supple.  Cardiovascular: Normal rate and regular rhythm. Exam reveals no gallop and no friction rub.  No murmur heard. Pulmonary/Chest: Effort normal and breath sounds  normal. No respiratory distress. She has no wheezes.  Abdominal: Soft. Bowel sounds are normal. She exhibits no distension. There is tenderness.  Abdomen is tender in all 4 quadrants.  Exam is difficult secondary to patient being uncooperative and flailing around on the exam stretcher.  Any attempt to palpate the abdomen results and the patient pushing  my hands away.  Musculoskeletal: Normal range of motion.  Neurological: She is alert and oriented to person, place, and time.  Skin: Skin is warm and dry. She is not diaphoretic.  Nursing note and vitals reviewed.    ED Treatments / Results  Labs (all labs ordered are listed, but only abnormal results are displayed) Labs Reviewed  BASIC METABOLIC PANEL  CBC WITH DIFFERENTIAL/PLATELET    EKG None  Radiology No results found.  Procedures Procedures (including critical care time)  Medications Ordered in ED Medications  HYDROmorphone (DILAUDID) injection 2 mg (has no administration in time range)     Initial Impression / Assessment and Plan / ED Course  I have reviewed the triage vital signs and the nursing notes.  Pertinent labs & imaging results that were available during my care of the patient were reviewed by me and considered in my medical decision making (see chart for details).  Patient with known ovarian cyst presenting with an exacerbation of pain.  She presented here this evening very dramatically with writhing and thrashing about the exam room.  She was given IM Dilaudid and seemed to help.  She went for a noncontrast CT scan to rule out hemorrhage or other complication.  This was performed and revealed a slight increase in the size of the cyst.  She has a follow-up appointment this week with GYN to discuss her options.  I see no indication for emergent transfer.  She will be discharged, to follow-up with GYN if not improving.  The patient has been told to go to [facility] if she worsens.  Final Clinical  Impressions(s) / ED Diagnoses   Final diagnoses:  None    ED Discharge Orders    None       Veryl Speak, MD 01/08/18 [phone]

## 2018-01-07 NOTE — ED Triage Notes (Signed)
[facility] Pt is C/O increased abdominal pain and vomiting that started around 1000 this morning.

## 2018-01-08 ENCOUNTER — Emergency Department (HOSPITAL_COMMUNITY): Payer: BLUE CROSS/BLUE SHIELD

## 2018-01-08 DIAGNOSIS — R1111 Vomiting without nausea: Secondary | ICD-10-CM | POA: Diagnosis not present

## 2018-01-08 LAB — CBC WITH DIFFERENTIAL/PLATELET
BASOS PCT: 1 %
Basophils Absolute: 0.1 10*3/uL (ref 0.0–0.1)
EOS ABS: 0 10*3/uL (ref 0.0–0.7)
EOS PCT: 0 %
HCT: 39.8 % (ref 36.0–46.0)
HEMOGLOBIN: 13.1 g/dL (ref 12.0–15.0)
LYMPHS ABS: 3.4 10*3/uL (ref 0.7–4.0)
Lymphocytes Relative: 36 %
MCH: 29.2 pg (ref 26.0–34.0)
MCHC: 32.9 g/dL (ref 30.0–36.0)
MCV: 88.6 fL (ref 78.0–100.0)
MONOS PCT: 6 %
Monocytes Absolute: 0.5 10*3/uL (ref 0.1–1.0)
NEUTROS PCT: 57 %
Neutro Abs: 5.5 10*3/uL (ref 1.7–7.7)
PLATELETS: 472 10*3/uL — AB (ref 150–400)
RBC: 4.49 MIL/uL (ref 3.87–5.11)
RDW: 14.8 % (ref 11.5–15.5)
WBC: 9.5 10*3/uL (ref 4.0–10.5)

## 2018-01-08 LAB — BASIC METABOLIC PANEL
Anion gap: 14 (ref 5–15)
BUN: 12 mg/dL (ref 6–20)
CALCIUM: 9.2 mg/dL (ref 8.9–10.3)
CO2: 20 mmol/L — ABNORMAL LOW (ref 22–32)
CREATININE: 0.84 mg/dL (ref 0.44–1.00)
Chloride: 105 mmol/L (ref 101–111)
GFR calc non Af Amer: >60 mL/min (ref >60)
Glucose, Bld: 141 mg/dL — ABNORMAL HIGH (ref 65–99)
Potassium: 3.5 mmol/L (ref 3.5–5.1)
SODIUM: 139 mmol/L (ref 135–145)

## 2018-01-08 MED ORDER — HYDROMORPHONE HCL 2 MG/ML IJ SOLN
2.0000 mg | Freq: Once | INTRAMUSCULAR | Status: AC
  Administered 2018-01-08: 2 mg via INTRAMUSCULAR
  Filled 2018-01-08: qty 1

## 2018-01-08 NOTE — Discharge Instructions (Addendum)
Continue your medications as previously prescribed.  Follow-up at [facility] if your symptoms worsen or change.

## 2018-01-13 ENCOUNTER — Encounter: Payer: Self-pay | Admitting: Gynecologic Oncology

## 2018-01-13 ENCOUNTER — Inpatient Hospital Stay: Payer: BLUE CROSS/BLUE SHIELD

## 2018-01-13 ENCOUNTER — Inpatient Hospital Stay: Payer: BLUE CROSS/BLUE SHIELD | Attending: Gynecologic Oncology | Admitting: Gynecologic Oncology

## 2018-01-13 VITALS — BP 149/82 | HR 66 | Temp 98.0°F | Resp 18 | Ht 64.5 in | Wt 270.0 lb

## 2018-01-13 DIAGNOSIS — Z794 Long term (current) use of insulin: Secondary | ICD-10-CM

## 2018-01-13 DIAGNOSIS — N83209 Unspecified ovarian cyst, unspecified side: Secondary | ICD-10-CM | POA: Insufficient documentation

## 2018-01-13 DIAGNOSIS — Z8673 Personal history of transient ischemic attack (TIA), and cerebral infarction without residual deficits: Secondary | ICD-10-CM | POA: Diagnosis not present

## 2018-01-13 DIAGNOSIS — I10 Essential (primary) hypertension: Secondary | ICD-10-CM | POA: Insufficient documentation

## 2018-01-13 DIAGNOSIS — Z79899 Other long term (current) drug therapy: Secondary | ICD-10-CM | POA: Insufficient documentation

## 2018-01-13 DIAGNOSIS — Z7984 Long term (current) use of oral hypoglycemic drugs: Secondary | ICD-10-CM

## 2018-01-13 DIAGNOSIS — Z7982 Long term (current) use of aspirin: Secondary | ICD-10-CM | POA: Diagnosis not present

## 2018-01-13 DIAGNOSIS — E1159 Type 2 diabetes mellitus with other circulatory complications: Secondary | ICD-10-CM

## 2018-01-13 DIAGNOSIS — E1165 Type 2 diabetes mellitus with hyperglycemia: Secondary | ICD-10-CM | POA: Insufficient documentation

## 2018-01-13 DIAGNOSIS — Z87891 Personal history of nicotine dependence: Secondary | ICD-10-CM | POA: Diagnosis not present

## 2018-01-13 LAB — HEMOGLOBIN A1C
Hgb A1c MFr Bld: 5.9 % — ABNORMAL HIGH (ref 4.8–5.6)
Mean Plasma Glucose: 122.63 mg/dL

## 2018-01-13 MED ORDER — TRAMADOL HCL 50 MG PO TABS: 50.0000 mg | ORAL_TABLET | Freq: Four times a day (QID) | ORAL | 0 refills | Status: DC | PRN

## 2018-01-13 NOTE — Progress Notes (Signed)
New Patient Note: Gyn-Onc  Patient was referred by Dr. Ihor Dow for the evaluation of Joanna Newman 47 y.o. female with class III morbid obesity, poorly controlled diabetes mellitus and bilateral ovarian cysts  CC:  Chief Complaint  Patient presents with  . Cyst of ovary, unspecified laterality    Assessment/Plan:  Joanna. Joanna Newman  is a 47 y.o.  year old with a right ovarian cyst which is fairly stable from 2016 but now more symptomatic with pain.  This mass may not be ovarian (given the March US findings) but instead be a para-ovarian cyst or a cyst associated with a non-gyn structure.  She also has a left ovary which may appear somewhat complex on imaging. She has a fibroid uterus.   I am recommending robotic assisted RSO, with possible hysterectomy and LSO (if the left ovary appears abnormal we will perform hysterectomy and BSO). We will obtain frozen section and stage if malignancy is found. If both ovaries are normal intraoperatively, we will restrict surgery to resection of the mass.  Her diabetes is reportedly better controlled. We will check HbA1C today. If so (<8), we will proceed with surgery.  I discussed surgical risks (which are higher for Joanna Newman given her morbid obesity, including  bleeding, infection, damage to internal organs (such as bladder,ureters, bowels), blood clot, reoperation and rehospitalization.  We will hold ASA for 10 days preop.   HPI: Joanna Newman is a 47 year old gravida 5 para 3 who is seen in consultation at the request of Dr Ihor Dow for bilateral complex ovarian cysts. The patient has cluster morbid obesity with a BMI greater than 55 kg/m. She has a extremely poorly controlled diabetes mellitus with a recent random blood glucose of greater than 500, and an HbA1c drawn in late January 2016 but was 14.3%. She has untreated essential hypertension.  The patient does not have a primary care doctor and seeks consultations in  the emergency room or urgent care clinics for exacerbations of symptoms such as abdominal pelvic pain. She reports having bilateral intermittent pelvic pain "for years". This became slightly worse since August 2016. She was seen in the emergency room at [facility] in [place] on 11/06/2014 at which time she was presenting with abnormal uterine bleeding and underwent an ultrasound that showed a uterus measuring 11.4 x 5.7 x 6.5 cm with fibroids, a 4 mm endometrial thickness, a right ovary measuring 11.7 x 8.6 x 10.2 cm with a cystic mass and a 2.7 cm mural nodule. The left ovary measures 11.4 x 7.2 x 9.7 cm and included a solid-appearing mass with peripheral vascularity. There was no free fluid. This ultrasound scan was repeated on February25th 2016 and impressions were that the ovarian masses were stable with the right measuring approximately 11 cm in greatest dimension the left measuring 8cm in greatest dimension. Uterine fibroids was suspected.   A CT of the abdomen and pelvis was performed [facility] after emergency room visit for pain on February 25th 2016 and this confirmed a large para umbilical hernia with fat extending through a 6.7 cm defect. There was a hypodense homogeneous mass in the left lateral abdomen at the level of the umbilicus umbilicus measuring 6.8 x 5.7 x 7.8 cm. This did not appear to be related to the adnexa. There was 6 separate adnexal masses noted.   An MR of the pelvis was performed on 05/06/2015 and revealed an enlarged fibroid uterus measuring 10.2 x 9.1 x 13.6 cm. Within the left adnexa  there was a cyst measuring 6.5 x 6.5 cm. There is no solid nodular component internal septation identified. The impressions were large right adnexal cyst. There is a small left adnexal cyst also noted. It is likely that some of the other masses noted on prior scans were in fact fibroids that were better delineated by this MR.   Her recheck of HbA1c was 14% in March, 2016. She  began seeing a diabetes educator in April 2016 and altered her diet.  Interval History: she developed a CVA in 2017 and was put on aspirin therapy. She began receiving insulin for her diabetes and her HbA1c obtained improved control. Dr Alroy Dust manages her diabetes from [place].   She reports somewhat irregular but not heavy periods.  She began experiencing increased RLQ pains and nausea and emesis in the early months of 2019.  She was seen in ED's and imaging performed including a CT on 01/08/18 which showed a complex cystic right adnexal mass has slightly enlarged in size and measures 13 x 8.4 x 10.1 cm. The solid component in the superior aspect of the lesion has also enlarged in size. Slow growing ovarian malignancy is of consideration. Surgical consultation is recommended. Large leiomyomatous uterus. Left adnexal mass versus exophytic fibroid. Simple cystic structure in the left retroperitoneum has decreased in size, etiology uncertain.  An [place] (preceding) on 01/01/18 showed a uterus with measurements: 12.9 cm craniocaudad by 11.7 cm transverse. Redemonstration of previously noted leiomyomata, subserosal on the right measuring up to 6.8 cm, subserosal on the left measuring up to 2.7 cm and lower uterine on the right measuring up to 6.6 cm allowing for operator dependent imaging differences. Endometrium Thickness: 7.5 mm.  No focal abnormality visualized. Right ovary Measurements: 4 x 2.3 x 3.8 cm. Small physiologic sized anechoic follicle is noted within. Left ovary Measurements: 6.4 x 2.7 x 5.4 cm and contains an anechoic follicle measuring 2.4 x 2.1 cm. Normal appearance/no adnexal mass. Other findings: Redemonstration of a circumscribed partially solid partially cystic right adnexal mass to the right of the umbilicus, previously documented on 2016 cross-sectional imaging exams currently measuring 10.9 x 7.9 x 10.1 cm with avascular echogenic intraluminal focus seen within as  before.   Current Meds:  Outpatient Encounter Medications as of 01/13/2018  Medication Sig  . amLODipine (NORVASC) 2.5 MG tablet Take 1 tablet (2.5 mg total) by mouth daily.  [name] aspirin EC 325 MG EC tablet Take 1 tablet (325 mg total) by mouth daily.  [name] atorvastatin (LIPITOR) 20 MG tablet Take 1 tablet (20 mg total) by mouth daily at 6 PM.  . doxepin (SINEQUAN) 10 MG capsule Take 10 mg by mouth at bedtime as needed (For sleep.).   [name] ibuprofen (ADVIL,MOTRIN) 800 MG tablet Take 1 tablet (800 mg total) by mouth every 8 (eight) hours as needed.  . metFORMIN (GLUCOPHAGE) 500 MG tablet Take 1 tablet (500 mg total) by mouth 2 (two) times daily with a meal. (Patient taking differently: Take 1,000 mg by mouth 2 (two) times daily with a meal. )  . Omega-3 Fatty Acids (FISH OIL PO) Take 1 capsule by mouth daily.  [name] OVER THE COUNTER MEDICATION Fish Oil daily  . oxyCODONE-acetaminophen (PERCOCET/ROXICET) 5-325 MG tablet Take 1 tablet by mouth every 6 (six) hours as needed.  [name] oxyCODONE-acetaminophen (PERCOCET/ROXICET) 5-325 MG tablet Take 1 tablet by mouth every 6 (six) hours as needed.  . repaglinide (PRANDIN) 0.5 MG tablet Take 0.5 mg by mouth 3 (three) times daily before meals.  [name]  UNABLE TO FIND 1,000 mg. Med Name: Black Seed Oil  . traMADol (ULTRAM) 50 MG tablet Take 1 tablet (50 mg total) by mouth every 6 (six) hours as needed.  . [DISCONTINUED] Flaxseed, Linseed, (FLAXSEED OIL) 1000 MG CAPS Take 1 capsule by mouth daily.  . [DISCONTINUED] ibuprofen (ADVIL,MOTRIN) 600 MG tablet Take 1 tablet (600 mg total) by mouth every 6 (six) hours as needed.   No facility-administered encounter medications on file as of 01/13/2018.     Allergy:  Allergies  Allergen Reactions  . Lisinopril Swelling    Social Hx:   Social History   Socioeconomic History  . Marital status: Married    Spouse name: Not on file  . Number of children: Not on file  . Years of education: Not on file  . Highest education level:  Not on file  Occupational History  . Occupation: event specialist  Social Needs  . Financial resource strain: Not on file  . Food insecurity:    Worry: Not on file    Inability: Not on file  . Transportation needs:    Medical: Not on file    Non-medical: Not on file  Tobacco Use  . Smoking status: Former Smoker    Packs/day: 0.25    Years: 15.00    Pack years: 3.75    Types: Cigarettes    Last attempt to quit: 03/11/2012    Years since quitting: 5.8  . Smokeless tobacco: Never Used  Substance and Sexual Activity  . Alcohol use: Yes    Comment: once a month  . Drug use: Yes    Types: Marijuana    Comment:  history of substance abuse; last marijuana use about 1 week ago  . Sexual activity: Yes  Lifestyle  . Physical activity:    Days per week: Not on file    Minutes per session: Not on file  . Stress: Not on file  Relationships  . Social connections:    Talks on phone: Not on file    Gets together: Not on file    Attends religious service: Not on file    Active member of club or organization: Not on file    Attends meetings of clubs or organizations: Not on file    Relationship status: Not on file  . Intimate partner violence:    Fear of current or ex partner: Not on file    Emotionally abused: Not on file    Physically abused: Not on file    Forced sexual activity: Not on file  Other Topics Concern  . 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 CHOLECYSTECTOMY    . KNEE SURGERY    . TUBAL LIGATION      Past Medical Hx:  Past Medical History:  Diagnosis Date  . Anxiety   . Asthma   . Diabetes mellitus without complication ([place])   . Fibroids   . Hypertension   . Insomnia   . Obesity   . Substance abuse ([place])    cocaine, marijuana and alcohol abuse. quit end of 2012    Past Gynecological History:  Boone Master x2,  [id number]  Patient's last menstrual period was 12/31/2017 (exact date).  Family Hx:  Family History  Problem Relation Age of Onset  . Hypertension Mother   . Cancer Mother 6  colon  . Heart disease Father 30       heart failure  . Diabetes Father   . Heart failure Father   . Diabetes Maternal Grandmother   . CVA Maternal Grandmother 19  . Diabetes Paternal Grandmother   . CVA Maternal Aunt 38       Aneurysm    Review of Systems:  Constitutional  Feels well,   ENT Normal appearing ears and nares bilaterally Skin/Breast  No rash, sores, jaundice, itching, dryness Cardiovascular  No chest pain, shortness of breath, or edema  Pulmonary  No cough or wheeze.  Gastro Intestinal  No nausea, vomitting, or diarrhoea. No bright red blood per rectum, no abdominal pain, change in bowel movement, or constipation.  Genito Urinary  No frequency, urgency, dysuria, Musculo Skeletal  No myalgia, arthralgia, joint swelling or pain  Neurologic  No weakness, numbness, change in gait,  Psychology  No depression, anxiety, insomnia.   Vitals:  Blood pressure (!) 149/82, pulse 66, temperature 98 F (36.7 C), temperature source Oral, resp. rate 18, height 5' 4.5" (1.638 m), weight 270 lb (122.5 kg), last menstrual period 12/31/2017, SpO2 100 %.  Physical Exam: WD in NAD Neck  deferred Lymph Node Survey deferred Cardiovascular  deferred Lungs  deferred Skin  No rash/lesions/breakdown  Psychiatry  Alert and oriented to person, place, and time  Abdomen  Morbidly obese. Unable to feel mass in abdomen.  Back deferred Genito Urinary  Normal external female genitalia Normal vaginal Cervix, palpably and visibly normal Uterus bulky, globular but with good mobility and access via sidewalls No discretely palpable ovarian cysts.  Rectal  deferred Extremities  No bilateral cyanosis, clubbing or edema.   Thereasa Solo, MD   01/13/2018, 6:12 PM

## 2018-01-13 NOTE — Patient Instructions (Signed)
Preparing for your Surgery  Plan for surgery on Feb 14, 2018 with Dr. Everitt Amber at [place] will be scheduled for a robotic assisted total hysterectomy, bilateral salpingo-oophorectomy, possible staging, poss mini-laparotomy.  We will check your hemoglobin A1C today.  Plan on stopping your fish oil and aspirin 10 days pre-op.  Pre-operative Testing -You will receive a phone call from presurgical testing at [facility] to arrange for a pre-operative testing appointment before your surgery.  This appointment normally occurs one to two weeks before your scheduled surgery.   -Bring your insurance card, copy of an advanced directive if applicable, medication list  -At that visit, you will be asked to sign a consent for a possible blood transfusion in case a transfusion becomes necessary during surgery.  The need for a blood transfusion is rare but having consent is a necessary part of your care.     -You should not be taking blood thinners or aspirin at least ten days prior to surgery unless instructed by your surgeon.  Day Before Surgery at [facility] will be asked to take in a light diet the day before surgery.  Avoid carbonated beverages.  You will be advised to have nothing to eat or drink after midnight the evening before.    Eat a light diet the day before surgery.  Examples including soups, broths, toast, yogurt, mashed potatoes.  Things to avoid include carbonated beverages (fizzy beverages), raw fruits and raw vegetables, or beans.   If your bowels are filled with gas, your surgeon will have difficulty visualizing your pelvic organs which increases your surgical risks.  Your role in recovery Your role is to become active as soon as directed by your doctor, while still giving yourself time to heal.  Rest when you feel tired. You will be asked to do the following in order to speed your recovery:  - Cough and breathe deeply. This helps toclear and  expand your lungs and can prevent pneumonia. You may be given a spirometer to practice deep breathing. A staff member will show you how to use the spirometer. - Do mild physical activity. Walking or moving your legs help your circulation and body functions return to normal. A staff member will help you when you try to walk and will provide you with simple exercises. Do not try to get up or walk alone the first time. - Actively manage your pain. Managing your pain lets you move in comfort. We will ask you to rate your pain on a scale of zero to 10. It is your responsibility to tell your doctor or nurse where and how much you hurt so your pain can be treated.  Special Considerations -If you are diabetic, you may be placed on insulin after surgery to have closer control over your blood sugars to promote healing and recovery.  This does not mean that you will be discharged on insulin.  If applicable, your oral antidiabetics will be resumed when you are tolerating a solid diet.  -Your final pathology results from surgery should be available by the Friday after surgery and the results will be relayed to you when available.  -Dr. Lahoma Crocker is the Surgeon that assists your GYN Oncologist with surgery.  The next day after your surgery you will either see your GYN Oncologist or Dr. Lahoma Crocker.   Blood Transfusion Information WHAT IS A BLOOD TRANSFUSION? A transfusion is the replacement of blood or some of its parts. Blood is  made up of multiple cells which provide different functions.  Red blood cells carry oxygen and are used for blood loss replacement.  White blood cells fight against infection.  Platelets control bleeding.  Plasma helps clot blood.  Other blood products are available for specialized needs, such as hemophilia or other clotting disorders. BEFORE THE TRANSFUSION  Who gives blood for transfusions?   You may be able to donate blood to be used at a later date on  yourself (autologous donation).  Relatives can be asked to donate blood. This is generally not any safer than if you have received blood from a stranger. The same precautions are taken to ensure safety when a relative's blood is donated.  Healthy volunteers who are fully evaluated to make sure their blood is safe. This is blood bank blood. Transfusion therapy is the safest it has ever been in the practice of medicine. Before blood is taken from a donor, a complete history is taken to make sure that person has no history of diseases nor engages in risky social behavior (examples are intravenous drug use or sexual activity with multiple partners). The donor's travel history is screened to minimize risk of transmitting infections, such as malaria. The donated blood is tested for signs of infectious diseases, such as HIV and hepatitis. The blood is then tested to be sure it is compatible with you in order to minimize the chance of a transfusion reaction. If you or a relative donates blood, this is often done in anticipation of surgery and is not appropriate for emergency situations. It takes many days to process the donated blood. RISKS AND COMPLICATIONS Although transfusion therapy is very safe and saves many lives, the main dangers of transfusion include:   Getting an infectious disease.  Developing a transfusion reaction. This is an allergic reaction to something in the blood you were given. Every precaution is taken to prevent this. The decision to have a blood transfusion has been considered carefully by your caregiver before blood is given. Blood is not given unless the benefits outweigh the risks.

## 2018-01-16 DIAGNOSIS — I639 Cerebral infarction, unspecified: Secondary | ICD-10-CM | POA: Diagnosis not present

## 2018-01-16 DIAGNOSIS — E119 Type 2 diabetes mellitus without complications: Secondary | ICD-10-CM | POA: Diagnosis not present

## 2018-01-16 DIAGNOSIS — I10 Essential (primary) hypertension: Secondary | ICD-10-CM | POA: Diagnosis not present

## 2018-01-17 ENCOUNTER — Telehealth: Payer: Self-pay

## 2018-01-17 NOTE — Telephone Encounter (Signed)
Told Joanna Newman that her Hgb A1c was good at 5.9 per Joylene John, NP. Gave patient the office fax number to give to her children to sent their FMLA papers  to be filled out to help er out post operatively, especially with some residual effect of a CVA with mobility issues.

## 2018-01-26 ENCOUNTER — Ambulatory Visit: Payer: BLUE CROSS/BLUE SHIELD | Admitting: Gynecologic Oncology

## 2018-02-01 ENCOUNTER — Other Ambulatory Visit: Payer: Self-pay | Admitting: Gynecologic Oncology

## 2018-02-02 ENCOUNTER — Other Ambulatory Visit: Payer: Self-pay | Admitting: Gynecologic Oncology

## 2018-02-02 DIAGNOSIS — R1030 Lower abdominal pain, unspecified: Secondary | ICD-10-CM

## 2018-02-02 MED ORDER — TRAMADOL HCL 50 MG PO TABS: 50.0000 mg | ORAL_TABLET | Freq: Four times a day (QID) | ORAL | 0 refills | Status: DC | PRN

## 2018-02-06 ENCOUNTER — Other Ambulatory Visit: Payer: Self-pay

## 2018-02-06 DIAGNOSIS — R1030 Lower abdominal pain, unspecified: Secondary | ICD-10-CM

## 2018-02-06 MED ORDER — TRAMADOL HCL 50 MG PO TABS: 50.0000 mg | ORAL_TABLET | Freq: Four times a day (QID) | ORAL | 0 refills | Status: DC | PRN

## 2018-02-06 NOTE — Telephone Encounter (Signed)
Tramadol prescription sent to [facility] on 02-02-2018 for [place] Location then received new prescription request form sent to our office from [facility].  Called pt and she said she no longer gets prescriptions at [facility] in [place], that [facility] is closer to her. I let her know we would get prescription sent to [facility] location for her.  Called [place] location and they can not transfer the prescription from the other store.  [place] and they confirmed prescription has not be filled and they can not transfer this prescription to the [place] location.  Notified M. Cross NP and Printed prescription for Tramadol and faxed to [place] at [place] location.

## 2018-02-08 ENCOUNTER — Encounter (HOSPITAL_COMMUNITY): Payer: Self-pay

## 2018-02-08 NOTE — Pre-Procedure Instructions (Signed)
Hgb A1c (5.9) 01/13/2018 in epic.  Last office visit note 01/16/2018 Dr. Alroy Dust in hard chart.

## 2018-02-08 NOTE — Patient Instructions (Signed)
Your procedure is scheduled on: Tuesday, Feb 14, 2018   Surgery Time:  7:30AM-10:30AM   Report to [facility]  Entrance    Report to admitting at 5:30 AM   Call this number if you have problems the morning of surgery [phone]   Eat a light diet the day before surgery. Examples including soups, broths, toast, yogurt, mashed potatoes. Things to avoid include carbonated beverages (fizzy beverages), raw fruits and raw vegetables, or beans.    If your bowels are filled with gas, your surgeon will have difficulty visualizing your pelvic organs which increases your surgical risks.   Do not eat food or drink liquids :After Midnight.   Do NOT smoke after Midnight   Complete one Ensure drink the morning of surgery by  4:30AM the day of surgery.   Take these medicines the morning of surgery with A SIP OF WATER: Amlodipine   DO NOT TAKE ANY DIABETIC MEDICATIONS DAY OF YOUR SURGERY                               You may not have any metal on your body including hair pins, jewelry, and body piercings             Do not wear make-up, lotions, powders, perfumes/cologne, or deodorant             Do not wear nail polish.  Do not shave  48 hours prior to surgery.               Do not bring valuables to the hospital. [place].   Contacts, dentures or bridgework may not be worn into surgery.   Leave suitcase in the car. After surgery it may be brought to your room.   Special Instructions: Bring a copy of your healthcare power of attorney and living will documents         the day of surgery if you haven't scanned them in before.              Please read over the following fact sheets you were given:  [facility] - Preparing for Surgery Before surgery, you can play an important role.  Because skin is not sterile, your skin needs to be as free of germs as possible.  You can reduce the number of germs on your skin by washing  with CHG (chlorahexidine gluconate) soap before surgery.  CHG is an antiseptic cleaner which kills germs and bonds with the skin to continue killing germs even after washing. Please DO NOT use if you have an allergy to CHG or antibacterial soaps.  If your skin becomes reddened/irritated stop using the CHG and inform your nurse when you arrive at Short Stay. Do not shave (including legs and underarms) for at least 48 hours prior to the first CHG shower.  You may shave your face/neck.  Please follow these instructions carefully:  1.  Shower with CHG Soap the night before surgery and the  morning of surgery.  2.  If you choose to wash your hair, wash your hair first as usual with your normal  shampoo.  3.  After you shampoo, rinse your hair and body thoroughly to remove the shampoo.  4.  Use CHG as you would any other liquid soap.  You can apply chg directly to the skin and wash.  Gently with a scrungie or clean washcloth.  5.  Apply the CHG Soap to your body ONLY FROM THE NECK DOWN.   Do   not use on face/ open                           Wound or open sores. Avoid contact with eyes, ears mouth and   genitals (private parts).                       Wash face,  Genitals (private parts) with your normal soap.             6.  Wash thoroughly, paying special attention to the area where your    surgery  will be performed.  7.  Thoroughly rinse your body with warm water from the neck down.  8.  DO NOT shower/wash with your normal soap after using and rinsing off the CHG Soap.                9.  Pat yourself dry with a clean towel.            10.  Wear clean pajamas.            11.  Place clean sheets on your bed the night of your first shower and do not  sleep with pets. Day of Surgery : Do not apply any lotions/deodorants the morning of surgery.  Please wear clean clothes to the hospital/surgery center.  FAILURE TO FOLLOW THESE INSTRUCTIONS MAY RESULT IN THE CANCELLATION OF YOUR  SURGERY  PATIENT SIGNATURE_________________________________  NURSE SIGNATURE__________________________________  ________________________________________________________________________   Adam Phenix  An incentive spirometer is a tool that can help keep your lungs clear and active. This tool measures how well you are filling your lungs with each breath. Taking long deep breaths may help reverse or decrease the chance of developing breathing (pulmonary) problems (especially infection) following:  A long period of time when you are unable to move or be active. BEFORE THE PROCEDURE   If the spirometer includes an indicator to show your best effort, your nurse or respiratory therapist will set it to a desired goal.  If possible, sit up straight or lean slightly forward. Try not to slouch.  Hold the incentive spirometer in an upright position. INSTRUCTIONS FOR USE  1. Sit on the edge of your bed if possible, or sit up as far as you can in bed or on a chair. 2. Hold the incentive spirometer in an upright position. 3. Breathe out normally. 4. Place the mouthpiece in your mouth and seal your lips tightly around it. 5. Breathe in slowly and as deeply as possible, raising the piston or the ball toward the top of the column. 6. Hold your breath for 3-5 seconds or for as long as possible. Allow the piston or ball to fall to the bottom of the column. 7. Remove the mouthpiece from your mouth and breathe out normally. 8. Rest for a few seconds and repeat Steps 1 through 7 at least 10 times every 1-2 hours when you are awake. Take your time and take a few normal breaths between deep breaths. 9. The spirometer may include an indicator to show your best effort. Use the indicator as a goal to work toward during each repetition.  10. After each set of 10 deep breaths, practice coughing to be sure your lungs are clear. If you have an incision (the cut made at the time of surgery), support your  incision when coughing by placing a pillow or rolled up towels firmly against it. Once you are able to get out of bed, walk around indoors and cough well. You may stop using the incentive spirometer when instructed by your caregiver.  RISKS AND COMPLICATIONS  Take your time so you do not get dizzy or light-headed.  If you are in pain, you may need to take or ask for pain medication before doing incentive spirometry. It is harder to take a deep breath if you are having pain. AFTER USE  Rest and breathe slowly and easily.  It can be helpful to keep track of a log of your progress. Your caregiver can provide you with a simple table to help with this. If you are using the spirometer at home, follow these instructions: [place] IF:   You are having difficultly using the spirometer.  You have trouble using the spirometer as often as instructed.  Your pain medication is not giving enough relief while using the spirometer.  You develop fever of 100.5 F (38.1 C) or higher. SEEK IMMEDIATE MEDICAL CARE IF:   You cough up bloody sputum that had not been present before.  You develop fever of 102 F (38.9 C) or greater.  You develop worsening pain at or near the incision site. MAKE SURE YOU:   Understand these instructions.  Will watch your condition.  Will get help right away if you are not doing well or get worse. Document Released: 02/07/2007 Document Revised: 12/20/2011 Document Reviewed: 04/10/2007 Exit[employer] Patient Information 2014 [employer], [place].   ________________________________________________________________________  WHAT IS A BLOOD TRANSFUSION? Blood Transfusion Information  A transfusion is the replacement of blood or some of its parts. Blood is made up of multiple cells which provide different functions.  Red blood cells carry oxygen and are used for blood loss replacement.  White blood cells fight against infection.  Platelets control bleeding.  Plasma  helps clot blood.  Other blood products are available for specialized needs, such as hemophilia or other clotting disorders. BEFORE THE TRANSFUSION  Who gives blood for transfusions?   Healthy volunteers who are fully evaluated to make sure their blood is safe. This is blood bank blood. Transfusion therapy is the safest it has ever been in the practice of medicine. Before blood is taken from a donor, a complete history is taken to make sure that person has no history of diseases nor engages in risky social behavior (examples are intravenous drug use or sexual activity with multiple partners). The donor's travel history is screened to minimize risk of transmitting infections, such as malaria. The donated blood is tested for signs of infectious diseases, such as HIV and hepatitis. The blood is then tested to be sure it is compatible with you in order to minimize the chance of a transfusion reaction. If you or a relative donates blood, this is often done in anticipation of surgery and is not appropriate for emergency situations. It takes many days to process the donated blood. RISKS AND COMPLICATIONS Although transfusion therapy is very safe and saves many lives, the main dangers of transfusion include:   Getting an infectious disease.  Developing a transfusion reaction. This is an allergic reaction to something in the blood you were given. Every precaution is taken to prevent this. The  decision to have a blood transfusion has been considered carefully by your caregiver before blood is given. Blood is not given unless the benefits outweigh the risks. AFTER THE TRANSFUSION  Right after receiving a blood transfusion, you will usually feel much better and more energetic. This is especially true if your red blood cells have gotten low (anemic). The transfusion raises the level of the red blood cells which carry oxygen, and this usually causes an energy increase.  The nurse administering the transfusion  will monitor you carefully for complications. HOME CARE INSTRUCTIONS  No special instructions are needed after a transfusion. You may find your energy is better. Speak with your caregiver about any limitations on activity for underlying diseases you may have. SEEK MEDICAL CARE IF:   Your condition is not improving after your transfusion.  You develop redness or irritation at the intravenous (IV) site. SEEK IMMEDIATE MEDICAL CARE IF:  Any of the following symptoms occur over the next 12 hours:  Shaking chills.  You have a temperature by mouth above 102 F (38.9 C), not controlled by medicine.  Chest, back, or muscle pain.  People around you feel you are not acting correctly or are confused.  Shortness of breath or difficulty breathing.  Dizziness and fainting.  You get a rash or develop hives.  You have a decrease in urine output.  Your urine turns a dark color or changes to pink, red, or brown. Any of the following symptoms occur over the next 10 days:  You have a temperature by mouth above 102 F (38.9 C), not controlled by medicine.  Shortness of breath.  Weakness after normal activity.  The white part of the eye turns yellow (jaundice).  You have a decrease in the amount of urine or are urinating less often.  Your urine turns a dark color or changes to pink, red, or brown. Document Released: 09/24/2000 Document Revised: 12/20/2011 Document Reviewed: 05/13/2008 [facility] Patient Information 2014 [place], [place].  _______________________________________________________________________

## 2018-02-09 ENCOUNTER — Encounter (HOSPITAL_COMMUNITY)
Admission: RE | Admit: 2018-02-09 | Discharge: 2018-02-09 | Disposition: A | Discharge status: Home / Self Care | Payer: BLUE CROSS/BLUE SHIELD | Source: Ambulatory Visit | Attending: Gynecologic Oncology | Admitting: Gynecologic Oncology

## 2018-02-09 ENCOUNTER — Other Ambulatory Visit: Payer: Self-pay

## 2018-02-09 ENCOUNTER — Encounter (HOSPITAL_COMMUNITY): Payer: Self-pay

## 2018-02-09 DIAGNOSIS — Z01810 Encounter for preprocedural cardiovascular examination: Secondary | ICD-10-CM | POA: Insufficient documentation

## 2018-02-09 DIAGNOSIS — N83201 Unspecified ovarian cyst, right side: Secondary | ICD-10-CM | POA: Diagnosis not present

## 2018-02-09 DIAGNOSIS — Z01812 Encounter for preprocedural laboratory examination: Secondary | ICD-10-CM | POA: Diagnosis not present

## 2018-02-09 HISTORY — DX: Hyperlipidemia, unspecified: E78.5

## 2018-02-09 HISTORY — DX: Unspecified ovarian cyst, right side: N83.201

## 2018-02-09 HISTORY — DX: Cerebral infarction, unspecified: I63.9

## 2018-02-09 HISTORY — DX: Unspecified osteoarthritis, unspecified site: M19.90

## 2018-02-09 HISTORY — DX: Sleep apnea, unspecified: G47.30

## 2018-02-09 HISTORY — DX: Pain in right foot: M79.671

## 2018-02-09 HISTORY — DX: Unspecified ovarian cyst, left side: N83.202

## 2018-02-09 HISTORY — DX: Gastro-esophageal reflux disease without esophagitis: K21.9

## 2018-02-09 HISTORY — DX: Epidermal cyst: L72.0

## 2018-02-09 HISTORY — DX: Cardiomegaly: I51.7

## 2018-02-09 LAB — COMPREHENSIVE METABOLIC PANEL
ALT: 17 U/L (ref 14–54)
AST: 17 U/L (ref 15–41)
Albumin: 3.9 g/dL (ref 3.5–5.0)
Alkaline Phosphatase: 66 U/L (ref 38–126)
Anion gap: 9 (ref 5–15)
BILIRUBIN TOTAL: 0.6 mg/dL (ref 0.3–1.2)
BUN: 15 mg/dL (ref 6–20)
CALCIUM: 9 mg/dL (ref 8.9–10.3)
CO2: 23 mmol/L (ref 22–32)
Chloride: 108 mmol/L (ref 101–111)
Creatinine, Ser: 0.85 mg/dL (ref 0.44–1.00)
Glucose, Bld: 95 mg/dL (ref 65–99)
Potassium: 4.2 mmol/L (ref 3.5–5.1)
Sodium: 140 mmol/L (ref 135–145)
TOTAL PROTEIN: 6.9 g/dL (ref 6.5–8.1)

## 2018-02-09 LAB — GLUCOSE, CAPILLARY: Glucose-Capillary: 108 mg/dL — ABNORMAL HIGH (ref 65–99)

## 2018-02-09 LAB — CBC
HEMATOCRIT: 39.2 % (ref 36.0–46.0)
Hemoglobin: 12.5 g/dL (ref 12.0–15.0)
MCH: 29 pg (ref 26.0–34.0)
MCHC: 31.9 g/dL (ref 30.0–36.0)
MCV: 91 fL (ref 78.0–100.0)
Platelets: 400 10*3/uL (ref 150–400)
RBC: 4.31 MIL/uL (ref 3.87–5.11)
RDW: 14.2 % (ref 11.5–15.5)
WBC: 8 10*3/uL (ref 4.0–10.5)

## 2018-02-09 LAB — URINALYSIS, ROUTINE W REFLEX MICROSCOPIC
Bilirubin Urine: NEGATIVE
GLUCOSE, UA: NEGATIVE mg/dL
Hgb urine dipstick: NEGATIVE
Ketones, ur: NEGATIVE mg/dL
LEUKOCYTES UA: NEGATIVE
Nitrite: NEGATIVE
PROTEIN: NEGATIVE mg/dL
Specific Gravity, Urine: 1.026 (ref 1.005–1.030)
pH: 5 (ref 5.0–8.0)

## 2018-02-09 LAB — ABO/RH: ABO/RH(D): A POS

## 2018-02-09 LAB — PREGNANCY, URINE: PREG TEST UR: NEGATIVE

## 2018-02-13 ENCOUNTER — Telehealth: Payer: Self-pay

## 2018-02-13 MED ORDER — DEXTROSE 5 % IV SOLN
3.0000 g | INTRAVENOUS | Status: AC
  Administered 2018-02-14: 3 g via INTRAVENOUS
  Filled 2018-02-13: qty 3

## 2018-02-13 NOTE — Anesthesia Preprocedure Evaluation (Addendum)
Anesthesia Evaluation  Patient identified by MRN, date of birth, ID band Patient awake    Reviewed: Allergy & Precautions, NPO status , Patient's Chart, lab work & pertinent test results  Airway Mallampati: III  TM Distance: >3 FB Neck ROM: Full    Dental  (+) Dental Advisory Given   Pulmonary asthma , sleep apnea , former smoker,    breath sounds clear to auscultation       Cardiovascular Exercise Tolerance: Good hypertension, Pt. on medications  Rhythm:Regular Rate:Normal  '17 TTE - Moderate LVH. EF 60% to 65%. Trivial MR, TR. A patent foramen ovale cannot be excluded.PASP 20 mmHg   Neuro/Psych Anxiety Depression InsomniaCVA, No Residual Symptoms    GI/Hepatic GERD  Controlled,(+)     substance abuse  alcohol use, cocaine use and marijuana use,   Endo/Other  diabetes, Type 2, Oral Hypoglycemic AgentsMorbid obesity  Renal/GU negative Renal ROS  negative genitourinary   Musculoskeletal  (+) Arthritis ,   Abdominal (+) + obese,   Peds  Hematology negative hematology ROS (+)   Anesthesia Other Findings   Reproductive/Obstetrics                            Anesthesia Physical Anesthesia Plan  ASA: III  Anesthesia Plan: General   Post-op Pain Management:    Induction: Intravenous  PONV Risk Score and Plan: Treatment may vary due to age or medical condition and Ondansetron  Airway Management Planned: Oral ETT  Additional Equipment: None  Intra-op Plan:   Post-operative Plan: Extubation in OR  Informed Consent: I have reviewed the patients History and Physical, chart, labs and discussed the procedure including the risks, benefits and alternatives for the proposed anesthesia with the patient or authorized representative who has indicated his/her understanding and acceptance.   Dental advisory given  Plan Discussed with: CRNA and Anesthesiologist  Anesthesia Plan Comments: (Will  place nasopharyngeal airway prior to emergence for use in immediate post-extubation period.)        Anesthesia Quick Evaluation

## 2018-02-13 NOTE — Telephone Encounter (Signed)
Returned pt's call regarding wanting to know our fax number for FMLA papers or she may drop them off. Our fax number given- no other needs per pt at this time.

## 2018-02-14 ENCOUNTER — Ambulatory Visit (HOSPITAL_COMMUNITY)
Admission: RE | Admit: 2018-02-14 | Discharge: 2018-02-15 | Disposition: A | Discharge status: Home / Self Care | Payer: BLUE CROSS/BLUE SHIELD | Source: Ambulatory Visit | Attending: Gynecologic Oncology | Admitting: Gynecologic Oncology

## 2018-02-14 ENCOUNTER — Ambulatory Visit (HOSPITAL_COMMUNITY): Payer: BLUE CROSS/BLUE SHIELD | Admitting: Anesthesiology

## 2018-02-14 ENCOUNTER — Other Ambulatory Visit: Payer: Self-pay

## 2018-02-14 ENCOUNTER — Encounter (HOSPITAL_COMMUNITY): Payer: Self-pay | Admitting: Anesthesiology

## 2018-02-14 ENCOUNTER — Encounter (HOSPITAL_COMMUNITY)
Admission: RE | Disposition: A | Discharge status: Home / Self Care | Payer: Self-pay | Source: Ambulatory Visit | Attending: Gynecologic Oncology

## 2018-02-14 DIAGNOSIS — E785 Hyperlipidemia, unspecified: Secondary | ICD-10-CM | POA: Diagnosis not present

## 2018-02-14 DIAGNOSIS — D259 Leiomyoma of uterus, unspecified: Secondary | ICD-10-CM | POA: Insufficient documentation

## 2018-02-14 DIAGNOSIS — E119 Type 2 diabetes mellitus without complications: Secondary | ICD-10-CM | POA: Diagnosis not present

## 2018-02-14 DIAGNOSIS — N83201 Unspecified ovarian cyst, right side: Secondary | ICD-10-CM | POA: Diagnosis not present

## 2018-02-14 DIAGNOSIS — D270 Benign neoplasm of right ovary: Secondary | ICD-10-CM | POA: Diagnosis not present

## 2018-02-14 DIAGNOSIS — Z79891 Long term (current) use of opiate analgesic: Secondary | ICD-10-CM | POA: Insufficient documentation

## 2018-02-14 DIAGNOSIS — K429 Umbilical hernia without obstruction or gangrene: Secondary | ICD-10-CM | POA: Insufficient documentation

## 2018-02-14 DIAGNOSIS — Z6841 Body Mass Index (BMI) 40.0 and over, adult: Secondary | ICD-10-CM | POA: Insufficient documentation

## 2018-02-14 DIAGNOSIS — Z7984 Long term (current) use of oral hypoglycemic drugs: Secondary | ICD-10-CM | POA: Diagnosis not present

## 2018-02-14 DIAGNOSIS — Z794 Long term (current) use of insulin: Secondary | ICD-10-CM | POA: Insufficient documentation

## 2018-02-14 DIAGNOSIS — Z87891 Personal history of nicotine dependence: Secondary | ICD-10-CM | POA: Diagnosis not present

## 2018-02-14 DIAGNOSIS — N736 Female pelvic peritoneal adhesions (postinfective): Secondary | ICD-10-CM | POA: Diagnosis not present

## 2018-02-14 DIAGNOSIS — I9742 Intraoperative hemorrhage and hematoma of a circulatory system organ or structure complicating other procedure: Secondary | ICD-10-CM | POA: Diagnosis not present

## 2018-02-14 DIAGNOSIS — G4700 Insomnia, unspecified: Secondary | ICD-10-CM | POA: Diagnosis not present

## 2018-02-14 DIAGNOSIS — N7011 Chronic salpingitis: Secondary | ICD-10-CM | POA: Diagnosis not present

## 2018-02-14 DIAGNOSIS — N83202 Unspecified ovarian cyst, left side: Secondary | ICD-10-CM

## 2018-02-14 DIAGNOSIS — Z9049 Acquired absence of other specified parts of digestive tract: Secondary | ICD-10-CM | POA: Diagnosis not present

## 2018-02-14 DIAGNOSIS — Y838 Other surgical procedures as the cause of abnormal reaction of the patient, or of later complication, without mention of misadventure at the time of the procedure: Secondary | ICD-10-CM | POA: Diagnosis not present

## 2018-02-14 DIAGNOSIS — Z7982 Long term (current) use of aspirin: Secondary | ICD-10-CM | POA: Insufficient documentation

## 2018-02-14 DIAGNOSIS — I10 Essential (primary) hypertension: Secondary | ICD-10-CM | POA: Insufficient documentation

## 2018-02-14 DIAGNOSIS — L7632 Postprocedural hematoma of skin and subcutaneous tissue following other procedure: Secondary | ICD-10-CM | POA: Insufficient documentation

## 2018-02-14 DIAGNOSIS — K660 Peritoneal adhesions (postprocedural) (postinfection): Secondary | ICD-10-CM | POA: Insufficient documentation

## 2018-02-14 DIAGNOSIS — G4730 Sleep apnea, unspecified: Secondary | ICD-10-CM | POA: Diagnosis not present

## 2018-02-14 DIAGNOSIS — R1900 Intra-abdominal and pelvic swelling, mass and lump, unspecified site: Secondary | ICD-10-CM | POA: Diagnosis present

## 2018-02-14 DIAGNOSIS — F419 Anxiety disorder, unspecified: Secondary | ICD-10-CM | POA: Diagnosis not present

## 2018-02-14 DIAGNOSIS — F329 Major depressive disorder, single episode, unspecified: Secondary | ICD-10-CM | POA: Diagnosis not present

## 2018-02-14 DIAGNOSIS — Z79899 Other long term (current) drug therapy: Secondary | ICD-10-CM | POA: Insufficient documentation

## 2018-02-14 DIAGNOSIS — Z8673 Personal history of transient ischemic attack (TIA), and cerebral infarction without residual deficits: Secondary | ICD-10-CM | POA: Insufficient documentation

## 2018-02-14 DIAGNOSIS — R8569 Abnormal cytological findings in specimens from other digestive organs and abdominal cavity: Secondary | ICD-10-CM | POA: Diagnosis not present

## 2018-02-14 DIAGNOSIS — M96841 Postprocedural hematoma of a musculoskeletal structure following other procedure: Secondary | ICD-10-CM | POA: Diagnosis not present

## 2018-02-14 HISTORY — PX: LYSIS OF ADHESION: SHX5961

## 2018-02-14 HISTORY — PX: ROBOTIC ASSISTED TOTAL HYSTERECTOMY WITH BILATERAL SALPINGO OOPHERECTOMY: SHX6086

## 2018-02-14 HISTORY — PX: LAPAROTOMY WITH STAGING: SHX5938

## 2018-02-14 LAB — GLUCOSE, CAPILLARY
GLUCOSE-CAPILLARY: 200 mg/dL — AB (ref 65–99)
GLUCOSE-CAPILLARY: 197 mg/dL — AB (ref 65–99)
Glucose-Capillary: 149 mg/dL — ABNORMAL HIGH (ref 65–99)
Glucose-Capillary: 121 mg/dL — ABNORMAL HIGH (ref 65–99)

## 2018-02-14 LAB — TYPE AND SCREEN
ABO/RH(D): A POS
Antibody Screen: NEGATIVE

## 2018-02-14 SURGERY — HYSTERECTOMY, TOTAL, ROBOT-ASSISTED, LAPAROSCOPIC, WITH BILATERAL SALPINGO-OOPHORECTOMY
Anesthesia: General

## 2018-02-14 MED ORDER — MIDAZOLAM HCL 2 MG/2ML IJ SOLN
INTRAMUSCULAR | Status: DC | PRN
  Administered 2018-02-14: 2 mg via INTRAVENOUS

## 2018-02-14 MED ORDER — ALBUMIN HUMAN 5 % IV SOLN
INTRAVENOUS | Status: AC
  Filled 2018-02-14: qty 250

## 2018-02-14 MED ORDER — ALBUMIN HUMAN 5 % IV SOLN
INTRAVENOUS | Status: DC | PRN
  Administered 2018-02-14 (×2): via INTRAVENOUS

## 2018-02-14 MED ORDER — GABAPENTIN 300 MG PO CAPS
300.0000 mg | ORAL_CAPSULE | ORAL | Status: AC
  Administered 2018-02-14: 300 mg via ORAL
  Filled 2018-02-14: qty 1

## 2018-02-14 MED ORDER — LIDOCAINE 2% (20 MG/ML) 5 ML SYRINGE
INTRAMUSCULAR | Status: DC | PRN
  Administered 2018-02-14: 1.5 mg/kg/h via INTRAVENOUS

## 2018-02-14 MED ORDER — DOXEPIN HCL 10 MG PO CAPS
10.0000 mg | ORAL_CAPSULE | Freq: Every evening | ORAL | Status: DC | PRN
  Administered 2018-02-14: 10 mg via ORAL
  Filled 2018-02-14: qty 1

## 2018-02-14 MED ORDER — INSULIN ASPART 100 UNIT/ML ~~LOC~~ SOLN
0.0000 [IU] | Freq: Three times a day (TID) | SUBCUTANEOUS | Status: DC
  Administered 2018-02-14 – 2018-02-15 (×2): 1 [IU] via SUBCUTANEOUS

## 2018-02-14 MED ORDER — DEXAMETHASONE SODIUM PHOSPHATE 10 MG/ML IJ SOLN
INTRAMUSCULAR | Status: AC
  Filled 2018-02-14: qty 1

## 2018-02-14 MED ORDER — SUGAMMADEX SODIUM 200 MG/2ML IV SOLN
INTRAVENOUS | Status: DC | PRN
  Administered 2018-02-14: 500 mg via INTRAVENOUS

## 2018-02-14 MED ORDER — OXYCODONE HCL 5 MG PO TABS: 5.0000 mg | ORAL_TABLET | Freq: Once | ORAL | Status: DC | PRN

## 2018-02-14 MED ORDER — SENNOSIDES-DOCUSATE SODIUM 8.6-50 MG PO TABS
2.0000 | ORAL_TABLET | Freq: Every day | ORAL | Status: DC
  Administered 2018-02-14: 2 via ORAL
  Filled 2018-02-14: qty 2

## 2018-02-14 MED ORDER — ENOXAPARIN SODIUM 40 MG/0.4ML ~~LOC~~ SOLN
40.0000 mg | SUBCUTANEOUS | Status: AC
  Administered 2018-02-14: 40 mg via SUBCUTANEOUS
  Filled 2018-02-14: qty 0.4

## 2018-02-14 MED ORDER — KCL IN DEXTROSE-NACL 20-5-0.45 MEQ/L-%-% IV SOLN
INTRAVENOUS | Status: DC
  Administered 2018-02-14: 15:00:00 via INTRAVENOUS
  Filled 2018-02-14 (×2): qty 1000

## 2018-02-14 MED ORDER — LACTATED RINGERS IR SOLN
Status: DC | PRN
  Administered 2018-02-14: 1000 mL

## 2018-02-14 MED ORDER — ATORVASTATIN CALCIUM 20 MG PO TABS
20.0000 mg | ORAL_TABLET | Freq: Every day | ORAL | Status: DC
  Administered 2018-02-14: 20 mg via ORAL
  Filled 2018-02-14: qty 1

## 2018-02-14 MED ORDER — FENTANYL CITRATE (PF) 100 MCG/2ML IJ SOLN
25.0000 ug | INTRAMUSCULAR | Status: DC | PRN
  Administered 2018-02-14 (×3): 50 ug via INTRAVENOUS

## 2018-02-14 MED ORDER — METFORMIN HCL 500 MG PO TABS
500.0000 mg | ORAL_TABLET | Freq: Two times a day (BID) | ORAL | Status: DC
  Administered 2018-02-14 – 2018-02-15 (×2): 500 mg via ORAL
  Filled 2018-02-14 (×2): qty 1

## 2018-02-14 MED ORDER — FENTANYL CITRATE (PF) 100 MCG/2ML IJ SOLN
INTRAMUSCULAR | Status: AC
  Filled 2018-02-14: qty 2

## 2018-02-14 MED ORDER — MIDAZOLAM HCL 2 MG/2ML IJ SOLN
INTRAMUSCULAR | Status: AC
  Filled 2018-02-14: qty 2

## 2018-02-14 MED ORDER — DEXAMETHASONE SODIUM PHOSPHATE 10 MG/ML IJ SOLN
INTRAMUSCULAR | Status: DC | PRN
  Administered 2018-02-14: 10 mg via INTRAVENOUS

## 2018-02-14 MED ORDER — ONDANSETRON HCL 4 MG PO TABS: 4.0000 mg | ORAL_TABLET | Freq: Four times a day (QID) | ORAL | Status: DC | PRN

## 2018-02-14 MED ORDER — ONDANSETRON HCL 4 MG/2ML IJ SOLN
INTRAMUSCULAR | Status: DC | PRN
  Administered 2018-02-14: 4 mg via INTRAVENOUS

## 2018-02-14 MED ORDER — ONDANSETRON HCL 4 MG/2ML IJ SOLN
INTRAMUSCULAR | Status: AC
  Filled 2018-02-14: qty 2

## 2018-02-14 MED ORDER — LIDOCAINE 2% (20 MG/ML) 5 ML SYRINGE
INTRAMUSCULAR | Status: AC
  Filled 2018-02-14: qty 5

## 2018-02-14 MED ORDER — OXYCODONE HCL 5 MG/5ML PO SOLN
5.0000 mg | Freq: Once | ORAL | Status: DC | PRN
  Filled 2018-02-14: qty 5

## 2018-02-14 MED ORDER — ROCURONIUM BROMIDE 10 MG/ML (PF) SYRINGE
PREFILLED_SYRINGE | INTRAVENOUS | Status: DC | PRN
  Administered 2018-02-14 (×2): 10 mg via INTRAVENOUS
  Administered 2018-02-14: 60 mg via INTRAVENOUS
  Administered 2018-02-14: 20 mg via INTRAVENOUS

## 2018-02-14 MED ORDER — OXYCODONE-ACETAMINOPHEN 5-325 MG PO TABS
1.0000 | ORAL_TABLET | Freq: Four times a day (QID) | ORAL | Status: DC | PRN
  Administered 2018-02-14 – 2018-02-15 (×3): 1 via ORAL
  Filled 2018-02-14 (×3): qty 1

## 2018-02-14 MED ORDER — BUPIVACAINE HCL (PF) 0.25 % IJ SOLN
INTRAMUSCULAR | Status: DC | PRN
  Administered 2018-02-14: 20 mL

## 2018-02-14 MED ORDER — DEXAMETHASONE SODIUM PHOSPHATE 4 MG/ML IJ SOLN: 4.0000 mg | INTRAMUSCULAR | Status: DC

## 2018-02-14 MED ORDER — REPAGLINIDE 0.5 MG PO TABS
0.2500 mg | ORAL_TABLET | Freq: Three times a day (TID) | ORAL | Status: DC
  Administered 2018-02-14 – 2018-02-15 (×3): 0.25 mg via ORAL
  Filled 2018-02-14 (×4): qty 1

## 2018-02-14 MED ORDER — SCOPOLAMINE 1 MG/3DAYS TD PT72
1.0000 | MEDICATED_PATCH | TRANSDERMAL | Status: DC
  Administered 2018-02-14: 1.5 mg via TRANSDERMAL
  Filled 2018-02-14: qty 1

## 2018-02-14 MED ORDER — AMLODIPINE BESYLATE 5 MG PO TABS
2.5000 mg | ORAL_TABLET | Freq: Every day | ORAL | Status: DC
  Administered 2018-02-15: 2.5 mg via ORAL
  Filled 2018-02-14: qty 1

## 2018-02-14 MED ORDER — PROMETHAZINE HCL 25 MG/ML IJ SOLN
6.2500 mg | INTRAMUSCULAR | Status: AC | PRN
  Administered 2018-02-14 (×2): 6.25 mg via INTRAVENOUS

## 2018-02-14 MED ORDER — LACTATED RINGERS IV SOLN
INTRAVENOUS | Status: DC | PRN
  Administered 2018-02-14 (×2): via INTRAVENOUS
  Administered 2018-02-14: 1000 mL

## 2018-02-14 MED ORDER — PROPOFOL 10 MG/ML IV BOLUS
INTRAVENOUS | Status: DC | PRN
  Administered 2018-02-14: 200 mg via INTRAVENOUS

## 2018-02-14 MED ORDER — LIDOCAINE 2% (20 MG/ML) 5 ML SYRINGE
INTRAMUSCULAR | Status: DC | PRN
  Administered 2018-02-14: 100 mg via INTRAVENOUS

## 2018-02-14 MED ORDER — PROMETHAZINE HCL 25 MG/ML IJ SOLN
INTRAMUSCULAR | Status: AC
  Filled 2018-02-14: qty 1

## 2018-02-14 MED ORDER — BUPIVACAINE HCL (PF) 0.25 % IJ SOLN
INTRAMUSCULAR | Status: AC
  Filled 2018-02-14: qty 30

## 2018-02-14 MED ORDER — ACETAMINOPHEN 500 MG PO TABS
1000.0000 mg | ORAL_TABLET | ORAL | Status: AC
  Administered 2018-02-14: 1000 mg via ORAL
  Filled 2018-02-14: qty 2

## 2018-02-14 MED ORDER — STERILE WATER FOR IRRIGATION IR SOLN
Status: DC | PRN
  Administered 2018-02-14: 1000 mL

## 2018-02-14 MED ORDER — IBUPROFEN 400 MG PO TABS
800.0000 mg | ORAL_TABLET | Freq: Three times a day (TID) | ORAL | Status: DC | PRN
  Administered 2018-02-15: 800 mg via ORAL
  Filled 2018-02-14: qty 2

## 2018-02-14 MED ORDER — BUPIVACAINE LIPOSOME 1.3 % IJ SUSP
20.0000 mL | Freq: Once | INTRAMUSCULAR | Status: AC
  Administered 2018-02-14: 20 mL
  Filled 2018-02-14: qty 20

## 2018-02-14 MED ORDER — CELECOXIB 200 MG PO CAPS
400.0000 mg | ORAL_CAPSULE | ORAL | Status: AC
  Administered 2018-02-14: 400 mg via ORAL
  Filled 2018-02-14: qty 2

## 2018-02-14 MED ORDER — SUGAMMADEX SODIUM 500 MG/5ML IV SOLN
INTRAVENOUS | Status: AC
  Filled 2018-02-14: qty 5

## 2018-02-14 MED ORDER — FENTANYL CITRATE (PF) 250 MCG/5ML IJ SOLN
INTRAMUSCULAR | Status: AC
  Filled 2018-02-14: qty 5

## 2018-02-14 MED ORDER — SUCCINYLCHOLINE CHLORIDE 200 MG/10ML IV SOSY
PREFILLED_SYRINGE | INTRAVENOUS | Status: AC
  Filled 2018-02-14: qty 10

## 2018-02-14 MED ORDER — PROPOFOL 10 MG/ML IV BOLUS
INTRAVENOUS | Status: AC
  Filled 2018-02-14: qty 20

## 2018-02-14 MED ORDER — ROCURONIUM BROMIDE 10 MG/ML (PF) SYRINGE
PREFILLED_SYRINGE | INTRAVENOUS | Status: AC
  Filled 2018-02-14: qty 5

## 2018-02-14 MED ORDER — ASPIRIN EC 325 MG PO TBEC
325.0000 mg | DELAYED_RELEASE_TABLET | Freq: Every day | ORAL | Status: DC
  Administered 2018-02-15: 325 mg via ORAL
  Filled 2018-02-14: qty 1

## 2018-02-14 MED ORDER — HYDROMORPHONE HCL 1 MG/ML IJ SOLN
0.2000 mg | INTRAMUSCULAR | Status: DC | PRN
  Administered 2018-02-14 (×2): 0.5 mg via INTRAVENOUS
  Filled 2018-02-14 (×2): qty 1

## 2018-02-14 MED ORDER — ONDANSETRON HCL 4 MG/2ML IJ SOLN: 4.0000 mg | Freq: Four times a day (QID) | INTRAMUSCULAR | Status: DC | PRN

## 2018-02-14 MED ORDER — FENTANYL CITRATE (PF) 250 MCG/5ML IJ SOLN
INTRAMUSCULAR | Status: DC | PRN
  Administered 2018-02-14 (×2): 50 ug via INTRAVENOUS
  Administered 2018-02-14: 100 ug via INTRAVENOUS
  Administered 2018-02-14: 50 ug via INTRAVENOUS
  Administered 2018-02-14: 100 ug via INTRAVENOUS

## 2018-02-14 MED ORDER — FENTANYL CITRATE (PF) 100 MCG/2ML IJ SOLN
INTRAMUSCULAR | Status: AC
  Filled 2018-02-14: qty 4

## 2018-02-14 MED ORDER — TRAMADOL HCL 50 MG PO TABS
50.0000 mg | ORAL_TABLET | Freq: Four times a day (QID) | ORAL | Status: DC | PRN
  Administered 2018-02-14: 50 mg via ORAL
  Filled 2018-02-14: qty 1

## 2018-02-14 SURGICAL SUPPLY — 69 items
ADH SKN CLS APL DERMABOND .7 (GAUZE/BANDAGES/DRESSINGS) ×2
AGENT HMST KT MTR STRL THRMB (HEMOSTASIS)
APL ESCP 34 STRL LF DISP (HEMOSTASIS)
APL SWBSTK 6 STRL LF DISP (MISCELLANEOUS) ×2
APPLICATOR COTTON TIP 6 STRL (MISCELLANEOUS) IMPLANT
APPLICATOR COTTON TIP 6IN STRL (MISCELLANEOUS) ×3
APPLICATOR SURGIFLO ENDO (HEMOSTASIS) IMPLANT
BAG LAPAROSCOPIC 12 15 PORT 16 (BASKET) IMPLANT
BAG RETRIEVAL 12/15 (BASKET)
BAG SPEC RTRVL LRG 6X4 10 (ENDOMECHANICALS) ×4
CHLORAPREP W/TINT 26ML (MISCELLANEOUS) ×1 IMPLANT
COVER BACK TABLE 60X90IN (DRAPES) ×3 IMPLANT
COVER TIP SHEARS 8 DVNC (MISCELLANEOUS) ×2 IMPLANT
COVER TIP SHEARS 8MM DA VINCI (MISCELLANEOUS) ×1
DERMABOND ADVANCED (GAUZE/BANDAGES/DRESSINGS) ×1
DERMABOND ADVANCED .7 DNX12 (GAUZE/BANDAGES/DRESSINGS) ×2 IMPLANT
DRAPE ARM DVNC X/XI (DISPOSABLE) ×8 IMPLANT
DRAPE COLUMN DVNC XI (DISPOSABLE) ×2 IMPLANT
DRAPE DA VINCI XI ARM (DISPOSABLE) ×4
DRAPE DA VINCI XI COLUMN (DISPOSABLE) ×1
DRAPE SHEET LG 3/4 BI-LAMINATE (DRAPES) ×3 IMPLANT
DRAPE SURG IRRIG POUCH 19X23 (DRAPES) ×3 IMPLANT
DRSG TEGADERM 2-3/8X2-3/4 SM (GAUZE/BANDAGES/DRESSINGS) ×1 IMPLANT
ELECT REM PT RETURN 15FT ADLT (MISCELLANEOUS) ×3 IMPLANT
GAUZE SPONGE 2X2 8PLY STRL LF (GAUZE/BANDAGES/DRESSINGS) IMPLANT
GLOVE BIO SURGEON STRL SZ 6 (GLOVE) ×12 IMPLANT
GLOVE BIO SURGEON STRL SZ 6.5 (GLOVE) ×6 IMPLANT
GOWN STRL REUS W/ TWL LRG LVL3 (GOWN DISPOSABLE) ×4 IMPLANT
GOWN STRL REUS W/TWL LRG LVL3 (GOWN DISPOSABLE) ×9
HOLDER FOLEY CATH W/STRAP (MISCELLANEOUS) ×2 IMPLANT
IRRIG SUCT STRYKERFLOW 2 WTIP (MISCELLANEOUS) ×3
IRRIGATION SUCT STRKRFLW 2 WTP (MISCELLANEOUS) ×2 IMPLANT
KIT PROCEDURE DA VINCI SI (MISCELLANEOUS)
KIT PROCEDURE DVNC SI (MISCELLANEOUS) IMPLANT
MANIPULATOR UTERINE 4.5 ZUMI (MISCELLANEOUS) ×3 IMPLANT
NDL SPNL 18GX3.5 QUINCKE PK (NEEDLE) ×2 IMPLANT
NEEDLE HYPO 22GX1.5 SAFETY (NEEDLE) ×2 IMPLANT
NEEDLE SPNL 18GX3.5 QUINCKE PK (NEEDLE) IMPLANT
OBTURATOR OPTICAL STANDARD 8MM (TROCAR) ×1
OBTURATOR OPTICAL STND 8 DVNC (TROCAR) ×2
OBTURATOR OPTICALSTD 8 DVNC (TROCAR) ×2 IMPLANT
PACK ROBOT GYN CUSTOM WL (TRAY / TRAY PROCEDURE) ×3 IMPLANT
PAD POSITIONING PINK XL (MISCELLANEOUS) ×3 IMPLANT
PENCIL HANDSWITCHING (ELECTRODE) ×1 IMPLANT
POUCH SPECIMEN RETRIEVAL 10MM (ENDOMECHANICALS) ×2 IMPLANT
SCISSORS LAP 5X35 DISP (ENDOMECHANICALS) ×1 IMPLANT
SEAL CANN UNIV 5-8 DVNC XI (MISCELLANEOUS) ×8 IMPLANT
SEAL XI 5MM-8MM UNIVERSAL (MISCELLANEOUS) ×4
SET TRI-LUMEN FLTR TB AIRSEAL (TUBING) ×4 IMPLANT
SLEEVE XCEL OPT CAN 5 100 (ENDOMECHANICALS) ×1 IMPLANT
SPONGE GAUZE 2X2 STER 10/PKG (GAUZE/BANDAGES/DRESSINGS) ×1
SPONGE LAP 18X18 X RAY DECT (DISPOSABLE) ×1 IMPLANT
SURGIFLO W/THROMBIN 8M KIT (HEMOSTASIS) IMPLANT
SUT PDS AB 0 CT1 36 (SUTURE) ×1 IMPLANT
SUT VIC AB 0 CT1 27 (SUTURE) ×9
SUT VIC AB 0 CT1 27XBRD ANTBC (SUTURE) IMPLANT
SUT VIC AB 0 CT1 36 (SUTURE) ×1 IMPLANT
SUT VICRYL 0 UR6 27IN ABS (SUTURE) ×1 IMPLANT
SYR 10ML LL (SYRINGE) ×2 IMPLANT
SYR 20CC LL (SYRINGE) ×1 IMPLANT
TOWEL OR NON WOVEN STRL DISP B (DISPOSABLE) ×3 IMPLANT
TRAP SPECIMEN MUCOUS 40CC (MISCELLANEOUS) ×1 IMPLANT
TRAY FOLEY W/METER SILVER 16FR (SET/KITS/TRAYS/PACK) ×3 IMPLANT
TUBING CONNECTING 10 (TUBING) ×1 IMPLANT
TUBING INSUFFLATION 10FT LAP (TUBING) ×1 IMPLANT
UNDERPAD 30X30 (UNDERPADS AND DIAPERS) ×3 IMPLANT
WATER STERILE IRR 1000ML POUR (IV SOLUTION) ×5 IMPLANT
YANKAUER SUCT BULB TIP 10FT TU (MISCELLANEOUS) ×1 IMPLANT
YANKAUER SUCT BULB TIP NO VENT (SUCTIONS) ×1 IMPLANT

## 2018-02-14 NOTE — H&P (Signed)
New Patient Note: Gyn-Onc  Patient was referred by Dr. Ihor Dow for the evaluation of Joanna Newman 47 y.o. female with class III morbid obesity, poorly controlled diabetes mellitus and bilateral ovarian cysts  CC:  Right ovarian cyst, bilateral ovarian cyst  Assessment/Plan:  Joanna Newman  is a 47 y.o.  year old with a right ovarian cyst which is fairly stable from 2016 but now more symptomatic with pain.  This mass may not be ovarian (given the March US findings) but instead be a para-ovarian cyst or a cyst associated with a non-gyn structure.  She also has a left ovary which may appear somewhat complex on imaging. She has a fibroid uterus.   I am recommending robotic assisted RSO, with possible hysterectomy and LSO (if the left ovary appears abnormal we will perform hysterectomy and BSO). We will obtain frozen section and stage if malignancy is found. If both ovaries are normal intraoperatively, we will restrict surgery to resection of the mass.  Her diabetes is reportedly better controlled. We will check HbA1C today. If so (<8), we will proceed with surgery.  I discussed surgical risks (which are higher for Ms Farabaugh given her morbid obesity, including  bleeding, infection, damage to internal organs (such as bladder,ureters, bowels), blood clot, reoperation and rehospitalization.  We will hold ASA for 10 days preop.   HPI: Joanna Newman is a 47 year old gravida 5 para 3 who is seen in consultation at the request of Dr Ihor Dow for bilateral complex ovarian cysts. The patient has cluster morbid obesity with a BMI greater than 55 kg/m. She has a extremely poorly controlled diabetes mellitus with a recent random blood glucose of greater than 500, and an HbA1c drawn in late January 2016 but was 14.3%. She has untreated essential hypertension.  The patient does not have a primary care doctor and seeks consultations in the emergency room or urgent care clinics  for exacerbations of symptoms such as abdominal pelvic pain. She reports having bilateral intermittent pelvic pain "for years". This became slightly worse since August 2016. She was seen in the emergency room at [facility] in [place] on 11/06/2014 at which time she was presenting with abnormal uterine bleeding and underwent an ultrasound that showed a uterus measuring 11.4 x 5.7 x 6.5 cm with fibroids, a 4 mm endometrial thickness, a right ovary measuring 11.7 x 8.6 x 10.2 cm with a cystic mass and a 2.7 cm mural nodule. The left ovary measures 11.4 x 7.2 x 9.7 cm and included a solid-appearing mass with peripheral vascularity. There was no free fluid. This ultrasound scan was repeated on February25th 2016 and impressions were that the ovarian masses were stable with the right measuring approximately 11 cm in greatest dimension the left measuring 8cm in greatest dimension. Uterine fibroids was suspected.   A CT of the abdomen and pelvis was performed [facility] after emergency room visit for pain on February 25th 2016 and this confirmed a large para umbilical hernia with fat extending through a 6.7 cm defect. There was a hypodense homogeneous mass in the left lateral abdomen at the level of the umbilicus umbilicus measuring 6.8 x 5.7 x 7.8 cm. This did not appear to be related to the adnexa. There was 6 separate adnexal masses noted.   An MR of the pelvis was performed on 05/06/2015 and revealed an enlarged fibroid uterus measuring 10.2 x 9.1 x 13.6 cm. Within the left adnexa there was a cyst measuring 6.5 x 6.5 cm.  There is no solid nodular component internal septation identified. The impressions were large right adnexal cyst. There is a small left adnexal cyst also noted. It is likely that some of the other masses noted on prior scans were in fact fibroids that were better delineated by this MR.   Her recheck of HbA1c was 14% in March, 2016. She began seeing a diabetes educator in April  2016 and altered her diet.  Interval History: she developed a CVA in 2017 and was put on aspirin therapy. She began receiving insulin for her diabetes and her HbA1c obtained improved control. Dr Alroy Dust manages her diabetes from [place].   She reports somewhat irregular but not heavy periods.  She began experiencing increased RLQ pains and nausea and emesis in the early months of 2019.  She was seen in ED's and imaging performed including a CT on 01/08/18 which showed a complex cystic right adnexal mass has slightly enlarged in size and measures 13 x 8.4 x 10.1 cm. The solid component in the superior aspect of the lesion has also enlarged in size. Slow growing ovarian malignancy is of consideration. Surgical consultation is recommended. Large leiomyomatous uterus. Left adnexal mass versus exophytic fibroid. Simple cystic structure in the left retroperitoneum has decreased in size, etiology uncertain.  An [place] (preceding) on 01/01/18 showed a uterus with measurements: 12.9 cm craniocaudad by 11.7 cm transverse. Redemonstration of previously noted leiomyomata, subserosal on the right measuring up to 6.8 cm, subserosal on the left measuring up to 2.7 cm and lower uterine on the right measuring up to 6.6 cm allowing for operator dependent imaging differences. Endometrium Thickness: 7.5 mm.  No focal abnormality visualized. Right ovary Measurements: 4 x 2.3 x 3.8 cm. Small physiologic sized anechoic follicle is noted within. Left ovary Measurements: 6.4 x 2.7 x 5.4 cm and contains an anechoic follicle measuring 2.4 x 2.1 cm. Normal appearance/no adnexal mass. Other findings: Redemonstration of a circumscribed partially solid partially cystic right adnexal mass to the right of the umbilicus, previously documented on 2016 cross-sectional imaging exams currently measuring 10.9 x 7.9 x 10.1 cm with avascular echogenic intraluminal focus seen within as before.   Current Meds:  Outpatient Encounter  Medications as of 01/13/2018  Medication Sig  . amLODipine (NORVASC) 2.5 MG tablet Take 1 tablet (2.5 mg total) by mouth daily.  [name] aspirin EC 325 MG EC tablet Take 1 tablet (325 mg total) by mouth daily.  [name] atorvastatin (LIPITOR) 20 MG tablet Take 1 tablet (20 mg total) by mouth daily at 6 PM.  . doxepin (SINEQUAN) 10 MG capsule Take 10 mg by mouth at bedtime as needed (For sleep.).   [name] ibuprofen (ADVIL,MOTRIN) 800 MG tablet Take 1 tablet (800 mg total) by mouth every 8 (eight) hours as needed.  . metFORMIN (GLUCOPHAGE) 500 MG tablet Take 1 tablet (500 mg total) by mouth 2 (two) times daily with a meal. (Patient taking differently: Take 1,000 mg by mouth 2 (two) times daily with a meal. )  . Omega-3 Fatty Acids (FISH OIL PO) Take 1 capsule by mouth daily.  [name] OVER THE COUNTER MEDICATION Fish Oil daily  . oxyCODONE-acetaminophen (PERCOCET/ROXICET) 5-325 MG tablet Take 1 tablet by mouth every 6 (six) hours as needed.  [name] oxyCODONE-acetaminophen (PERCOCET/ROXICET) 5-325 MG tablet Take 1 tablet by mouth every 6 (six) hours as needed.  . repaglinide (PRANDIN) 0.5 MG tablet Take 0.5 mg by mouth 3 (three) times daily before meals.  [name] UNABLE TO FIND 1,000 mg. Med Name:  Black Seed Oil  . traMADol (ULTRAM) 50 MG tablet Take 1 tablet (50 mg total) by mouth every 6 (six) hours as needed.  . [DISCONTINUED] Flaxseed, Linseed, (FLAXSEED OIL) 1000 MG CAPS Take 1 capsule by mouth daily.  . [DISCONTINUED] ibuprofen (ADVIL,MOTRIN) 600 MG tablet Take 1 tablet (600 mg total) by mouth every 6 (six) hours as needed.   No facility-administered encounter medications on file as of 01/13/2018.     Allergy:  Allergies  Allergen Reactions  . Lisinopril Swelling    Social Hx:   Social History   Socioeconomic History  . Marital status: Married    Spouse name: Not on file  . Number of children: Not on file  . Years of education: Not on file  . Highest education level: Not on file  Occupational History  . Occupation:  event specialist  Social Needs  . Financial resource strain: Not on file  . Food insecurity:    Worry: Not on file    Inability: Not on file  . Transportation needs:    Medical: Not on file    Non-medical: Not on file  Tobacco Use  . Smoking status: Former Smoker    Packs/day: 0.25    Years: 15.00    Pack years: 3.75    Types: Cigarettes    Last attempt to quit: 03/11/2014    Years since quitting: 3.9  . Smokeless tobacco: Never Used  Substance and Sexual Activity  . Alcohol use: Yes    Comment: once a month  . Drug use: Yes    Types: Marijuana    Comment:  history of substance abuse; last marijuana use about 1 week ago  . Sexual activity: Yes    Birth control/protection: Abstinence  Lifestyle  . Physical activity:    Days per week: Not on file    Minutes per session: Not on file  . Stress: Not on file  Relationships  . Social connections:    Talks on phone: Not on file    Gets together: Not on file    Attends religious service: Not on file    Active member of club or organization: Not on file    Attends meetings of clubs or organizations: Not on file    Relationship status: Not on file  . Intimate partner violence:    Fear of current or ex partner: Not on file    Emotionally abused: Not on file    Physically abused: Not on file    Forced sexual activity: Not on file  Other Topics Concern  . 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 CHOLECYSTECTOMY    . HEEL SPUR SURGERY Bilateral   . KNEE SURGERY    . TUBAL LIGATION      Past Medical Hx:  Past Medical History:  Diagnosis Date  . Anxiety   . Arthritis   . Asthma   . Bilateral ovarian cysts   . Depression   . Diabetes mellitus without complication ([place])   . Epidermoid cyst of finger    tip of left little finger  . Fibroids   . GERD (gastroesophageal reflux  disease)   . Hyperlipidemia   . Hypertension   . Insomnia   . LVH (left ventricular hypertrophy)    Moderate  . Obesity   . Pain of right heel   . Sleep  apnea    History of no problems since weight loss  . Stroke ([place]) 07/2016   No residual  . Substance abuse ([place])    cocaine, marijuana and alcohol abuse. quit end of 2012    Past Gynecological History:  Verline Lema, cs x2, [id number]  No LMP recorded. (Menstrual status: Irregular Periods).  Family Hx:  Family History  Problem Relation Age of Onset  . Hypertension Mother   . Cancer Mother 56       colon  . Heart disease Father 42       heart failure  . Diabetes Father   . Heart failure Father   . Diabetes Maternal Grandmother   . CVA Maternal Grandmother 76  . Diabetes Paternal Grandmother   . CVA Maternal Aunt 38       Aneurysm    Review of Systems:  Constitutional  Feels well,   ENT Normal appearing ears and nares bilaterally Skin/Breast  No rash, sores, jaundice, itching, dryness Cardiovascular  No chest pain, shortness of breath, or edema  Pulmonary  No cough or wheeze.  Gastro Intestinal  No nausea, vomitting, or diarrhoea. No bright red blood per rectum, no abdominal pain, change in bowel movement, or constipation.  Genito Urinary  No frequency, urgency, dysuria, Musculo Skeletal  No myalgia, arthralgia, joint swelling or pain  Neurologic  No weakness, numbness, change in gait,  Psychology  No depression, anxiety, insomnia.   Vitals:  Blood pressure 140/86, pulse 69, temperature 98.1 F (36.7 C), temperature source Oral, resp. rate 16, height 5\' 4"  (1.626 m), weight 268 lb (121.6 kg), SpO2 98 %.  Physical Exam: WD in NAD Neck  deferred Lymph Node Survey deferred Cardiovascular  deferred Lungs  deferred Skin  No rash/lesions/breakdown  Psychiatry  Alert and oriented to person, place, and time  Abdomen  Morbidly obese. Unable to feel mass in abdomen.  Back deferred Genito Urinary  Normal  external female genitalia Normal vaginal Cervix, palpably and visibly normal Uterus bulky, globular but with good mobility and access via sidewalls No discretely palpable ovarian cysts.  Rectal  deferred Extremities  No bilateral cyanosis, clubbing or edema.   Thereasa Solo, MD   02/14/2018, 7:18 AM

## 2018-02-14 NOTE — Anesthesia Procedure Notes (Signed)
Procedure Name: Intubation Date/Time: 02/14/2018 7:41 AM Performed by: Sharlette Dense, CRNA Patient Re-evaluated:Patient Re-evaluated prior to induction Oxygen Delivery Method: Circle system utilized Preoxygenation: Pre-oxygenation with 100% oxygen Induction Type: IV induction Ventilation: Mask ventilation without difficulty and Oral airway inserted - appropriate to patient size Laryngoscope Size: Sabra Heck and 2 Grade View: Grade I Tube type: Oral Tube size: 7.5 mm Number of attempts: 1 Airway Equipment and Method: Stylet Placement Confirmation: ETT inserted through vocal cords under direct vision,  positive ETCO2 and breath sounds checked- equal and bilateral Secured at: 21 cm Tube secured with: Tape Dental Injury: Teeth and Oropharynx as per pre-operative assessment

## 2018-02-14 NOTE — Transfer of Care (Signed)
Immediate Anesthesia Transfer of Care Note  Patient: Joanna Newman  Procedure(s) Performed: XI ROBOTIC ASSISTED RIGHT SALPINGO OOPHORECTOMY, OMENTECTOMY (N/A ) MINI-LAPAROTOMY (N/A ) LYSIS OF ADHESION  Patient Location: PACU  Anesthesia Type:General  Level of Consciousness: awake, alert  and oriented  Airway & Oxygen Therapy: Patient Spontanous Breathing and Patient connected to face mask oxygen  Post-op Assessment: Report given to RN and Post -op Vital signs reviewed and stable  Post vital signs: Reviewed and stable  Last Vitals:  Vitals Value Taken Time  BP 159/134 02/14/2018 10:47 AM  Temp    Pulse 86 02/14/2018 10:49 AM  Resp 17 02/14/2018 10:49 AM  SpO2 100 % 02/14/2018 10:49 AM  Vitals shown include unvalidated device data.  Last Pain:  Vitals:   02/14/18 0548  TempSrc: Oral         Complications: No apparent anesthesia complications

## 2018-02-14 NOTE — Anesthesia Postprocedure Evaluation (Signed)
Anesthesia Post Note  Patient: Joanna Newman  Procedure(s) Performed: XI ROBOTIC ASSISTED RIGHT SALPINGO OOPHORECTOMY, OMENTECTOMY (N/A ) MINI-LAPAROTOMY (N/A ) LYSIS OF ADHESION     Patient location during evaluation: PACU Anesthesia Type: General Level of consciousness: awake and alert Pain management: pain level controlled Vital Signs Assessment: post-procedure vital signs reviewed and stable Respiratory status: spontaneous breathing, nonlabored ventilation and respiratory function stable Cardiovascular status: blood pressure returned to baseline and stable Postop Assessment: no apparent nausea or vomiting Anesthetic complications: no    Last Vitals:  Vitals:   02/14/18 1220 02/14/18 1324  BP: (!) 155/97 (!) 136/97  Pulse: 92 97  Resp: 14 14  Temp: 36.7 C 36.6 C  SpO2: 99% 94%    Last Pain:  Vitals:   02/14/18 1324  TempSrc: Oral  PainSc:                  Audry Pili

## 2018-02-14 NOTE — Op Note (Signed)
OPERATIVE NOTE  Date: 02/14/18  Preoperative Diagnosis: right ovarian cyst   Postoperative Diagnosis:  Same and fibroid uterus, and umbilical hernia with omental adhesions.   Procedure(s) Performed: laparoscopic lysis of adhesions x 60 minutes. Robotic-assisted laparoscopic right salpingo-oophorectomy, omentectomy, minilaparotomy for specimen delivery  Surgeon: Everitt Amber, M.D.  Assistant Surgeon: Lahoma Crocker M.D. (an MD assistant was necessary for tissue manipulation, management of robotic instrumentation, retraction and positioning due to the complexity of the case and hospital policies).   Anesthesia: Gen. endotracheal.  Specimens: right tube and ovary, washings, omentum  Estimated Blood Loss: 600 mL. Blood Replacement: None  Complications: bleeding from left superior epigastric artery requiring minilaparotomy for exposure and primary repair.  Indication for Procedure:  Pelvic pain, right ovarian cyst.  Operative Findings: 6cm right ovarian cyst, deflated from preoperative rupture. Normal left tube and ovary. Fibroid uterus with bulky subserosal fundal fibroid and lower uterine segment posterior fibroid. Small umbilical hernia with omentum adhesed within sac. Benign cyst (fibroma vs fibrothecoma) on frozen section.   Frozen pathology was consistent with see above.  Procedure: The patient's taken to the operating room and placed under general endotracheal anesthesia testing difficulty. She is placed in a dorsolithotomy position and cervical acromial pad was placed. The arms were tucked with care taken to pad the olecranon process. And prepped and draped in usual sterile fashion. A uterine manipulator (zumi) was placed vaginally. A 52mm incision was made in the left upper quadrant palmer's point and a 5 mm Optiview trocar used to enter the abdomen under direct visualization. With entry into the abdomen and then maintenance of 15 mm of mercury the patient was placed in Trendelenburg  position. An incision was made in the umbilicus and two incisions were made lateral to the umbilical incision in the left and right abdomen measuring 35mm. An additional 24mm port was placed left lateral. These incisions were made approximately 10 cm lateral to the umbilical incision. 8 mm robotic trochars were inserted into all but the umbilical portsite as this was overlying adhesions with the hernia sac.   For 60 minutes sharp and blunt adhesiolysis was performed to take down the adhesions of omentum to the hernia sac and anterior abdominal wall using laparoscopic scissors and graspers.  The umbilical port was then placed.   The robot was docked.  The abdomen was inspected as was the pelvis.  Pelvic washings were obtained. An incision was made on the right pelvic side wall peritoneum parallel to the IP ligament and the retroperitoneal space entered. The right ureter was identified and the para-rectal space was developed. A window was created in the right broad ligament above the ureter. The right infundibulopelvic vessels were skeletonized cauterized and transected. The utero-ovarian ligaments similarly were cauterized and transected. Specimen was placed in an Endo Catch bag.  For 30 minutes there was attempted extraction of the endocatch bag from the left upper quadrant portsite, however, due to the fibromatous solid nature of some of the ovary, the left upper quadrant incision needed to be extended to facilitate removal.   After removal it was sent for frozen section.  The robot was redocked. There was bleeding noted from where the uterine manipulator had passed through the serosal surface of the uterus. The manipulator was removed and the defect on the uterus was closed with 0-vicryl in figure of 8 sutures. Hemostasis was observed.  The omentectomy was then inspected and noted to have bleeding and non-viability from most of the infracolic omentum due to the adhesiolysis.  Therefore an infracolic  omentectomy was performed. The omentum was delivered into the mid abdomen and elevated. The parietal peritonum that attaches the omentum to the transverse colon was incised facilitating separation of the mid portion of the omentum from the transverse colon. The right sided omentum with its vascular pedicles was then skeletonized in its attachments to the right transverse colon. These vascular pedicles were bipolar sealed and transected. Observation of the colonic wall occurred throughout. The dissection then moved progressively along the colon the the left splenic flexure of the transverse colon. The bipolar sealing forceps and the scissors were used to skeletonize vascular omental pedicles, seal them and transect them until the entire infracolic omentum had been freed from its transverse colonic attachments to the splenic flexure. The omentum was then placed in an endocatch bag and retrieved from the left upper quadrant incision.  The robot was undocked. There was brisk arterial bleeding noted from the left upper quadrant incision where it had been exended to facilitate specimen retrieval. The incision was extended to facilitate visualization. It was apparent the left superior epigastric artery was bleeding. Vicryl suture was used to make this hemostatic. The peritoneum was then closed with 0-vicryl running suture. The muscle and fascia was then closed with running 0 PDS suture. Hemostasis was observed. Exparel was infiltrated into the incision in the left upper quadrant and laparoscopic port sites.   All incisions were closed with a running subcuticular Monocryl suture. Dermabond was applied. Sponge, lap and needle counts were correct x 3.    The patient had sequential compression devices for VTE prophylaxis.         Disposition: PACU          Condition: stable  Donaciano Eva, MD

## 2018-02-15 ENCOUNTER — Encounter (HOSPITAL_COMMUNITY): Payer: Self-pay | Admitting: Radiology

## 2018-02-15 ENCOUNTER — Ambulatory Visit (HOSPITAL_COMMUNITY): Payer: BLUE CROSS/BLUE SHIELD

## 2018-02-15 DIAGNOSIS — K660 Peritoneal adhesions (postprocedural) (postinfection): Secondary | ICD-10-CM | POA: Diagnosis not present

## 2018-02-15 DIAGNOSIS — I10 Essential (primary) hypertension: Secondary | ICD-10-CM | POA: Diagnosis not present

## 2018-02-15 DIAGNOSIS — N7011 Chronic salpingitis: Secondary | ICD-10-CM | POA: Diagnosis not present

## 2018-02-15 DIAGNOSIS — F329 Major depressive disorder, single episode, unspecified: Secondary | ICD-10-CM | POA: Diagnosis not present

## 2018-02-15 DIAGNOSIS — L7632 Postprocedural hematoma of skin and subcutaneous tissue following other procedure: Secondary | ICD-10-CM | POA: Diagnosis not present

## 2018-02-15 DIAGNOSIS — G4730 Sleep apnea, unspecified: Secondary | ICD-10-CM | POA: Diagnosis not present

## 2018-02-15 DIAGNOSIS — D270 Benign neoplasm of right ovary: Secondary | ICD-10-CM | POA: Diagnosis not present

## 2018-02-15 DIAGNOSIS — E119 Type 2 diabetes mellitus without complications: Secondary | ICD-10-CM | POA: Diagnosis not present

## 2018-02-15 DIAGNOSIS — D259 Leiomyoma of uterus, unspecified: Secondary | ICD-10-CM | POA: Diagnosis not present

## 2018-02-15 DIAGNOSIS — M96841 Postprocedural hematoma of a musculoskeletal structure following other procedure: Secondary | ICD-10-CM | POA: Diagnosis not present

## 2018-02-15 DIAGNOSIS — K429 Umbilical hernia without obstruction or gangrene: Secondary | ICD-10-CM | POA: Diagnosis not present

## 2018-02-15 DIAGNOSIS — I9742 Intraoperative hemorrhage and hematoma of a circulatory system organ or structure complicating other procedure: Secondary | ICD-10-CM | POA: Diagnosis not present

## 2018-02-15 DIAGNOSIS — F419 Anxiety disorder, unspecified: Secondary | ICD-10-CM | POA: Diagnosis not present

## 2018-02-15 DIAGNOSIS — E785 Hyperlipidemia, unspecified: Secondary | ICD-10-CM | POA: Diagnosis not present

## 2018-02-15 DIAGNOSIS — G4700 Insomnia, unspecified: Secondary | ICD-10-CM | POA: Diagnosis not present

## 2018-02-15 LAB — BASIC METABOLIC PANEL
ANION GAP: 9 (ref 5–15)
BUN: 12 mg/dL (ref 6–20)
CO2: 24 mmol/L (ref 22–32)
CREATININE: 0.76 mg/dL (ref 0.44–1.00)
Calcium: 8.7 mg/dL — ABNORMAL LOW (ref 8.9–10.3)
Chloride: 106 mmol/L (ref 101–111)
GFR calc non Af Amer: >60 mL/min (ref >60)
GLUCOSE: 123 mg/dL — AB (ref 65–99)
POTASSIUM: 3.9 mmol/L (ref 3.5–5.1)
Sodium: 139 mmol/L (ref 135–145)

## 2018-02-15 LAB — CBC
HCT: 24.6 % — ABNORMAL LOW (ref 36.0–46.0)
Hemoglobin: 8.1 g/dL — ABNORMAL LOW (ref 12.0–15.0)
MCH: 29.5 pg (ref 26.0–34.0)
MCHC: 32.9 g/dL (ref 30.0–36.0)
MCV: 89.5 fL (ref 78.0–100.0)
PLATELETS: 294 10*3/uL (ref 150–400)
RBC: 2.75 MIL/uL — AB (ref 3.87–5.11)
RDW: 14.3 % (ref 11.5–15.5)
WBC: 9.6 10*3/uL (ref 4.0–10.5)

## 2018-02-15 LAB — GLUCOSE, CAPILLARY
GLUCOSE-CAPILLARY: 113 mg/dL — AB (ref 65–99)
Glucose-Capillary: 132 mg/dL — ABNORMAL HIGH (ref 65–99)

## 2018-02-15 MED ORDER — IOPAMIDOL (ISOVUE-370) INJECTION 76%
INTRAVENOUS | Status: AC
  Filled 2018-02-15: qty 100

## 2018-02-15 MED ORDER — OXYCODONE-ACETAMINOPHEN 5-325 MG PO TABS
2.0000 | ORAL_TABLET | ORAL | Status: DC | PRN
  Administered 2018-02-15: 2 via ORAL
  Filled 2018-02-15: qty 2

## 2018-02-15 MED ORDER — OXYCODONE-ACETAMINOPHEN 5-325 MG PO TABS: 2.0000 | ORAL_TABLET | Freq: Four times a day (QID) | ORAL | 0 refills | Status: DC | PRN

## 2018-02-15 MED ORDER — IOPAMIDOL (ISOVUE-370) INJECTION 76%
100.0000 mL | Freq: Once | INTRAVENOUS | Status: AC | PRN
  Administered 2018-02-15: 100 mL via INTRAVENOUS

## 2018-02-15 MED ORDER — SENNOSIDES-DOCUSATE SODIUM 8.6-50 MG PO TABS: 2.0000 | ORAL_TABLET | Freq: Every day | ORAL | 0 refills | Status: DC

## 2018-02-15 NOTE — Discharge Instructions (Signed)
02/15/2018  Return to work: 4 weeks  Activity: 1. Be up and out of the bed during the day.  Take a nap if needed.  You may walk up steps but be careful and use the hand rail.  Stair climbing will tire you more than you think, you may need to stop part way and rest.   2. No lifting or straining for 6 weeks.  3. No driving for 2 weeks.  Do Not drive if you are taking narcotic pain medicine.  4. Shower daily.  Use soap and water on your incision and pat dry; don't rub.   5. No sexual activity and nothing in the vagina for 2 weeks.  Medications:  - Take ibuprofen and tylenol first line for pain control. Take these regularly (every 6 hours) to decrease the build up of pain.  - If necessary, for severe pain not relieved by ibuprofen, take percocet, 2 tablets.  - While taking percocet you should take sennakot every night to reduce the likelihood of constipation. If this causes diarrhea, stop its use.  - continue to hold your aspirin for 2 additional weeks postop.   Diet: 1. Low sodium Heart Healthy Diet is recommended.  2. It is safe to use a laxative if you have difficulty moving your bowels.   Wound Care: 1. Keep clean and dry.  Shower daily.  Reasons to call the Doctor:   Fever - Oral temperature greater than 100.4 degrees Fahrenheit  Foul-smelling vaginal discharge  Difficulty urinating  Nausea and vomiting  Increased pain at the site of the incision that is unrelieved with pain medicine.  Difficulty breathing with or without chest pain  New calf pain especially if only on one side  Sudden, continuing increased vaginal bleeding with or without clots.   Follow-up: 1. See Everitt Amber in 3 weeks.  Contacts: For questions or concerns you should contact:  Dr. Everitt Amber at [phone] After hours and on week-ends call [phone] and ask to speak to the physician on call for Gynecologic Oncology

## 2018-02-15 NOTE — Discharge Summary (Signed)
Physician Discharge Summary  Patient ID: Joanna Newman MRN: [id number] DOB/AGE: December 13, 1970 47 y.o.  Admit date: 02/14/2018 Discharge date: 02/15/2018  Admission Diagnoses: Bilateral ovarian cysts  Discharge Diagnoses:  Principal Problem:   Bilateral ovarian cysts Active Problems:   Morbid obesity with BMI of 50.0-59.9, adult [facility])   Pelvic mass in female   Discharged Condition: good  Hospital Course:  1/ patient was admitted on 02/14/18 for a robotic right salpingo-oophorectomy, omentectomy, lysis of adhesions, minilaparotomy and repair of superior epigastric vessel bleeding. 2/ surgery was complicated by bleeding from the superior epigastric vessel which required extending the abdominal incision and providing additional suturing. 3/ on postoperative day 1 she reported severe LUQ pain around this incision. On exam, no mass or swelling was appreciated. A CT was ordered to better evaluate the muscles layers and abdominal wall for occult hematoma. There was a hematoma appreciated in the Left abdo, but this was at the lateral port site and not the site of her symptoms.  The patient was meeting discharge criteria: tolerating PO, voiding urine, ambulating, pain adequately controlled on oral medications.  4/ new medications on discharge include a new refill of percocet.  She will hold aspirin for 2 weeks  Consults: None  Significant Diagnostic Studies: labs:  CBC    Component Value Date/Time   WBC 9.6 02/15/2018 0604   RBC 2.75 (L) 02/15/2018 0604   HGB 8.1 (L) 02/15/2018 0604   HCT 24.6 (L) 02/15/2018 0604   PLT 294 02/15/2018 0604   MCV 89.5 02/15/2018 0604   MCH 29.5 02/15/2018 0604   MCHC 32.9 02/15/2018 0604   RDW 14.3 02/15/2018 0604   LYMPHSABS 3.4 01/07/2018 2357   MONOABS 0.5 01/07/2018 2357   EOSABS 0.0 01/07/2018 2357   BASOSABS 0.1 01/07/2018 2357   CT scan abdo/pelvis showed 6x2cm Left abd wall hematoma assocaited with lateral inferior incision.  Treatments:  surgery: see above  Discharge Exam: Blood pressure 132/83, pulse 84, temperature 97.7 F (36.5 C), temperature source Oral, resp. rate 15, height 5\' 4"  (1.626 m), weight 268 lb (121.6 kg), last menstrual period 01/09/2018, SpO2 100 %. General appearance: alert and cooperative GI: soft, non-tender; bowel sounds normal; no masses,  no organomegaly and tender around LUQ incision, but no palpable masses. Other abdominal sites nontender Incision/Wound: clean and intact x 5  Disposition: Discharge disposition: 01-Home or Self Care       Discharge Instructions    (HEART FAILURE PATIENTS) Call MD:  Anytime you have any of the following symptoms: 1) 3 pound weight gain in 24 hours or 5 pounds in 1 week 2) shortness of breath, with or without a dry hacking cough 3) swelling in the hands, feet or stomach 4) if you have to sleep on extra pillows at night in order to breathe.   Complete by:  As directed    Call MD for:  difficulty breathing, headache or visual disturbances   Complete by:  As directed    Call MD for:  extreme fatigue   Complete by:  As directed    Call MD for:  hives   Complete by:  As directed    Call MD for:  persistant dizziness or light-headedness   Complete by:  As directed    Call MD for:  persistant nausea and vomiting   Complete by:  As directed    Call MD for:  redness, tenderness, or signs of infection (pain, swelling, redness, odor or green/yellow discharge around incision site)   Complete  by:  As directed    Call MD for:  severe uncontrolled pain   Complete by:  As directed    Call MD for:  temperature >100.4   Complete by:  As directed    Diet - low sodium heart healthy   Complete by:  As directed    Diet general   Complete by:  As directed    Driving Restrictions   Complete by:  As directed    No driving for 7 days or until off narcotic pain medication   Increase activity slowly   Complete by:  As directed    Remove dressing in 24 hours   Complete by:  As  directed    Sexual Activity Restrictions   Complete by:  As directed    No intercourse for 6 weeks     Allergies as of 02/15/2018      Reactions   Lisinopril Swelling      Medication List    TAKE these medications   amLODipine 2.5 MG tablet Commonly known as:  NORVASC Take 1 tablet (2.5 mg total) by mouth daily.   aspirin 325 MG EC tablet Take 1 tablet (325 mg total) by mouth daily.   atorvastatin 20 MG tablet Commonly known as:  LIPITOR Take 1 tablet (20 mg total) by mouth daily at 6 PM.   BLACK CURRANT SEED OIL PO Take 1,000 mg by mouth daily.   doxepin 10 MG capsule Commonly known as:  SINEQUAN Take 10 mg by mouth at bedtime as needed (For sleep.).   Fish Oil 1000 MG Caps Take 1,000 mg by mouth daily.   HYDROCORTISONE EX Apply 1 application topically 2 (two) times daily as needed (for eczema.).   ibuprofen 800 MG tablet Commonly known as:  ADVIL,MOTRIN Take 1 tablet (800 mg total) by mouth every 8 (eight) hours as needed. What changed:  reasons to take this   metFORMIN 1000 MG tablet Commonly known as:  GLUCOPHAGE Take 1,000 mg by mouth 2 (two) times daily.   oxyCODONE-acetaminophen 5-325 MG tablet Commonly known as:  PERCOCET/ROXICET Take 2 tablets by mouth every 6 (six) hours as needed. What changed:  how much to take   repaglinide 0.5 MG tablet Commonly known as:  PRANDIN Take 0.5 mg by mouth 3 (three) times daily before meals.   senna-docusate 8.6-50 MG tablet Commonly known as:  Senokot-S Take 2 tablets by mouth at bedtime.   traMADol 50 MG tablet Commonly known as:  ULTRAM Take 1 tablet (50 mg total) by mouth every 6 (six) hours as needed for severe pain.        Signed: Thereasa Solo 02/15/2018, 2:53 PM

## 2018-02-15 NOTE — Progress Notes (Signed)
Reviewed CT scan images and report. Left upper quadrant (where patient has her pain symptoms) has only a small hematoma. Larger hematoma associated with left lateral collection is not her site of symptoms.  Scan does not demonstrate findings concerning for active bleeding.   Will discharge to home with follow-up in outpatient setting.  Thereasa Solo, MD

## 2018-02-15 NOTE — Progress Notes (Signed)
Called CT and made them aware, patient now has appropriate IV access.

## 2018-02-16 ENCOUNTER — Telehealth: Payer: Self-pay

## 2018-02-16 NOTE — Telephone Encounter (Signed)
Joanna Newman stated that she was to remove the bandages from her incisions today per D/C instructions.  She did so, however,the larger incision is draining some blood. Told patient to apply a tight Telfa dressing daily.  She can keep it off when there is no drainage noted. Told her that Dr. Denman George wants her to hold her ASA 325 mg daily for 2 weeks. Pt to call if bleeding increases. Pt verbalized understanding.

## 2018-03-10 ENCOUNTER — Encounter: Payer: Self-pay | Admitting: Gynecologic Oncology

## 2018-03-10 ENCOUNTER — Inpatient Hospital Stay: Payer: BLUE CROSS/BLUE SHIELD | Attending: Gynecologic Oncology | Admitting: Gynecologic Oncology

## 2018-03-10 VITALS — BP 129/82 | HR 70 | Temp 98.6°F | Resp 20 | Ht 63.5 in | Wt 263.8 lb

## 2018-03-10 DIAGNOSIS — Z90722 Acquired absence of ovaries, bilateral: Secondary | ICD-10-CM | POA: Diagnosis not present

## 2018-03-10 DIAGNOSIS — N83209 Unspecified ovarian cyst, unspecified side: Secondary | ICD-10-CM

## 2018-03-10 DIAGNOSIS — N83202 Unspecified ovarian cyst, left side: Secondary | ICD-10-CM | POA: Insufficient documentation

## 2018-03-10 DIAGNOSIS — R1031 Right lower quadrant pain: Secondary | ICD-10-CM

## 2018-03-10 DIAGNOSIS — Z90710 Acquired absence of both cervix and uterus: Secondary | ICD-10-CM | POA: Insufficient documentation

## 2018-03-10 DIAGNOSIS — Z87891 Personal history of nicotine dependence: Secondary | ICD-10-CM

## 2018-03-10 DIAGNOSIS — Z6841 Body Mass Index (BMI) 40.0 and over, adult: Secondary | ICD-10-CM | POA: Insufficient documentation

## 2018-03-10 DIAGNOSIS — N83201 Unspecified ovarian cyst, right side: Secondary | ICD-10-CM | POA: Diagnosis not present

## 2018-03-10 NOTE — Progress Notes (Signed)
Followup Note: Gyn-Onc  Patient was referred by Dr. Ihor Dow for the evaluation of Joanna Newman 47 y.o. female with class III morbid obesity, poorly controlled diabetes mellitus and bilateral ovarian cysts  CC:  Chief Complaint  Patient presents with  . Cyst of ovary, unspecified laterality    Assessment/Plan:  [name]  is a 47 y.o.  year old who is s/p robotic assisted right salpingo-oophorectomy for a right ovarian mass on 02/14/18. Her pathology was benign. Other than uterine fibroids, no other intraperitoneal pathology was identified to explain her weightloss or right lower quadrant pains. I will have her see GI to further evaluate these symptoms, though I explained an explanation may not be found.  She will follow-up with Dr Hulan Fray for ongoing wellness care.  HPI: Joanna Newman is a 47 year old gravida 5 para 3 who is seen in consultation at the request of Dr Ihor Dow for bilateral complex ovarian cysts. The patient has cluster morbid obesity with a BMI greater than 55 kg/m. She has a extremely poorly controlled diabetes mellitus with a recent random blood glucose of greater than 500, and an HbA1c drawn in late January 2016 but was 14.3%. She has untreated essential hypertension.  The patient does not have a primary care doctor and seeks consultations in the emergency room or urgent care clinics for exacerbations of symptoms such as abdominal pelvic pain. She reports having bilateral intermittent pelvic pain "for years". This became slightly worse since August 2016. She was seen in the emergency room at [facility] in [place] on 11/06/2014 at which time she was presenting with abnormal uterine bleeding and underwent an ultrasound that showed a uterus measuring 11.4 x 5.7 x 6.5 cm with fibroids, a 4 mm endometrial thickness, a right ovary measuring 11.7 x 8.6 x 10.2 cm with a cystic mass and a 2.7 cm mural nodule. The left ovary measures 11.4 x 7.2 x  9.7 cm and included a solid-appearing mass with peripheral vascularity. There was no free fluid. This ultrasound scan was repeated on February25th 2016 and impressions were that the ovarian masses were stable with the right measuring approximately 11 cm in greatest dimension the left measuring 8cm in greatest dimension. Uterine fibroids was suspected.   A CT of the abdomen and pelvis was performed [facility] after emergency room visit for pain on February 25th 2016 and this confirmed a large para umbilical hernia with fat extending through a 6.7 cm defect. There was a hypodense homogeneous mass in the left lateral abdomen at the level of the umbilicus umbilicus measuring 6.8 x 5.7 x 7.8 cm. This did not appear to be related to the adnexa. There was 6 separate adnexal masses noted.   An MR of the pelvis was performed on 05/06/2015 and revealed an enlarged fibroid uterus measuring 10.2 x 9.1 x 13.6 cm. Within the left adnexa there was a cyst measuring 6.5 x 6.5 cm. There is no solid nodular component internal septation identified. The impressions were large right adnexal cyst. There is a small left adnexal cyst also noted. It is likely that some of the other masses noted on prior scans were in fact fibroids that were better delineated by this MR.   Her recheck of HbA1c was 14% in March, 2016. She began seeing a diabetes educator in April 2016 and altered her diet.  Interval History: she developed a CVA in 2017 and was put on aspirin therapy. She began receiving insulin for her diabetes and her HbA1c obtained  improved control. Dr Alroy Dust manages her diabetes from [place].   She reports somewhat irregular but not heavy periods.  She began experiencing increased RLQ pains and nausea and emesis in the early months of 2019.  She was seen in ED's and imaging performed including a CT on 01/08/18 which showed a complex cystic right adnexal mass has slightly enlarged in size and measures 13 x 8.4 x 10.1  cm. The solid component in the superior aspect of the lesion has also enlarged in size. Slow growing ovarian malignancy is of consideration. Surgical consultation is recommended. Large leiomyomatous uterus. Left adnexal mass versus exophytic fibroid. Simple cystic structure in the left retroperitoneum has decreased in size, etiology uncertain.  An [place] (preceding) on 01/01/18 showed a uterus with measurements: 12.9 cm craniocaudad by 11.7 cm transverse. Redemonstration of previously noted leiomyomata, subserosal on the right measuring up to 6.8 cm, subserosal on the left measuring up to 2.7 cm and lower uterine on the right measuring up to 6.6 cm allowing for operator dependent imaging differences. Endometrium Thickness: 7.5 mm.  No focal abnormality visualized. Right ovary Measurements: 4 x 2.3 x 3.8 cm. Small physiologic sized anechoic follicle is noted within. Left ovary Measurements: 6.4 x 2.7 x 5.4 cm and contains an anechoic follicle measuring 2.4 x 2.1 cm. Normal appearance/no adnexal mass. Other findings: Redemonstration of a circumscribed partially solid partially cystic right adnexal mass to the right of the umbilicus, previously documented on 2016 cross-sectional imaging exams currently measuring 10.9 x 7.9 x 10.1 cm with avascular echogenic intraluminal focus seen within as before.  On Feb 14, 2018 she underwent a robotic assisted right salpingo-oophorectomy and lysis of adhesions.  Intraoperative findings were significant for omental adhesions to the anterior abdominal wall and a small umbilical hernia.  There was a partially deflated and obviously ruptured prior to surgery 6 cm right ovarian cyst (this was smaller than the size that had been noted on preoperative imaging).  There is a normal left tube and ovary.  There was a fibroid uterus with a bulky subserosal fundal fibroid and a lower uterine segment posterior fibroid.  There is a small umbilical hernia with omentum adhesed within  the sac.  There were no explanations intraperitoneally for her abdominal pain other than the finding of the cyst.  The cyst was benign either a fibroma or fibrothecoma on frozen section.  Surgery was complicated by bleeding from the left upper quadrant port site likely from bleeding from the superior epigastric vessel during extraction of the ovarian mass from the site.  This required extension of the incision.  The patient was discharged on postoperative day 2 with no additional complications.  She continues to feel the same right sided lower quadrant abdominal pain after surgery.  This is the same as it was preoperatively.  She also reports persistent anorexia and inability to gain weight.  Otherwise she is healed well from her surgery with no incisional issues.   Current Meds:  Outpatient Encounter Medications as of 03/10/2018  Medication Sig  . amLODipine (NORVASC) 2.5 MG tablet Take 1 tablet (2.5 mg total) by mouth daily.  [name] aspirin EC 325 MG EC tablet Take 1 tablet (325 mg total) by mouth daily.  [name] atorvastatin (LIPITOR) 20 MG tablet Take 1 tablet (20 mg total) by mouth daily at 6 PM.  . BLACK CURRANT SEED OIL PO Take 1,000 mg by mouth daily.  [name] doxepin (SINEQUAN) 10 MG capsule Take 10 mg by mouth at bedtime as needed (For  sleep.).   [name] HYDROCORTISONE EX Apply 1 application topically 2 (two) times daily as needed (for eczema.).  [name] ibuprofen (ADVIL,MOTRIN) 800 MG tablet Take 1 tablet (800 mg total) by mouth every 8 (eight) hours as needed. (Patient taking differently: Take 800 mg by mouth every 8 (eight) hours as needed (for pain.). )  . metFORMIN (GLUCOPHAGE) 1000 MG tablet Take 1,000 mg by mouth 2 (two) times daily.  . Omega-3 Fatty Acids (FISH OIL) 1000 MG CAPS Take 1,000 mg by mouth daily.  . repaglinide (PRANDIN) 0.5 MG tablet Take 0.5 mg by mouth 3 (three) times daily before meals.  . senna-docusate (SENOKOT-S) 8.6-50 MG tablet Take 2 tablets by mouth at bedtime.  . traMADol (ULTRAM) 50  MG tablet Take 1 tablet (50 mg total) by mouth every 6 (six) hours as needed for severe pain.  . [DISCONTINUED] oxyCODONE-acetaminophen (PERCOCET/ROXICET) 5-325 MG tablet Take 2 tablets by mouth every 6 (six) hours as needed. (Patient not taking: Reported on 03/10/2018)   No facility-administered encounter medications on file as of 03/10/2018.     Allergy:  Allergies  Allergen Reactions  . Lisinopril Swelling    Social Hx:   Social History   Socioeconomic History  . Marital status: Married    Spouse name: Not on file  . Number of children: Not on file  . Years of education: Not on file  . Highest education level: Not on file  Occupational History  . Occupation: event specialist  Social Needs  . Financial resource strain: Not on file  . Food insecurity:    Worry: Not on file    Inability: Not on file  . Transportation needs:    Medical: Not on file    Non-medical: Not on file  Tobacco Use  . Smoking status: Former Smoker    Packs/day: 0.25    Years: 15.00    Pack years: 3.75    Types: Cigarettes    Last attempt to quit: 03/11/2014    Years since quitting: 4.0  . Smokeless tobacco: Never Used  Substance and Sexual Activity  . Alcohol use: Yes    Comment: once a month  . Drug use: Yes    Types: Marijuana    Comment:  history of substance abuse; last marijuana use about 1 week ago  . Sexual activity: Yes    Birth control/protection: Abstinence  Lifestyle  . Physical activity:    Days per week: Not on file    Minutes per session: Not on file  . Stress: Not on file  Relationships  . Social connections:    Talks on phone: Not on file    Gets together: Not on file    Attends religious service: Not on file    Active member of club or organization: Not on file    Attends meetings of clubs or organizations: Not on file    Relationship status: Not on file  . Intimate partner violence:    Fear of current or ex partner: Not on file    Emotionally abused: Not on file     Physically abused: Not on file    Forced sexual activity: Not on file  Other Topics Concern  . 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 CHOLECYSTECTOMY    . HEEL SPUR SURGERY Bilateral   . KNEE SURGERY    . LAPAROTOMY  WITH STAGING N/A 02/14/2018   Procedure: MINI-LAPAROTOMY;  Surgeon: Everitt Amber, MD;  Location: [facility];  Service: Gynecology;  Laterality: N/A;  . LYSIS OF ADHESION  02/14/2018   Procedure: LYSIS OF ADHESION;  Surgeon: Everitt Amber, MD;  Location: [facility];  Service: Gynecology;;  . ROBOTIC ASSISTED TOTAL HYSTERECTOMY WITH BILATERAL SALPINGO OOPHERECTOMY N/A 02/14/2018   Procedure: XI ROBOTIC ASSISTED RIGHT SALPINGO OOPHORECTOMY, OMENTECTOMY;  Surgeon: Everitt Amber, MD;  Location: [facility];  Service: Gynecology;  Laterality: N/A;  . TUBAL LIGATION      Past Medical Hx:  Past Medical History:  Diagnosis Date  . Anxiety   . Arthritis   . Asthma   . Bilateral ovarian cysts   . Depression   . Diabetes mellitus without complication ([place])   . Epidermoid cyst of finger    tip of left little finger  . Fibroids   . GERD (gastroesophageal reflux disease)   . Hyperlipidemia   . Hypertension   . Insomnia   . LVH (left ventricular hypertrophy)    Moderate  . Obesity   . Pain of right heel   . Sleep apnea    History of no problems since weight loss  . Stroke ([place]) 07/2016   No residual  . Substance abuse ([place])    cocaine, marijuana and alcohol abuse. quit end of 2012    Past Gynecological History:  [facility] Master x2, [id number]  Patient's last menstrual period was 01/09/2018 (approximate).  Family Hx:  Family History  Problem Relation Age of Onset  . Hypertension Mother   . Cancer Mother 66       colon  . Heart disease Father 78       heart failure  . Diabetes Father   . Heart failure Father   . Diabetes Maternal Grandmother   . CVA  Maternal Grandmother 52  . Diabetes Paternal Grandmother   . CVA Maternal Aunt 38       Aneurysm    Review of Systems:  Constitutional  Feels well,   ENT Normal appearing ears and nares bilaterally Skin/Breast  No rash, sores, jaundice, itching, dryness Cardiovascular  No chest pain, shortness of breath, or edema  Pulmonary  No cough or wheeze.  Gastro Intestinal  No nausea, vomitting, or diarrhoea. No bright red blood per rectum, no abdominal pain, change in bowel movement, or constipation.  Genito Urinary  No frequency, urgency, dysuria, Musculo Skeletal  No myalgia, arthralgia, joint swelling or pain  Neurologic  No weakness, numbness, change in gait,  Psychology  No depression, anxiety, insomnia.   Vitals:  Blood pressure 129/82, pulse 70, temperature 98.6 F (37 C), temperature source Oral, resp. rate 20, height 5' 3.5" (1.613 m), weight 263 lb 12.8 oz (119.7 kg), last menstrual period 01/09/2018, SpO2 100 %.  Physical Exam: WD in NAD Neck  deferred Lymph Node Survey deferred Cardiovascular  deferred Lungs  deferred Skin  No rash/lesions/breakdown  Psychiatry  Alert and oriented to person, place, and time  Abdomen  Morbidly obese. Unable to feel mass in abdomen. Well healed abdominal incisions (including RUQ incision) with no palpable hematomas.  Back deferred Genito Urinary  deferred Rectal  deferred Extremities  No bilateral cyanosis, clubbing or edema.   Thereasa Solo, MD   03/10/2018, 4:32 PM

## 2018-03-10 NOTE — Patient Instructions (Signed)
Please contact Dr Denman George at [phone] with questions.  Dr Denman George is referring you to gastroenterology to further evaluate your right pain.  You are cleared to return to work in 03/17/18.

## 2018-03-13 DIAGNOSIS — R410 Disorientation, unspecified: Secondary | ICD-10-CM | POA: Diagnosis not present

## 2018-03-14 ENCOUNTER — Other Ambulatory Visit: Payer: Self-pay | Admitting: Family Medicine

## 2018-03-14 ENCOUNTER — Encounter: Payer: Self-pay | Admitting: Internal Medicine

## 2018-03-14 DIAGNOSIS — R410 Disorientation, unspecified: Secondary | ICD-10-CM

## 2018-03-15 DIAGNOSIS — R229 Localized swelling, mass and lump, unspecified: Secondary | ICD-10-CM | POA: Diagnosis not present

## 2018-03-17 ENCOUNTER — Other Ambulatory Visit: Payer: Self-pay | Admitting: Orthopedic Surgery

## 2018-03-19 ENCOUNTER — Inpatient Hospital Stay
Admission: RE | Admit: 2018-03-19 | Discharge: 2018-03-19 | Disposition: A | Discharge status: Home / Self Care | Payer: BLUE CROSS/BLUE SHIELD | Source: Ambulatory Visit | Attending: Family Medicine | Admitting: Family Medicine

## 2018-04-01 ENCOUNTER — Inpatient Hospital Stay: Admission: RE | Admit: 2018-04-01 | Payer: BLUE CROSS/BLUE SHIELD | Source: Ambulatory Visit

## 2018-04-08 ENCOUNTER — Ambulatory Visit
Admission: RE | Admit: 2018-04-08 | Discharge: 2018-04-08 | Disposition: A | Discharge status: Home / Self Care | Payer: BLUE CROSS/BLUE SHIELD | Source: Ambulatory Visit | Attending: Family Medicine | Admitting: Family Medicine

## 2018-04-08 ENCOUNTER — Other Ambulatory Visit: Payer: Self-pay | Admitting: Family Medicine

## 2018-04-08 DIAGNOSIS — R410 Disorientation, unspecified: Secondary | ICD-10-CM

## 2018-04-09 ENCOUNTER — Other Ambulatory Visit: Payer: BLUE CROSS/BLUE SHIELD

## 2018-05-12 ENCOUNTER — Ambulatory Visit: Payer: BLUE CROSS/BLUE SHIELD | Admitting: Internal Medicine

## 2018-06-13 ENCOUNTER — Encounter: Payer: Self-pay | Admitting: Gynecologic Oncology

## 2018-06-27 ENCOUNTER — Ambulatory Visit (HOSPITAL_BASED_OUTPATIENT_CLINIC_OR_DEPARTMENT_OTHER): Admit: 2018-06-27 | Payer: BLUE CROSS/BLUE SHIELD | Admitting: Orthopedic Surgery

## 2018-06-27 ENCOUNTER — Encounter (HOSPITAL_BASED_OUTPATIENT_CLINIC_OR_DEPARTMENT_OTHER): Payer: Self-pay

## 2018-06-27 SURGERY — EXCISION MASS
Anesthesia: Regional | Laterality: Left

## 2018-10-12 ENCOUNTER — Ambulatory Visit
Admission: RE | Admit: 2018-10-12 | Discharge: 2018-10-12 | Disposition: A | Discharge status: Home / Self Care | Payer: BLUE CROSS/BLUE SHIELD | Source: Ambulatory Visit | Attending: Family Medicine | Admitting: Family Medicine

## 2018-10-12 ENCOUNTER — Other Ambulatory Visit: Payer: Self-pay | Admitting: Family Medicine

## 2018-10-12 DIAGNOSIS — J45909 Unspecified asthma, uncomplicated: Secondary | ICD-10-CM | POA: Diagnosis not present

## 2018-10-12 DIAGNOSIS — M25551 Pain in right hip: Secondary | ICD-10-CM | POA: Diagnosis not present

## 2018-10-12 DIAGNOSIS — D649 Anemia, unspecified: Secondary | ICD-10-CM | POA: Diagnosis not present

## 2018-10-12 DIAGNOSIS — M545 Low back pain, unspecified: Secondary | ICD-10-CM

## 2018-10-12 DIAGNOSIS — Z8673 Personal history of transient ischemic attack (TIA), and cerebral infarction without residual deficits: Secondary | ICD-10-CM | POA: Diagnosis not present

## 2018-10-12 DIAGNOSIS — S79911A Unspecified injury of right hip, initial encounter: Secondary | ICD-10-CM | POA: Diagnosis not present

## 2018-10-12 DIAGNOSIS — I10 Essential (primary) hypertension: Secondary | ICD-10-CM | POA: Diagnosis not present

## 2018-10-12 DIAGNOSIS — E119 Type 2 diabetes mellitus without complications: Secondary | ICD-10-CM | POA: Diagnosis not present

## 2018-11-14 ENCOUNTER — Other Ambulatory Visit: Payer: Self-pay | Admitting: Family Medicine

## 2018-11-14 DIAGNOSIS — N6489 Other specified disorders of breast: Secondary | ICD-10-CM

## 2018-12-01 ENCOUNTER — Ambulatory Visit
Admission: RE | Admit: 2018-12-01 | Discharge: 2018-12-01 | Disposition: A | Discharge status: Home / Self Care | Payer: BLUE CROSS/BLUE SHIELD | Source: Ambulatory Visit | Attending: Family Medicine | Admitting: Family Medicine

## 2018-12-01 DIAGNOSIS — N6489 Other specified disorders of breast: Secondary | ICD-10-CM

## 2018-12-01 DIAGNOSIS — R928 Other abnormal and inconclusive findings on diagnostic imaging of breast: Secondary | ICD-10-CM | POA: Diagnosis not present

## 2019-02-02 IMAGING — CR DG LUMBAR SPINE 2-3V
3 series · 3 of 3 positions shown · non-contrast
Comparison: CT abdomen pelvis dated August 05, 2015.

CLINICAL DATA: Low back pain for 2-3 weeks.

EXAM:
LUMBAR SPINE - 2-3 VIEW

[t l-spine a.p.]
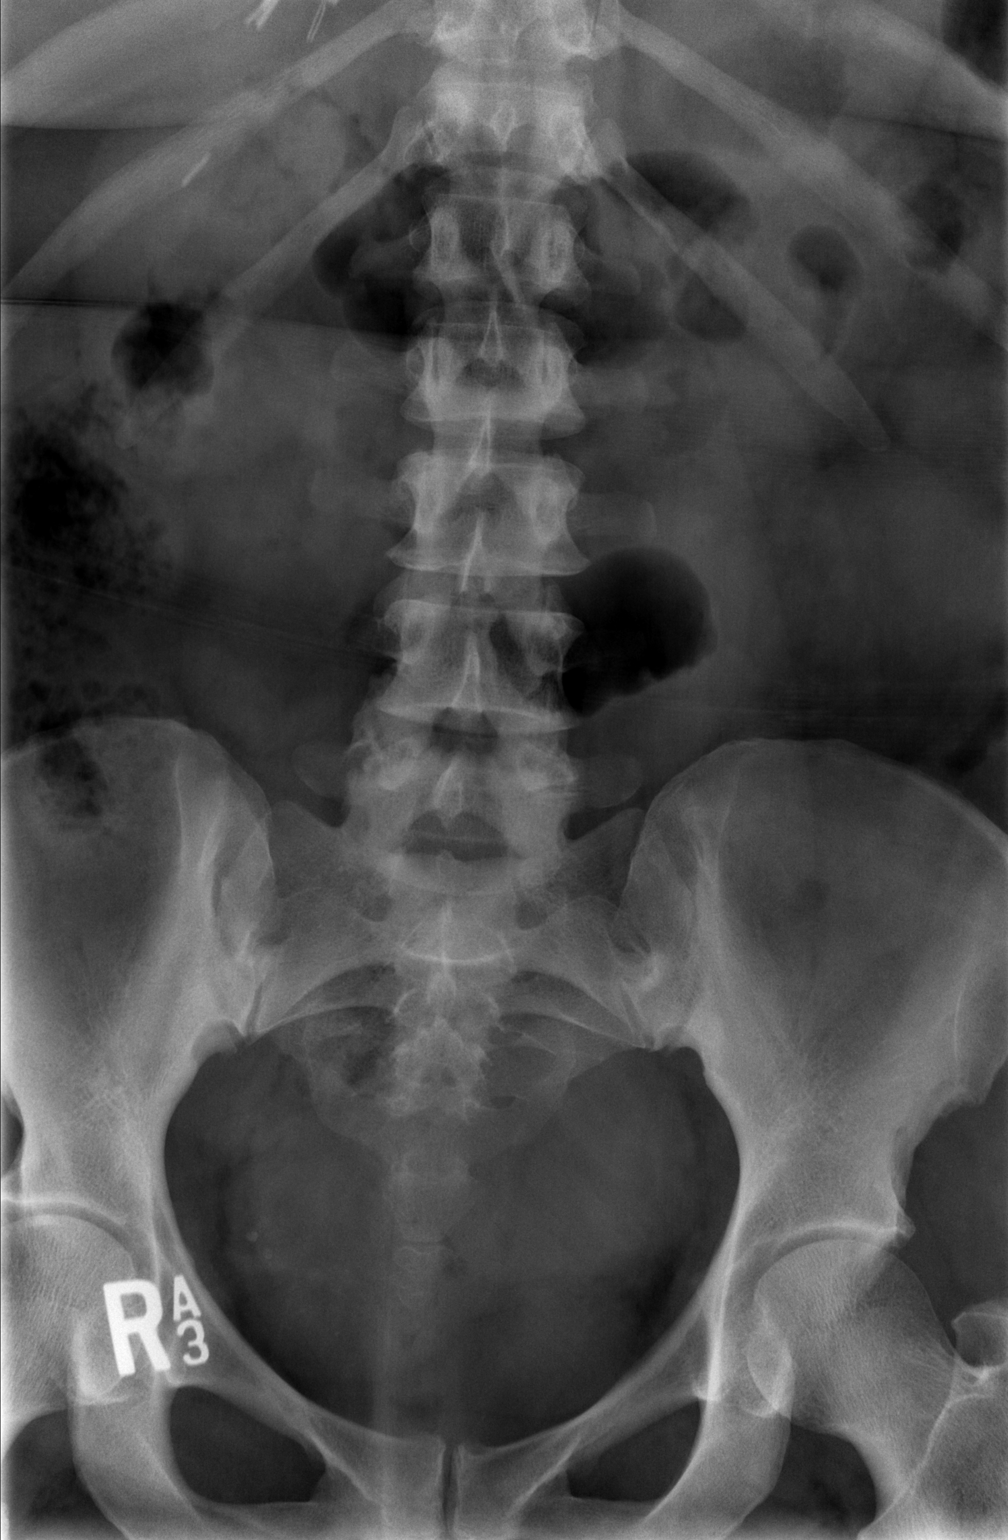

[t l-spine lat]
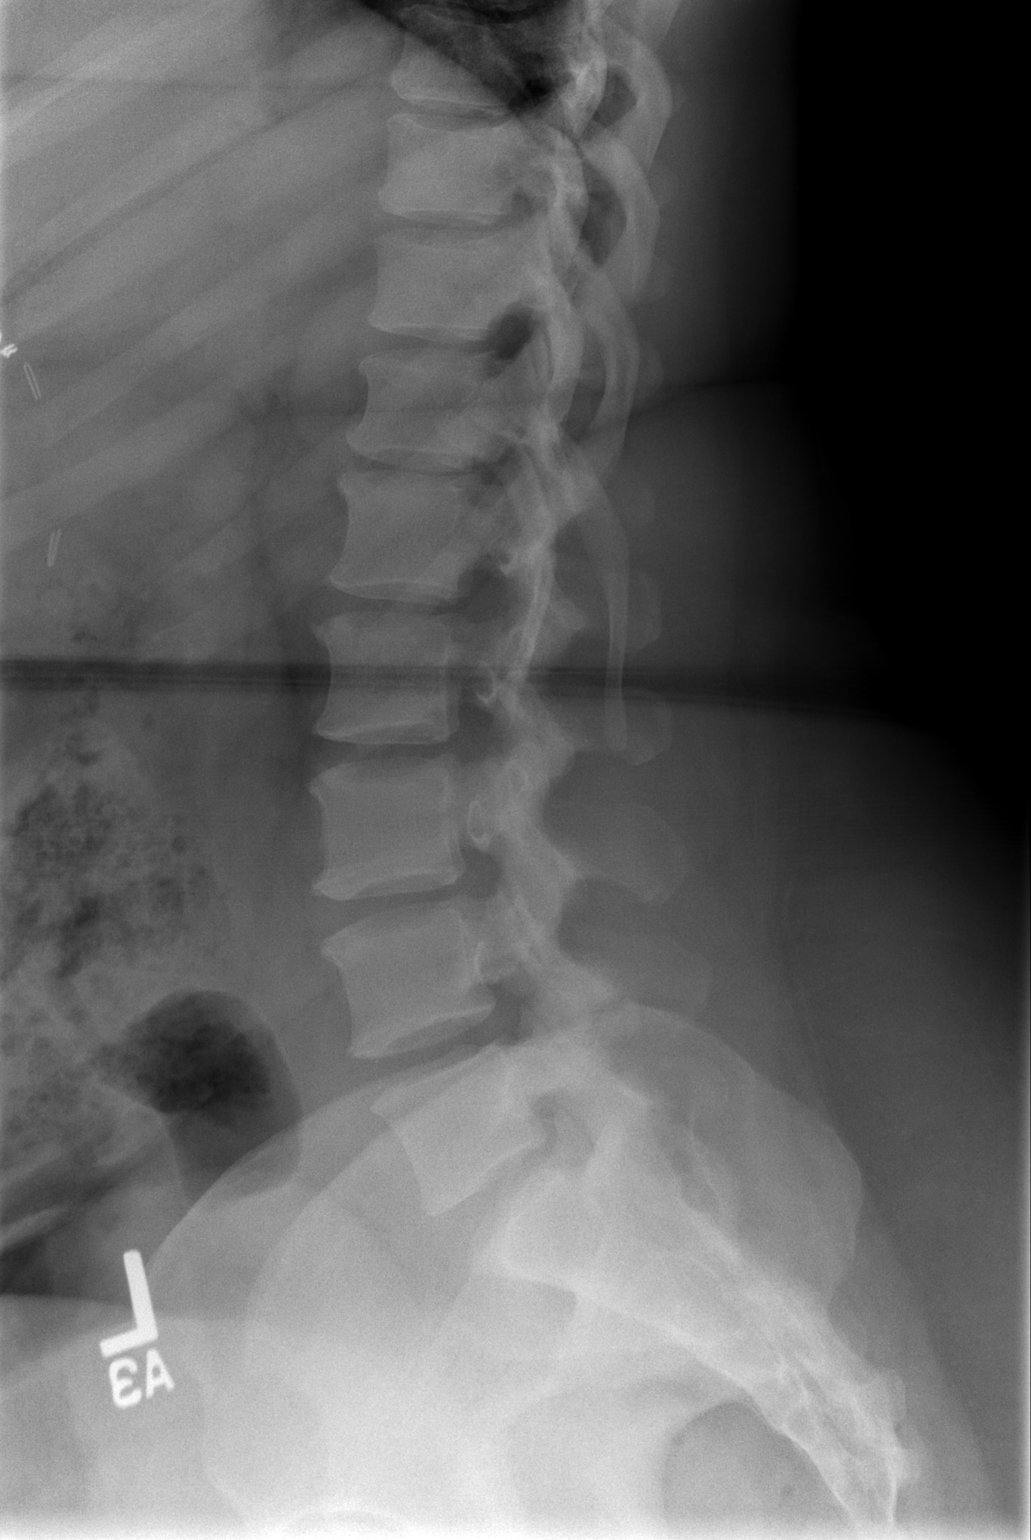

[t l-spine l5-s1 spot]
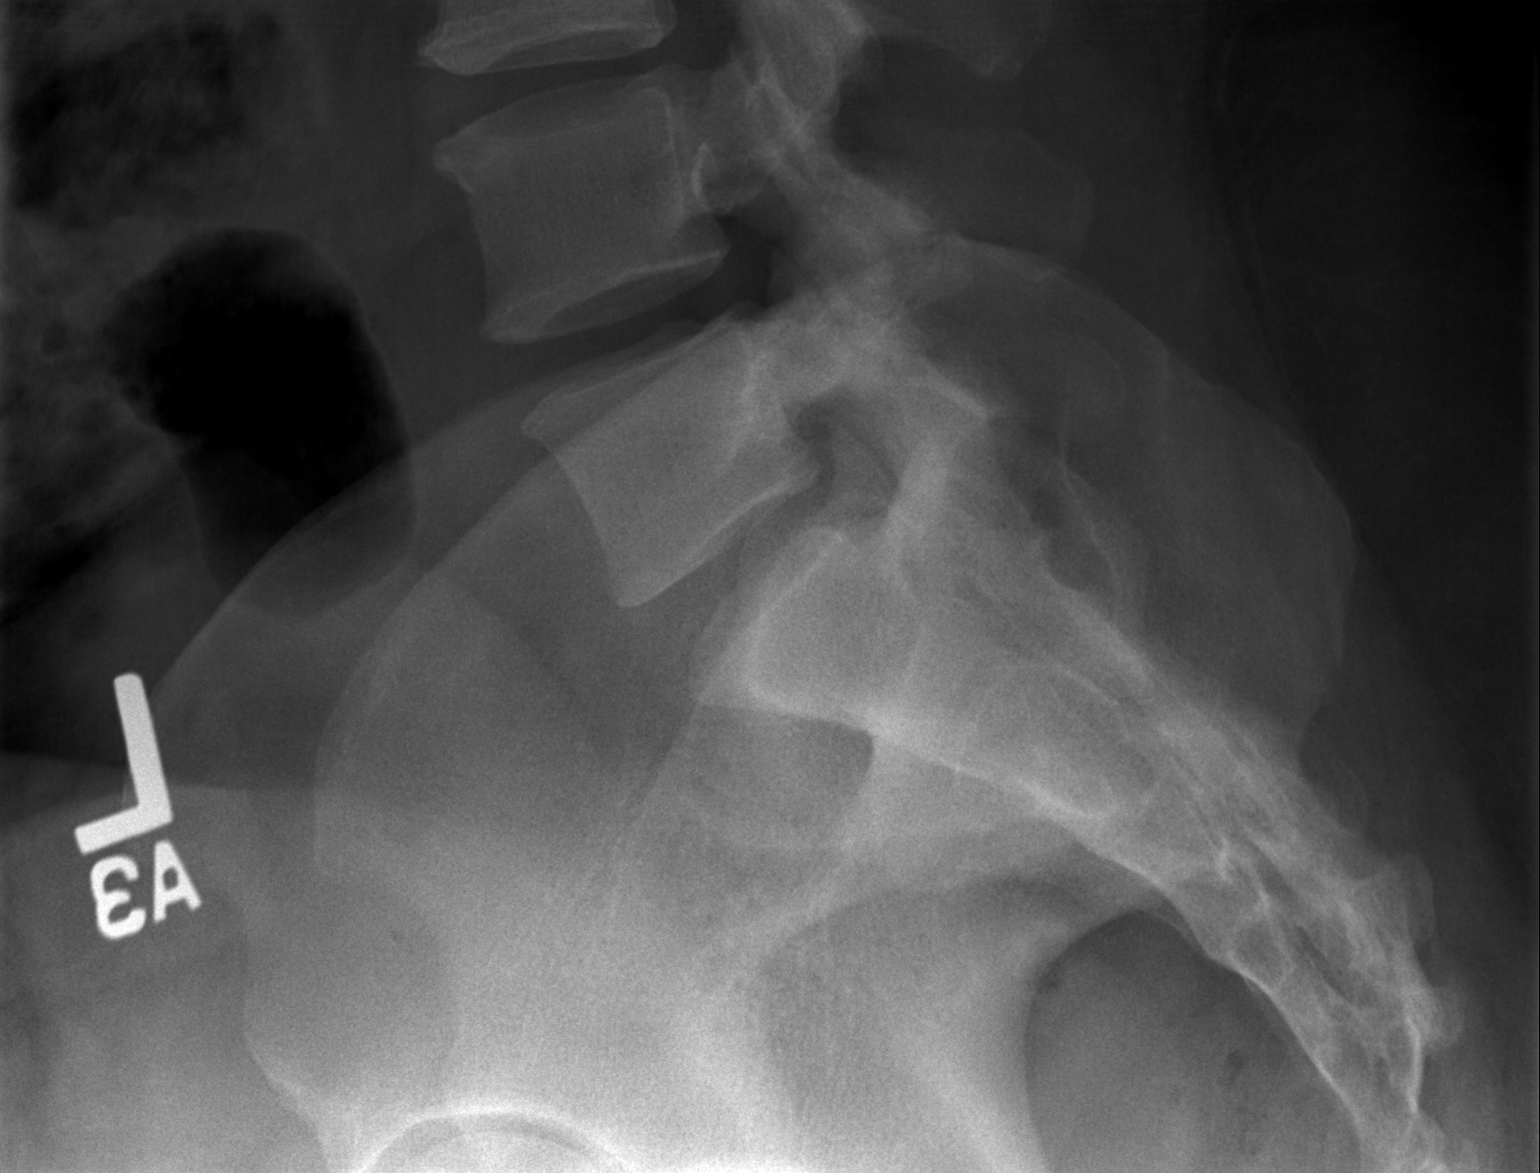

[3 of 3 positions shown; findings below may reference images not displayed]

FINDINGS: There are 5 lumbar type vertebral bodies. There is no lumbar spine
fracture or subluxation. Vertebral body heights are preserved.
Alignment is normal. Mild multilevel anterior endplate spurring.
Disc heights are relatively maintained. Bone mineralization is
normal.
IMPRESSION: Mild multilevel degenerative changes throughout the lumbar spine,
with relative preservation of the disc spaces.

## 2019-02-21 DIAGNOSIS — G4700 Insomnia, unspecified: Secondary | ICD-10-CM | POA: Diagnosis not present

## 2019-02-21 DIAGNOSIS — F411 Generalized anxiety disorder: Secondary | ICD-10-CM | POA: Diagnosis not present

## 2019-02-21 DIAGNOSIS — M79604 Pain in right leg: Secondary | ICD-10-CM | POA: Diagnosis not present

## 2019-02-21 DIAGNOSIS — N951 Menopausal and female climacteric states: Secondary | ICD-10-CM | POA: Diagnosis not present

## 2019-04-23 DIAGNOSIS — J45909 Unspecified asthma, uncomplicated: Secondary | ICD-10-CM | POA: Diagnosis not present

## 2019-04-23 DIAGNOSIS — I10 Essential (primary) hypertension: Secondary | ICD-10-CM | POA: Diagnosis not present

## 2019-04-23 DIAGNOSIS — E119 Type 2 diabetes mellitus without complications: Secondary | ICD-10-CM | POA: Diagnosis not present

## 2019-04-24 DIAGNOSIS — E119 Type 2 diabetes mellitus without complications: Secondary | ICD-10-CM | POA: Diagnosis not present

## 2019-04-26 ENCOUNTER — Ambulatory Visit (INDEPENDENT_AMBULATORY_CARE_PROVIDER_SITE_OTHER): Payer: BC Managed Care – PPO | Admitting: Obstetrics & Gynecology

## 2019-04-26 ENCOUNTER — Other Ambulatory Visit: Payer: Self-pay

## 2019-04-26 VITALS — BP 138/89 | HR 80 | Temp 98.0°F | Wt 265.9 lb

## 2019-04-26 DIAGNOSIS — N951 Menopausal and female climacteric states: Secondary | ICD-10-CM | POA: Diagnosis not present

## 2019-04-26 MED ORDER — VENLAFAXINE HCL ER 75 MG PO CP24: 150.0000 mg | ORAL_CAPSULE | Freq: Every day | ORAL | 12 refills | Status: AC

## 2019-04-26 NOTE — Progress Notes (Signed)
   Subjective:    Patient ID: Joanna Newman, female    DOB: [birth year], 48 y.o.   MRN: [id number]  HPI 48 yo married P3 (28, 90, and 63 yo kids, 11 grands) here today to discuss menopausal symptoms. They have gotten worse since she had her right ovary removed by Dr. Denman George last year. Pathology was benign. Her symptoms incude hot flashes, night sweats, mood swings, and no sex drive.  She was given effexor about 2 months ago for these symptoms. This has helped the mood swings but not the other symptoms.   Review of Systems She had a BTL. Her last period was 2/20. Her husband is a long distance Administrator. Monogamous for more than 20 years. Homemaker since Covid    Objective:   Physical Exam Breathing, conversing, and ambulating normally Well nourished, well hydrated Black female, no apparent distress Abd- benign     Assessment & Plan:  Menopausal symtoms - since effexor not working, I have offered HRT. Discussed risk of breast cancer. She doesn't want the increased risk, so I would rec increase the dose. Decreased libido- offer testoserone since Addyi would interfere with her wine that she likes. 2% test to be used 3 times per week. If no effect in a month, then rec stop the med and come back.

## 2019-08-22 ENCOUNTER — Other Ambulatory Visit: Payer: Self-pay

## 2019-08-22 DIAGNOSIS — R05 Cough: Secondary | ICD-10-CM | POA: Diagnosis not present

## 2019-08-22 DIAGNOSIS — Z20822 Contact with and (suspected) exposure to covid-19: Secondary | ICD-10-CM

## 2019-08-22 DIAGNOSIS — R52 Pain, unspecified: Secondary | ICD-10-CM | POA: Diagnosis not present

## 2019-08-25 LAB — NOVEL CORONAVIRUS, NAA: SARS-CoV-2, NAA: NOT DETECTED

## 2019-10-31 ENCOUNTER — Other Ambulatory Visit: Payer: Self-pay | Admitting: Family Medicine

## 2019-10-31 DIAGNOSIS — Z1231 Encounter for screening mammogram for malignant neoplasm of breast: Secondary | ICD-10-CM

## 2019-12-07 ENCOUNTER — Ambulatory Visit: Payer: BC Managed Care – PPO

## 2020-01-11 ENCOUNTER — Ambulatory Visit: Payer: Self-pay

## 2020-01-16 ENCOUNTER — Ambulatory Visit
Admission: RE | Admit: 2020-01-16 | Discharge: 2020-01-16 | Disposition: A | Discharge status: Home / Self Care | Payer: 59 | Source: Ambulatory Visit | Attending: Family Medicine | Admitting: Family Medicine

## 2020-01-16 ENCOUNTER — Other Ambulatory Visit: Payer: Self-pay

## 2020-01-16 DIAGNOSIS — Z1231 Encounter for screening mammogram for malignant neoplasm of breast: Secondary | ICD-10-CM

## 2020-03-31 ENCOUNTER — Telehealth (INDEPENDENT_AMBULATORY_CARE_PROVIDER_SITE_OTHER): Payer: 59 | Admitting: General Practice

## 2020-03-31 DIAGNOSIS — B373 Candidiasis of vulva and vagina: Secondary | ICD-10-CM

## 2020-03-31 NOTE — Telephone Encounter (Signed)
Patient called and left message stating she is returning our phone call. Patient is requesting a call back.

## 2020-03-31 NOTE — Telephone Encounter (Signed)
Patient called and left message on nurse voicemail line stating she is a patient of Dr Alease Medina as she is in menopause. Patient states she is having vaginal discomfort and itching. She is not sexually active. Patient requests a prescription or OTC recommendation. Called patient, no answer- left message stating we are trying to reach you to return your phone call, please call us back if you still need assistance.

## 2020-04-01 NOTE — Telephone Encounter (Signed)
Returned patient's phone call. She reports vaginal itching/irritation for several days now. States she saw her doctor yesterday and was given an antibiotic and Diflucan. Patient states she is a patient of Dr Alease Medina and has a history of menopause symptoms. Patient does report wearing a liner for occasional leakage of urine. Discussed with patient it sounds like she has a yeast infection. Advised she use monistat in the meantime until she finishes her antibiotic then she can take the diflucan. Also discussed changing liner frequently. Patient verbalized understanding & asked if she was due for a pap smear. Told patient yes and I would send a message to the front office staff to reach out to her to schedule an annual exam. Patient verbalized understanding & had no questions.

## 2020-04-21 ENCOUNTER — Telehealth: Payer: Self-pay | Admitting: Family Medicine

## 2020-04-21 NOTE — Telephone Encounter (Signed)
Patient is requesting a call back from the nurse. She needs something called in for her irritation.

## 2020-04-23 ENCOUNTER — Telehealth: Payer: Self-pay | Admitting: Family Medicine

## 2020-04-23 NOTE — Telephone Encounter (Signed)
Patient called into the office upset because she has not heard back from the nurses yet. Patient stated that she left a message for someone for call her back a couple of days ago and still has not heard anything back. Patient stated that she is not happy with the care that she is receiving her because it feels like if you are not pregnant or being seen across the street they do not care about you. Patient stated that this has happened in the past and now she would just like to transfer her care to another office. Patient was given information for our sister offices, [place], [place]. Patient requested that her August appointment with this office be canceled because she will not be coming back. Appointment was canceled per patient request.

## 2020-04-24 NOTE — Telephone Encounter (Signed)
I called patient back and left a message I was returning her call and she may call us back if she is still having an issue. Per chart review do see that she called back again upset no one had called and cancelled her next appt and is going to transfer care to a sister office although I do not see an appointment yet. Kesha Hurrell,RN

## 2020-05-02 ENCOUNTER — Encounter: Payer: Self-pay | Admitting: Obstetrics and Gynecology

## 2020-05-02 ENCOUNTER — Ambulatory Visit (INDEPENDENT_AMBULATORY_CARE_PROVIDER_SITE_OTHER): Payer: 59 | Admitting: Obstetrics and Gynecology

## 2020-05-02 VITALS — BP 162/94 | HR 72 | Ht 64.0 in | Wt 288.2 lb

## 2020-05-02 DIAGNOSIS — N9069 Other specified hypertrophy of vulva: Secondary | ICD-10-CM | POA: Diagnosis not present

## 2020-05-02 DIAGNOSIS — Z6841 Body Mass Index (BMI) 40.0 and over, adult: Secondary | ICD-10-CM | POA: Diagnosis not present

## 2020-05-02 MED ORDER — FLUTICASONE PROPIONATE 0.05 % EX CREA: TOPICAL_CREAM | Freq: Two times a day (BID) | CUTANEOUS | 0 refills | Status: DC

## 2020-05-02 MED ORDER — CLOBETASOL PROPIONATE 0.05 % EX CREA: TOPICAL_CREAM | Freq: Two times a day (BID) | CUTANEOUS | Status: DC

## 2020-05-02 NOTE — Addendum Note (Signed)
Addended by: Jonnie Kind on: 05/02/2020 04:16 PM   Modules accepted: Orders

## 2020-05-02 NOTE — Progress Notes (Signed)
Patient ID: Joanna Newman, female   DOB: 08-20-1971, 49 y.o.   MRN: [id number]    [facility] Visit  @DATE @            Patient name: Joanna Newman MRN [id number]  Date of birth: 01-28-1971  CC & HPI:  Joanna Newman is a 49 y.o. female NEW PATIENT presenting today for vaginal itchiness and recurrent yeast infections. She had a right salpingo oophorectomy on 02/14/2018 for a large benign cystadenofibroma, performed by Dr. Everitt Amber and her periods have been irregular since then. She did not have a period for 10 months, and then had another one right before she came in today. She has been using Monistat cream, which does not provide her the relief she wants. She is on Effexor to help with hot sweats. She feels that she is perimenopausal. The patient has a partner but they have not had intercourse in years.  The patient wears a sanitary pad everyday because she has incontinence sometimes when she sneezes or coughs.  The patient has been trying to exercise more and has a stationary bike at home. The patient denies fever, chills or any other symptoms or complaints at this time.   ROS:  ROS  + hot sweats, on Effexor + irregular periods + vaginal itching + incontinence - fever - chills All systems are negative except as noted in the HPI and PMH.   Pertinent History Reviewed:   Reviewed: Medical         Past Medical History:  Diagnosis Date  . Anxiety   . Arthritis   . Asthma   . Bilateral ovarian cysts   . Depression   . Diabetes mellitus without complication ([place])   . Epidermoid cyst of finger    tip of left little finger  . Fibroids   . GERD (gastroesophageal reflux disease)   . Hyperlipidemia   . Hypertension   . Insomnia   . LVH (left ventricular hypertrophy)    Moderate  . Obesity   . Pain of right heel   . Sleep apnea    History of no problems since weight loss  . Stroke ([place]) 07/2016   No residual  . Substance abuse ([place])     cocaine, marijuana and alcohol abuse. quit end of 2012                              Surgical Hx:    Past Surgical History:  Procedure Laterality Date  . CHOLECYSTECTOMY    . HEEL SPUR SURGERY Bilateral   . KNEE SURGERY    . LAPAROTOMY WITH STAGING N/A 02/14/2018   Procedure: MINI-LAPAROTOMY;  Surgeon: Everitt Amber, MD;  Location: [facility];  Service: Gynecology;  Laterality: N/A;  . LYSIS OF ADHESION  02/14/2018   Procedure: LYSIS OF ADHESION;  Surgeon: Everitt Amber, MD;  Location: [facility];  Service: Gynecology;;  . ROBOTIC ASSISTED TOTAL HYSTERECTOMY WITH BILATERAL SALPINGO OOPHERECTOMY N/A 02/14/2018   Procedure: XI ROBOTIC ASSISTED RIGHT SALPINGO OOPHORECTOMY, OMENTECTOMY;  Surgeon: Everitt Amber, MD;  Location: [facility];  Service: Gynecology;  Laterality: N/A;  . TUBAL LIGATION     Medications: Reviewed & Updated - see associated section                       Current Outpatient Medications:  .  amLODipine (NORVASC) 2.5 MG tablet, Take 1 tablet (2.5 mg total) by  mouth daily., Disp: 30 tablet, Rfl: 0 .  aspirin EC 325 MG EC tablet, Take 1 tablet (325 mg total) by mouth daily., Disp: , Rfl:  .  atorvastatin (LIPITOR) 20 MG tablet, Take 1 tablet (20 mg total) by mouth daily at 6 PM., Disp: 30 tablet, Rfl: 0 .  BLACK CURRANT SEED OIL PO, Take 1,000 mg by mouth daily., Disp: , Rfl:  .  diclofenac (VOLTAREN) 75 MG EC tablet, Take 75 mg by mouth 2 (two) times daily., Disp: , Rfl:  .  HYDROCORTISONE EX, Apply 1 application topically 2 (two) times daily as needed (for eczema.)., Disp: , Rfl:  .  metFORMIN (GLUCOPHAGE) 1000 MG tablet, Take 500 mg by mouth 2 (two) times daily. , Disp: , Rfl:  .  montelukast (SINGULAIR) 10 MG tablet, Take 10 mg by mouth daily., Disp: , Rfl:  .  venlafaxine XR (EFFEXOR-XR) 75 MG 24 hr capsule, Take 2 capsules (150 mg total) by mouth daily with breakfast. (Patient taking differently: Take 75 mg by mouth daily with breakfast. ), Disp: 60 capsule, Rfl: 12   Social History: Reviewed  -  reports that she quit smoking about 6 years ago. Her smoking use included cigarettes. She has a 3.75 pack-year smoking history. She has never used smokeless tobacco.  Objective Findings:  Vitals: Blood pressure (!) 162/94, pulse 72, height 5\' 4"  (1.626 m), weight (!) 288 lb 3.2 oz (130.7 kg), last menstrual period 04/19/2020.  PHYSICAL EXAMINATION General appearance - alert, well appearing, and in no distress, oriented to person, place, and time and overweight Mental status - alert, oriented to person, place, and time, normal mood, behavior, speech, dress, motor activity, and thought processes, affect appropriate to mood HEENT - Geographic tongue  PELVIC External genitalia - Chronic moisture trapping. Lichenification. Vagina - Tissues slightly thinned out. Minimal bleeding. Uterus - Tilts posteriorly, slightly enlarged  Assessment & Plan:   A:  1. Vulvar dystrophy, hyperplastic 2. S/p right salpingo oophorectomy in 2019 3. Morbid Obesity Body mass index is 49.47 kg/m.  4.   P:  1. Rx Clobetasol 2. Recommend using Monistat twice weekly and keeping area dry.,  Dry vulva regimen recommendations reviewed 3. F/U in 6 weeks  By signing my name below, I, De Burrs, attest that this documentation has been prepared under the direction and in the presence of Jonnie Kind, MD. Electronically Signed: De Burrs, Medical Scribe. 05/02/20. 8:47 AM.  I personally performed the services described in this documentation, which was SCRIBED in my presence. The recorded information has been reviewed and considered accurate. It has been edited as necessary during review. Jonnie Kind, MD

## 2020-05-26 ENCOUNTER — Ambulatory Visit: Payer: 59 | Admitting: Family Medicine

## 2020-07-04 ENCOUNTER — Other Ambulatory Visit: Payer: Self-pay

## 2020-07-04 ENCOUNTER — Encounter: Payer: Self-pay | Admitting: Obstetrics and Gynecology

## 2020-07-04 ENCOUNTER — Ambulatory Visit (INDEPENDENT_AMBULATORY_CARE_PROVIDER_SITE_OTHER): Payer: 59 | Admitting: Obstetrics and Gynecology

## 2020-07-04 VITALS — BP 147/101 | HR 75 | Ht 64.0 in | Wt 291.4 lb

## 2020-07-04 DIAGNOSIS — N9069 Other specified hypertrophy of vulva: Secondary | ICD-10-CM

## 2020-07-04 NOTE — Patient Instructions (Addendum)
Put a note on your fridge that says: "Why are you here?".  Increase water intake. Drink 8oz of water and wait 3 minutes prior to eating.  Increase exercise regimen, even if only by a few minutes a day.  Weight loss guidelines and tips   1. Utilize measuring cups and spoons (found at a low price at [facility]) to monitor and manage serving sizes. Use them to carefully measure out serving sizes listed on food packaging to prevent overeating and better manage caloric intake. Most of [place] tell ourselves lies about how much a serving really is. For example, a serving of meat is actually 4 oz, which is the size of a deck of cards!!!   2. Utilize smart phone apps like "My Net Diary", "My Fitness Pal", "Lose It", or "Eat Better" to keep track of calorie intake and exercise. Also consider purchasing a pedometer to keep track of daily steps. Some smart phones have built in pedometers to keep track of step counts (your goal is 10,000 steps per day).   3. Make sure you are consuming enough water daily. Drink 8 oz prior to meals, or at snack time, to cut down on overeating.   4. Consider exercise programs. [employer] offers water aerobics classes that are low-impact to minimize joint pains while still burning calories. For maximum impact at home, do small exercises while watching TV during commercial breaks.   The Truth Table:   3,500 calories = 1 pound of fat  Minus 500 calories a day x 7 days will lose you 1 pound  Exercise helps, but one mile walk = 200 calories burned   10,000 steps is your goal for the day, that's 4-5 miles!   *Please cut out and put on your refrigerator*   PLEASE GO READ THESE VERY SIMPLE ARTICLES ON THE BENEFITS OF WEIGHT LOSS, RELEASING TOXINS, AND THE OTHER THINGS FAT CELLS  "HOARD"  MassJudge.si  http://www.garrett.info/    ITrackBites app for weight loss management  Patient Education   Weight Loss Diet  About this topic  There are many "trendy" weight loss diets that are popular today. Many of these diets can end up being more harmful than helpful. The healthiest way to lose weight is to burn more calories than you eat.  A weight loss diet should help you have a healthy view of eating. It is NOT healthy to stop eating to try and lose weight. A good diet plan will help you cut down your food intake and make healthy choices.  A healthy weight loss goal is 1 to 2 pounds (0.5 to 1 kg) per week. It takes 3500 calories to lose 1 pound (0.5 kg). That means cutting out 500 calories every day for 7 days. You can lose these 500 calories per day by eating fewer calories, burning them through exercise, or both.  It may be easier than you think to cut 500 calories from your daily intake. You can:   Switch from whole milk to 1% or skim milk   Switch from regular cheese to fat-free cheese   Use healthier condiment choices  ? Fat-free sour cream or salad dressings  ? Spray butter  ? Diet syrups or jellies over regular   Try frozen yogurt as a dessert rather than eating ice cream.   Skip the chips. Snack on carrots, vegetables, or fruit. If chips are a favorite of yours, try the baked style.   Eat boiled, seared fish or skinless chicken rather than red meat.  Try flavored no-calorie waters. Do not drink soda and juices that have many calories.  General   Eating smaller meals more often may be helpful. This will help keep your metabolism high and your hunger low. This will keep you from overeating at your next meal. Also, eating meals slowly helps you feel full faster.  ?  If eating 3 meals is a part of your lifestyle, choose more low-fat proteins and higher fiber to fill you up at each meal. Skip the snack at night if you can. Drink a calorie-free beverage instead.   Do not skip meals. Most often if you skip a meal, you eat too much at the next meal.   Eat smaller portions. Use a smaller plate or bowl for meals, and when you are eating out, eat half and take the rest home.   Plan ahead. Plan your meals and grocery list before going to the store. Planning will keep you from getting meals from fast foods or restaurants.   Do not go to the grocery store hungry. You are more likely to buy snacks that are not good for you.   Portion out snacks. When you are having a snack, instead of grabbing the whole bag, portion a small amount out to give yourself a stopping point.   Drink water before and after your meals to help fill you up without the calories.   When eating starchy foods, choose whole-grain products. These have a lot of fiber which will make you feel full. Fiber also helps lower cholesterol and helps with bowel function.   If you need a helpful start, ask your doctor to send you to a dietitian for weight loss help.    What will the results be?  Losing excess weight will make your whole body healthier. You will have more energy for your daily activities and lower your risk for health problems.  What lifestyle changes are needed?  Stay active. Eating healthy is not always enough to lose weight. Burning calories by exercising is a big part of weight loss.  What foods are good to eat?  The key is to watch your portion sizes. It is best to choose foods that are lower in fat and calories.   Choose low-fat meats:  ? Boneless chicken breast  ? Pork loin  ? 90% lean beef  ? Lean [place] meat  ? Fresh fish (not fried)   Choose low-fat dairy products:  ? 1% or skim milk  ? Spray butter or margarine  ? Low-fat or fat-free cheese  ? Frozen yogurt or low  calorie ice cream   Choose fresh fruits, green vegetables, beans and lentils, and whole wheat products more often.   Choose smart snacks:  ? Baked chips  ? Pretzels  ? Popcorn with no butter ? use pepper, garlic, or another spice to taste  ? High fiber, low-fat crackers  ? Reduced fat cookies  ? Diet or no-calorie beverages  What foods should be limited or avoided?  Limit high-fat, high-sodium, and high-calorie foods like:   Fried foods   Processed meats   Whole-fat dairy products   Candy, cookies, chips, pastries   Sausage, bacon, any full-fat meats   Soda, juice   Beer, wine, and mixed drinks (alcohol)  Will there be any other care needed?  What do I do first before trying to lose weight?   Talk to your doctor and dietitian to see if you need to lose weight. Work with them to set  your weight loss goals.   If you have a chronic illness, such as high blood sugar or high blood pressure, ask a doctor or dietitian what diet and exercise is right for you.   Ask your doctor about how much you are able to exercise and what type of exercise is good for you.  Helpful tips   Keep a food journal to help keep you on track.   Do not eat 2 hours before bedtime.   Join a support group.  Tips for burning calories:   If your workplace is near your house, choose to walk or bike to work instead of driving.   Take 20-minute walks each day. Walk around during your lunch break. You will not only burn calories, but raise your energy for the rest of the day.   Take the stairs over the elevators.   Join a gym or exercise class with a friend.   Try to exercise 30 minutes a day. Three 10 minute sessions works too.   Drink lots of water before, during, and after exercise.  Where can I learn more?  CashmereCloseouts.hu  CheerfulGuy.tn  CashmereCloseouts.hu  SharpAnalyst.uy   Weight-Control Information Network  PreferredVet.ca  Weight-Control Information Network  http://www.white-smith.com/  Last Reviewed Date  2015-07-08  Consumer Information Use and Disclaimer  This information is not specific medical advice and does not replace information you receive from your health care provider. This is only a brief summary of general information. It does NOT include all information about conditions, illnesses, injuries, tests, procedures, treatments, therapies, discharge instructions or life-style choices that may apply to you. You must talk with your health care provider for complete information about your health and treatment options. This information should not be used to decide whether or not to accept your health care provider's advice, instructions or recommendations. Only your health care provider has the knowledge and training to provide advice that is right for you.  Copyright  Copyright  2019 [employer] and its affiliates and/or licensors. All rights reserved.

## 2020-07-04 NOTE — Progress Notes (Signed)
[facility] Visit  @DATE @            Patient name: Joanna Newman MRN [id number]  Date of birth: [birth year]  CC & HPI:  Joanna Newman is a 49 y.o. female presenting today for follow-up after being seen for hyperplastic vulvar dystrophy and recurrent yeast infections. She was seen on 05/02/2020 and was prescribed Clobetasol. She was also advised to use Monistat twice weekly and keep vulva dry.  Today, she reports feeling well and states that her symptoms have mostly resolved. She does report some very mild vaginal itching.  ROS:  ROS +vaginal itching All systems are negative except as noted in the HPI and PMH.  Pertinent History Reviewed:   Reviewed Medical         Past Medical History:  Diagnosis Date  . Anxiety   . Arthritis   . Asthma   . Bilateral ovarian cysts   . Depression   . Diabetes mellitus without complication ([place])   . Epidermoid cyst of finger    tip of left little finger  . Fibroids   . GERD (gastroesophageal reflux disease)   . Hyperlipidemia   . Hypertension   . Insomnia   . LVH (left ventricular hypertrophy)    Moderate  . Obesity   . Pain of right heel   . Sleep apnea    History of no problems since weight loss  . Stroke ([place]) 07/2016   No residual  . Substance abuse ([place])    cocaine, marijuana and alcohol abuse. quit end of 2012                              Surgical Hx:    Past Surgical History:  Procedure Laterality Date  . CHOLECYSTECTOMY    . HEEL SPUR SURGERY Bilateral   . KNEE SURGERY    . LAPAROTOMY WITH STAGING N/A 02/14/2018   Procedure: MINI-LAPAROTOMY;  Surgeon: Everitt Amber, MD;  [facility]: [facility];  Service: Gynecology;  Laterality: N/A;  . LYSIS OF ADHESION  02/14/2018   Procedure: LYSIS OF ADHESION;  Surgeon: Everitt Amber, MD;  Location: [facility];  Service: Gynecology;;  . ROBOTIC ASSISTED TOTAL HYSTERECTOMY WITH BILATERAL SALPINGO OOPHERECTOMY N/A 02/14/2018   Procedure: XI ROBOTIC ASSISTED RIGHT SALPINGO  OOPHORECTOMY, OMENTECTOMY;  Surgeon: Everitt Amber, MD;  Location: [facility];  Service: Gynecology;  Laterality: N/A;  . TUBAL LIGATION     Medications: Reviewed & Updated - see associated section                       Current Outpatient Medications:  .  albuterol (VENTOLIN HFA) 108 (90 Base) MCG/ACT inhaler, Inhale 2 puffs into the lungs every 4 (four) hours as needed., Disp: , Rfl:  .  amLODipine (NORVASC) 2.5 MG tablet, Take 1 tablet (2.5 mg total) by mouth daily., Disp: 30 tablet, Rfl: 0 .  aspirin EC 325 MG EC tablet, Take 1 tablet (325 mg total) by mouth daily., Disp: , Rfl:  .  atorvastatin (LIPITOR) 20 MG tablet, Take 1 tablet (20 mg total) by mouth daily at 6 PM., Disp: 30 tablet, Rfl: 0 .  BLACK CURRANT SEED OIL PO, Take 1,000 mg by mouth daily., Disp: , Rfl:  .  diclofenac (VOLTAREN) 75 MG EC tablet, Take 75 mg by mouth 2 (two) times daily., Disp: , Rfl:  .  fluticasone (CUTIVATE) 0.05 % cream, Apply topically  2 (two) times daily. For one week then daily, gradually taper use, Disp: 30 g, Rfl: 0 .  HYDROCORTISONE EX, Apply 1 application topically 2 (two) times daily as needed (for eczema.)., Disp: , Rfl:  .  metFORMIN (GLUCOPHAGE) 1000 MG tablet, Take 500 mg by mouth 2 (two) times daily. , Disp: , Rfl:  .  montelukast (SINGULAIR) 10 MG tablet, Take 10 mg by mouth daily., Disp: , Rfl:  .  venlafaxine XR (EFFEXOR-XR) 75 MG 24 hr capsule, Take 2 capsules (150 mg total) by mouth daily with breakfast. (Patient taking differently: Take 75 mg by mouth daily with breakfast. ), Disp: 60 capsule, Rfl: 12  Current Facility-Administered Medications:  .  clobetasol cream (TEMOVATE) 0.05 %, , Topical, BID, Glo Herring, Angelyn Punt, MD   Social History: Reviewed -  reports that she quit smoking about 6 years ago. Her smoking use included cigarettes. She has a 3.75 pack-year smoking history. She has never used smokeless tobacco.  Objective Findings:  Vitals: Blood pressure (!) 147/101, pulse 75, height 5\' 4"   (1.626 m), weight 291 lb 6.4 oz (132.2 kg).  PHYSICAL EXAMINATION General appearance - alert, well appearing, and in no distress Mental status - alert, oriented to person, place, and time, normal mood, behavior, speech, dress, motor activity, and thought processes  PELVIC External genitalia - normal appearing No chronic moisture changes. No excoriations.   Assessment & Plan:   A:  1. Hyperplastic vulvar dystrophy  2. Morbid obesity with BMI of 50.02%   P:  1. Much improved. Advised patient to continue keeping the vulvar area dry 2. Discussed weight loss, calorie adjustment, increasing water intake, motivation, and spiritual guidance   By signing my name below, I, Clerance Lav, attest that this documentation has been prepared under the direction and in the presence of Jonnie Kind, MD. Electronically Signed: [place]. 07/04/20. 9:00 AM.  I personally performed the services described in this documentation, which was SCRIBED in my presence. The recorded information has been reviewed and considered accurate. It has been edited as necessary during review. Jonnie Kind, MD

## 2021-01-26 ENCOUNTER — Ambulatory Visit (INDEPENDENT_AMBULATORY_CARE_PROVIDER_SITE_OTHER): Payer: 59

## 2021-01-26 ENCOUNTER — Ambulatory Visit
Admission: EM | Admit: 2021-01-26 | Discharge: 2021-01-26 | Disposition: A | Discharge status: Home / Self Care | Payer: 59 | Attending: Internal Medicine | Admitting: Internal Medicine

## 2021-01-26 ENCOUNTER — Other Ambulatory Visit: Payer: Self-pay

## 2021-01-26 ENCOUNTER — Encounter: Payer: Self-pay | Admitting: Emergency Medicine

## 2021-01-26 DIAGNOSIS — S96911A Strain of unspecified muscle and tendon at ankle and foot level, right foot, initial encounter: Secondary | ICD-10-CM | POA: Diagnosis not present

## 2021-01-26 DIAGNOSIS — M25571 Pain in right ankle and joints of right foot: Secondary | ICD-10-CM

## 2021-01-26 MED ORDER — FLUTICASONE PROPIONATE 0.05 % EX CREA: TOPICAL_CREAM | Freq: Two times a day (BID) | CUTANEOUS | 0 refills | Status: AC

## 2021-01-26 MED ORDER — DICLOFENAC SODIUM 1 % EX GEL: 2.0000 g | Freq: Four times a day (QID) | CUTANEOUS | 0 refills | Status: DC

## 2021-01-26 NOTE — ED Provider Notes (Signed)
[facility]    CSN: [id number] Arrival date & time: 01/26/21  1115      History   Chief Complaint No chief complaint on file.   HPI Joanna Newman is a 50 y.o. female with R ankle pain from running around with her grandkids 3 days ago. Has alternated ice and heat and been taking Tylenol, but is not getting better. She has to walk a long distance from the parking lot to her office and does not think she can make it. Denies paresthesia or edema.     Past Medical History:  Diagnosis Date  . Anxiety   . Arthritis   . Asthma   . Bilateral ovarian cysts   . Depression   . Diabetes mellitus without complication ([place])   . Epidermoid cyst of finger    tip of left little finger  . Fibroids   . GERD (gastroesophageal reflux disease)   . Hyperlipidemia   . Hypertension   . Insomnia   . LVH (left ventricular hypertrophy)    Moderate  . Obesity   . Pain of right heel   . Sleep apnea    History of no problems since weight loss  . Stroke ([place]) 07/2016   No residual  . Substance abuse ([place])    cocaine, marijuana and alcohol abuse. quit end of 2012    Patient Active Problem List   Diagnosis Date Noted  . Pelvic mass in female 02/14/2018  . Ovarian torsion 12/31/2017  . Ovarian cyst 12/31/2017  . Impingement of right ankle joint 04/05/2017  . Acute CVA (cerebrovascular accident) ([place]) 08/06/2016  . Paresthesia 08/04/2016  . Hyponatremia 08/04/2016  . Essential hypertension 12/25/2014  . Insomnia 12/25/2014  . Asthma, chronic 12/25/2014  . Morbid obesity ([place]) 12/25/2014  . Bilateral ovarian cysts 12/16/2014  . Diabetes ([place]) 12/16/2014  . Morbid obesity with BMI of 50.0-59.9, adult ([place]) 05/20/2012  . Vaginal itching 05/20/2012    Past Surgical History:  Procedure Laterality Date  . CHOLECYSTECTOMY    . HEEL SPUR SURGERY Bilateral   . KNEE SURGERY    . LAPAROTOMY WITH STAGING N/A 02/14/2018   Procedure: MINI-LAPAROTOMY;  Surgeon: Everitt Amber,  MD;  Location: [facility];  Service: Gynecology;  Laterality: N/A;  . LYSIS OF ADHESION  02/14/2018   Procedure: LYSIS OF ADHESION;  Surgeon: Everitt Amber, MD;  Location: [facility];  Service: Gynecology;;  . ROBOTIC ASSISTED TOTAL HYSTERECTOMY WITH BILATERAL SALPINGO OOPHERECTOMY N/A 02/14/2018   Procedure: XI ROBOTIC ASSISTED RIGHT SALPINGO OOPHORECTOMY, OMENTECTOMY;  Surgeon: Everitt Amber, MD;  Location: [facility];  Service: Gynecology;  Laterality: N/A;  . TUBAL LIGATION      OB History    Gravida  5   Para  3   Term  3   Preterm      AB  2   Living  3     SAB      IAB  2   Ectopic      Multiple      Live Births               Home Medications    Prior to Admission medications   Medication Sig Start Date End Date Taking? Authorizing Provider  diclofenac Sodium (VOLTAREN) 1 % GEL Apply 2 g topically 4 (four) times daily. 01/26/21  Yes Rodriguez-Southworth, Sunday Spillers, PA-C  albuterol (VENTOLIN HFA) 108 (90 Base) MCG/ACT inhaler Inhale 2 puffs into the lungs every 4 (four) hours as needed. 06/27/20  [provider]  amLODipine (NORVASC) 2.5 MG tablet Take 1 tablet (2.5 mg total) by mouth daily. 08/06/16   Verlee Monte, MD  aspirin EC 325 MG EC tablet Take 1 tablet (325 mg total) by mouth daily. 08/06/16   Verlee Monte, MD  atorvastatin (LIPITOR) 20 MG tablet Take 1 tablet (20 mg total) by mouth daily at 6 PM. 08/06/16   Verlee Monte, MD  BLACK CURRANT SEED OIL PO Take 1,000 mg by mouth daily.    [provider]  fluticasone (CUTIVATE) 0.05 % cream Apply topically 2 (two) times daily. For one week then daily, gradually taper use 01/26/21   Estill Dooms, NP  HYDROCORTISONE EX Apply 1 application topically 2 (two) times daily as needed (for eczema.).    [provider]  metFORMIN (GLUCOPHAGE) 1000 MG tablet Take 500 mg by mouth 2 (two) times daily.     [provider]  montelukast (SINGULAIR) 10 MG tablet Take 10 mg by mouth daily. 03/01/20    [provider]  venlafaxine XR (EFFEXOR-XR) 75 MG 24 hr capsule Take 2 capsules (150 mg total) by mouth daily with breakfast. Patient taking differently: Take 75 mg by mouth daily with breakfast.  04/26/19   Emily Filbert, MD    Family History Family History  Problem Relation Age of Onset  . Hypertension Mother   . Cancer Mother 70       colon  . Heart disease Father 30       heart failure  . Diabetes Father   . Heart failure Father   . Diabetes Maternal Grandmother   . CVA Maternal Grandmother 66  . Diabetes Paternal Grandmother   . CVA Maternal Aunt 38       Aneurysm    Social History Social History   Tobacco Use  . Smoking status: Former Smoker    Packs/day: 0.25    Years: 15.00    Pack years: 3.75    Types: Cigarettes    Quit date: 03/11/2014    Years since quitting: 6.8  . Smokeless tobacco: Never Used  Vaping Use  . Vaping Use: Never used  Substance Use Topics  . Alcohol use: Yes    Comment: once a month  . Drug use: Yes    Types: Marijuana    Comment:  history of substance abuse; last marijuana use about 1 week ago     Allergies   Dapagliflozin and Lisinopril   Review of Systems Review of Systems  Cardiovascular: Positive for leg swelling.       R ankle  Musculoskeletal: Positive for gait problem.  Skin: Negative for color change, pallor, rash and wound.  Hematological: Does not bruise/bleed easily.     Physical Exam Triage Vital Signs ED Triage Vitals  Enc Vitals Group     BP 01/26/21 1133 (!) 157/88     Pulse Rate 01/26/21 1133 70     Resp 01/26/21 1133 18     Temp 01/26/21 1133 97.6 F (36.4 C)     Temp Source 01/26/21 1133 Oral     SpO2 01/26/21 1133 96 %     Weight --      Height --      Head Circumference --      Peak Flow --      Pain Score 01/26/21 1135 9     Pain Loc --      Pain Edu? --      Excl. in [place]? --  No data found.  Updated Vital Signs BP (!) 157/88 (BP Location: Right Arm)   Pulse 70   Temp 97.6 F  (36.4 C) (Oral)   Resp 18   SpO2 96%   Visual Acuity Right Eye Distance:   Left Eye Distance:   Bilateral Distance:    Right Eye Near:   Left Eye Near:    Bilateral Near:     Physical Exam Vitals and nursing note reviewed.  Constitutional:      Appearance: She is obese.  HENT:     Head: Normocephalic.     Right Ear: External ear normal.     Left Ear: External ear normal.  Eyes:     General: No scleral icterus.    Conjunctiva/sclera: Conjunctivae normal.  Pulmonary:     Effort: Pulmonary effort is normal.  Musculoskeletal:     Cervical back: Neck supple.     Comments: R ANKLE- with soft tissue tenderness anterior and posterior of the lateral malleolus, and medially on the bony area of the ankle below malleolus  Skin:    General: Skin is warm and dry.     Capillary Refill: Capillary refill takes less than 2 seconds.     Findings: No bruising, erythema or rash.  Neurological:     Mental Status: She is alert and oriented to person, place, and time.     Sensory: No sensory deficit.     Motor: Weakness present.     Gait: Gait abnormal.  Psychiatric:        Mood and Affect: Mood normal.        Behavior: Behavior normal.        Thought Content: Thought content normal.        Judgment: Judgment normal.    UC Treatments / Results  Labs (all labs ordered are listed, but only abnormal results are displayed) Labs Reviewed - No data to display  EKG   Radiology DG Ankle Complete Right  Result Date: 01/26/2021 CLINICAL DATA:  Right ankle pain for 4 days with no injury EXAM: RIGHT ANKLE - COMPLETE 3+ VIEW COMPARISON:  01/19/2017 FINDINGS: There is no evidence of fracture, dislocation, or joint effusion. 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 Heel spur. Dorsal talar spurring IMPRESSION: 1. No acute finding. 2. 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 3. Plantar heel spur. Electronically Signed   By: Monte Fantasia M.D.   On: 01/26/2021  11:57    Procedures Procedures (including critical care time)  Medications Ordered in UC Medications - No data to display  Initial Impression / Assessment and Plan / UC Course  I have reviewed the triage vital signs and the nursing notes. Pertinent  imaging results that were available during my care of the patient were reviewed by me and considered in my medical decision making (see chart for details). R ankle strain. She was placed on an ankle brace and advised to wear it for one week. I taught her ankle exercises to do gradually. She was given Voltagen gel for pain. Needs to FU with PCP.   Final Clinical Impressions(s) / UC Diagnoses   Final diagnoses:  Right ankle strain, initial encounter     Discharge Instructions     Follow up with your family Doctor or Orthopedist if pain persists     ED Prescriptions    Medication Sig Dispense Auth. Provider   diclofenac Sodium (VOLTAREN) 1 % GEL Apply 2 g topically 4 (four) times daily.  350 g Rodriguez-Southworth, Sunday Spillers, PA-C     PDMP not reviewed this encounter.   Shelby Mattocks, Hershal Coria 01/26/21 2330

## 2021-01-26 NOTE — ED Triage Notes (Signed)
Rt ankle pain since Friday.  No known injury. Ankle swollen.

## 2021-01-26 NOTE — Discharge Instructions (Addendum)
Follow up with your family Doctor or Orthopedist if pain persists

## 2021-03-02 ENCOUNTER — Other Ambulatory Visit: Payer: Self-pay

## 2021-03-02 DIAGNOSIS — Z1231 Encounter for screening mammogram for malignant neoplasm of breast: Secondary | ICD-10-CM

## 2021-04-28 ENCOUNTER — Ambulatory Visit: Payer: 59

## 2021-06-12 ENCOUNTER — Ambulatory Visit
Admission: RE | Admit: 2021-06-12 | Discharge: 2021-06-12 | Disposition: A | Discharge status: Home / Self Care | Payer: 59 | Source: Ambulatory Visit | Attending: Family Medicine | Admitting: Family Medicine

## 2021-06-12 ENCOUNTER — Other Ambulatory Visit: Payer: Self-pay

## 2021-06-12 DIAGNOSIS — Z1231 Encounter for screening mammogram for malignant neoplasm of breast: Secondary | ICD-10-CM

## 2021-06-19 ENCOUNTER — Other Ambulatory Visit: Payer: Self-pay

## 2021-06-19 ENCOUNTER — Emergency Department (HOSPITAL_COMMUNITY): Admission: EM | Admit: 2021-06-19 | Discharge: 2021-06-19 | Discharge status: Against Medical Advice | Payer: 59

## 2021-06-20 ENCOUNTER — Emergency Department (HOSPITAL_COMMUNITY)
Admission: EM | Admit: 2021-06-20 | Discharge: 2021-06-20 | Disposition: A | Discharge status: Home / Self Care | Payer: 59 | Attending: Emergency Medicine | Admitting: Emergency Medicine

## 2021-06-20 ENCOUNTER — Other Ambulatory Visit: Payer: Self-pay

## 2021-06-20 ENCOUNTER — Encounter (HOSPITAL_COMMUNITY): Payer: Self-pay

## 2021-06-20 DIAGNOSIS — J45909 Unspecified asthma, uncomplicated: Secondary | ICD-10-CM | POA: Insufficient documentation

## 2021-06-20 DIAGNOSIS — Z79899 Other long term (current) drug therapy: Secondary | ICD-10-CM | POA: Diagnosis not present

## 2021-06-20 DIAGNOSIS — R110 Nausea: Secondary | ICD-10-CM | POA: Insufficient documentation

## 2021-06-20 DIAGNOSIS — R1084 Generalized abdominal pain: Secondary | ICD-10-CM

## 2021-06-20 DIAGNOSIS — I10 Essential (primary) hypertension: Secondary | ICD-10-CM | POA: Insufficient documentation

## 2021-06-20 DIAGNOSIS — R5383 Other fatigue: Secondary | ICD-10-CM | POA: Diagnosis not present

## 2021-06-20 DIAGNOSIS — Z87891 Personal history of nicotine dependence: Secondary | ICD-10-CM | POA: Insufficient documentation

## 2021-06-20 DIAGNOSIS — E119 Type 2 diabetes mellitus without complications: Secondary | ICD-10-CM | POA: Diagnosis not present

## 2021-06-20 DIAGNOSIS — Z7982 Long term (current) use of aspirin: Secondary | ICD-10-CM | POA: Insufficient documentation

## 2021-06-20 DIAGNOSIS — K921 Melena: Secondary | ICD-10-CM | POA: Insufficient documentation

## 2021-06-20 DIAGNOSIS — Z7984 Long term (current) use of oral hypoglycemic drugs: Secondary | ICD-10-CM | POA: Insufficient documentation

## 2021-06-20 DIAGNOSIS — K219 Gastro-esophageal reflux disease without esophagitis: Secondary | ICD-10-CM | POA: Insufficient documentation

## 2021-06-20 DIAGNOSIS — R109 Unspecified abdominal pain: Secondary | ICD-10-CM | POA: Diagnosis present

## 2021-06-20 LAB — CBC
HCT: 40.8 % (ref 36.0–46.0)
Hemoglobin: 12.9 g/dL (ref 12.0–15.0)
MCH: 28.3 pg (ref 26.0–34.0)
MCHC: 31.6 g/dL (ref 30.0–36.0)
MCV: 89.5 fL (ref 80.0–100.0)
Platelets: 314 10*3/uL (ref 150–400)
RBC: 4.56 MIL/uL (ref 3.87–5.11)
RDW: 14.3 % (ref 11.5–15.5)
WBC: 6.3 10*3/uL (ref 4.0–10.5)
nRBC: 0 % (ref 0.0–0.2)

## 2021-06-20 LAB — COMPREHENSIVE METABOLIC PANEL
ALT: 19 U/L (ref 0–44)
AST: 15 U/L (ref 15–41)
Albumin: 3.8 g/dL (ref 3.5–5.0)
Alkaline Phosphatase: 87 U/L (ref 38–126)
Anion gap: 7 (ref 5–15)
BUN: 13 mg/dL (ref 6–20)
CO2: 24 mmol/L (ref 22–32)
Calcium: 8.6 mg/dL — ABNORMAL LOW (ref 8.9–10.3)
Chloride: 103 mmol/L (ref 98–111)
Creatinine, Ser: 0.86 mg/dL (ref 0.44–1.00)
GFR, Estimated: >60 mL/min (ref >60)
Glucose, Bld: 331 mg/dL — ABNORMAL HIGH (ref 70–99)
Potassium: 4 mmol/L (ref 3.5–5.1)
Sodium: 134 mmol/L — ABNORMAL LOW (ref 135–145)
Total Bilirubin: 0.5 mg/dL (ref 0.3–1.2)
Total Protein: 6.7 g/dL (ref 6.5–8.1)

## 2021-06-20 LAB — TYPE AND SCREEN
ABO/RH(D): A POS
Antibody Screen: NEGATIVE

## 2021-06-20 NOTE — ED Triage Notes (Addendum)
Pt c/o lower abd pain and lower back pain x 2 days.  Reports has been having bright red bleeding after having a bm for the past week.  Denies hemorrhoids.  Denies n/v/d.  LBM was this morning.  Denies any urinary symptoms, vaginal bleeding, or discharge. C/O feeling lightheaded and fatigue for the past 2 days.  Says the rectal bleeding has been very light.  Pt says is concerned because her mother passed away with colon cancer.

## 2021-06-20 NOTE — ED Provider Notes (Signed)
[facility] EMERGENCY DEPARTMENT Provider Note   CSN: [id number] Arrival date & time: 06/20/21  [id number]     History Chief Complaint  Patient presents with   Abdominal Pain    Joanna Newman is a 50 y.o. female  with past medical history significant for cholecystectomy, hysterectomy and BSO who presents with 1 week of bright red blood per rectum after bowel movements.  Patient reports that she has never been diagnosed with hemorrhoids in the past, however she has had some light bleeding, itching in her rectum in the past. Patient denies dysuria, urinary frequency, vomiting, constipation, or diarrhea.  Patient does endorse some nausea, as well as feelings of lightheadedness, fatigue for the last 2 days.  Patient reports rectal bleeding is minimal, is done after a few wipes.  Per patient no rectal bleeding this morning with bowel movement.  Patient does admit to family history of colon cancer in her mother.  Patient does endorse some dietary changes over the last few months due to body restrictions, reports that she has been eating more healthily, more red meat.    Abdominal Pain Associated symptoms: fatigue   Associated symptoms: no constipation, no diarrhea, no nausea, no shortness of breath and no vomiting       Past Medical History:  Diagnosis Date   Anxiety    Arthritis    Asthma    Bilateral ovarian cysts    Depression    Diabetes mellitus without complication (HCC)    Epidermoid cyst of finger    tip of left little finger   Fibroids    GERD (gastroesophageal reflux disease)    Hyperlipidemia    Hypertension    Insomnia    LVH (left ventricular hypertrophy)    Moderate   Obesity    Pain of right heel    Sleep apnea    History of no problems since weight loss   Stroke ([place]) 07/2016   No residual   Substance abuse ([place])    cocaine, marijuana and alcohol abuse. quit end of 2012    Patient Active Problem List   Diagnosis Date Noted   Pelvic mass in female  02/14/2018   Ovarian torsion 12/31/2017   Ovarian cyst 12/31/2017   Impingement of right ankle joint 04/05/2017   Acute CVA (cerebrovascular accident) ([place]) 08/06/2016   Paresthesia 08/04/2016   Hyponatremia 08/04/2016   Essential hypertension 12/25/2014   Insomnia 12/25/2014   Asthma, chronic 12/25/2014   Morbid obesity ([place]) 12/25/2014   Bilateral ovarian cysts 12/16/2014   Diabetes ([place]) 12/16/2014   Morbid obesity with BMI of 50.0-59.9, adult ([place]) 05/20/2012   Vaginal itching 05/20/2012    Past Surgical History:  Procedure Laterality Date   CHOLECYSTECTOMY     HEEL SPUR SURGERY Bilateral    KNEE SURGERY     LAPAROTOMY WITH STAGING N/A 02/14/2018   Procedure: MINI-LAPAROTOMY;  Surgeon: Everitt Amber, MD;  Location: [facility];  Service: Gynecology;  Laterality: N/A;   LYSIS OF ADHESION  02/14/2018   Procedure: LYSIS OF ADHESION;  Surgeon: Everitt Amber, MD;  Location: [facility];  Service: Gynecology;;   ROBOTIC ASSISTED TOTAL HYSTERECTOMY WITH BILATERAL SALPINGO OOPHERECTOMY N/A 02/14/2018   Procedure: XI ROBOTIC ASSISTED RIGHT SALPINGO OOPHORECTOMY, OMENTECTOMY;  Surgeon: Everitt Amber, MD;  Location: [facility];  Service: Gynecology;  Laterality: N/A;   TUBAL LIGATION       OB History     Gravida  5   Para  3   Term  3  Preterm      AB  2   Living  3      SAB      IAB  2   Ectopic      Multiple      Live Births              Family History  Problem Relation Age of Onset   Hypertension Mother    Cancer Mother 37       colon   Heart disease Father 70       heart failure   Diabetes Father    Heart failure Father    Diabetes Maternal Grandmother    CVA Maternal Grandmother 73   Diabetes Paternal Grandmother    CVA Maternal Aunt 51       Aneurysm    Social History   Tobacco Use   Smoking status: Former    Packs/day: 0.25    Years: 15.00    Pack years: 3.75    Types: Cigarettes    Quit date: 03/11/2014    Years since quitting: 7.2   Smokeless tobacco:  Never  Vaping Use   Vaping Use: Never used  Substance Use Topics   Alcohol use: Yes    Comment: once a month   Drug use: Yes    Types: Marijuana    Comment:  history of substance abuse; last marijuana use about 1 week ago    Home Medications Prior to Admission medications   Medication Sig Start Date End Date Taking? Authorizing Provider  albuterol (VENTOLIN HFA) 108 (90 Base) MCG/ACT inhaler Inhale 2 puffs into the lungs every 4 (four) hours as needed. 06/27/20   [provider]  amLODipine (NORVASC) 2.5 MG tablet Take 1 tablet (2.5 mg total) by mouth daily. 08/06/16   Verlee Monte, MD  aspirin EC 325 MG EC tablet Take 1 tablet (325 mg total) by mouth daily. 08/06/16   Verlee Monte, MD  atorvastatin (LIPITOR) 20 MG tablet Take 1 tablet (20 mg total) by mouth daily at 6 PM. 08/06/16   Verlee Monte, MD  BLACK CURRANT SEED OIL PO Take 1,000 mg by mouth daily.    [provider]  diclofenac Sodium (VOLTAREN) 1 % GEL Apply 2 g topically 4 (four) times daily. 01/26/21   Rodriguez-Southworth, Sunday Spillers, PA-C  fluticasone (CUTIVATE) 0.05 % cream Apply topically 2 (two) times daily. For one week then daily, gradually taper use 01/26/21   Estill Dooms, NP  HYDROCORTISONE EX Apply 1 application topically 2 (two) times daily as needed (for eczema.).    [provider]  metFORMIN (GLUCOPHAGE) 1000 MG tablet Take 500 mg by mouth 2 (two) times daily.     [provider]  montelukast (SINGULAIR) 10 MG tablet Take 10 mg by mouth daily. 03/01/20   [provider]  venlafaxine XR (EFFEXOR-XR) 75 MG 24 hr capsule Take 2 capsules (150 mg total) by mouth daily with breakfast. Patient taking differently: Take 75 mg by mouth daily with breakfast.  04/26/19   Emily Filbert, MD    Allergies    Dapagliflozin and Lisinopril  Review of Systems   Review of Systems  Constitutional:  Positive for fatigue.  Respiratory:  Negative for chest tightness and shortness of  breath.   Gastrointestinal:  Positive for abdominal pain and anal bleeding. Negative for constipation, diarrhea, nausea and vomiting.  Neurological:  Positive for light-headedness.  All other systems reviewed and are negative.  Physical Exam Updated Vital Signs BP [name])  145/97   Pulse 65   Temp 98 F (36.7 C) (Oral)   Resp 18   Ht '5\' 4"'$  (1.626 m)   Wt (!) 142.9 kg   LMP 11/18/2020 (Approximate)   SpO2 98%   BMI 54.07 kg/m   Physical Exam Vitals and nursing note reviewed.  Constitutional:      General: She is not in acute distress.    Appearance: Normal appearance.  HENT:     Head: Normocephalic and atraumatic.  Eyes:     General:        Right eye: No discharge.        Left eye: No discharge.  Cardiovascular:     Rate and Rhythm: Normal rate and regular rhythm.     Heart sounds: No murmur heard.   No friction rub. No gallop.  Pulmonary:     Effort: Pulmonary effort is normal.     Breath sounds: Normal breath sounds.  Abdominal:     General: Bowel sounds are normal.     Palpations: Abdomen is soft.     Comments: Some tenderness to palpation in the right upper quadrant, and/or epigastric region.  No clear Murphy sign.  No tenderness to palpation in lower quadrants, no flank or CVA tenderness.  No suprapubic tenderness.  No rigidity, rebound, guarding.   Genitourinary:    Comments: External rectum without any evidence of hemorrhoids, or other abnormality.  Digital rectal exam does not reveal any obvious mass, signs of internal hemorrhoids.  Minimal stool in rectal vault, Hemoccult negative. Skin:    General: Skin is warm and dry.     Capillary Refill: Capillary refill takes less than 2 seconds.     Comments: There is a small round mass in the subcutaneous fat underneath the patient's pannus on the right side.  Favor simple epidermoid cyst.  No redness, obvious pore, or purulent drainage. Some TTP.   Neurological:     Mental Status: She is alert and oriented to person, place,  and time.  Psychiatric:        Mood and Affect: Mood normal.        Behavior: Behavior normal.    ED Results / Procedures / Treatments   Labs (all labs ordered are listed, but only abnormal results are displayed) Labs Reviewed  COMPREHENSIVE METABOLIC PANEL - Abnormal; Notable for the following components:      Result Value   Sodium 134 (*)    Glucose, Bld 331 (*)    Calcium 8.6 (*)    All other components within normal limits  CBC  POC OCCULT BLOOD, ED  TYPE AND SCREEN    EKG None  Radiology No results found.  Procedures Procedures   Medications Ordered in ED Medications - No data to display  ED Course  I have reviewed the triage vital signs and the nursing notes.  Pertinent labs & imaging results that were available during my care of the patient were reviewed by me and considered in my medical decision making (see chart for details).    MDM Rules/Calculators/A&P                         I discussed this case with my attending physician who cosigned this note including patient's presenting symptoms, physical exam, and planned diagnostics and interventions. Attending physician stated agreement with plan or made changes to plan which were implemented.   Attending physician assessed patient at bedside.  Considered a differential for rectal  bleeding and abdominal pain including diverticulosis, diverticulitis, colon cancer, hemorrhoids, anal fissure. Considering fatigue, will check CBC to evaluate for anemia. Will perform rectal exam and hemoccult test.   Discussed the important of follow up for colonoscopy / further evaluation today is unremarkable.  Patient is overall well-appearing, has no B signs including recent unintentional weight loss, night sweats, fever.   Stool exam unremarkable, no obvious masses.  Mucousy discharge without clear stool in rectal vault.  Lab work is only significant for elevated blood glucose.  Patient is diabetic, is currently taking  metformin.  She reports that she has not missed any doses.  Discussed that she should follow-up with her primary care to discuss potentially tighter control of her blood sugar.  We will reach out to Dr. Abbey Chatters with GI in [place] to set up an appointment for colonoscopy for this patient.  Discussed return precautions including worsening abdominal pain, frank blood per rectum, obstipation, fever, unintentional weight loss, night sweats. Final Clinical Impression(s) / ED Diagnoses Final diagnoses:  Generalized abdominal pain  Blood in stool    Rx / DC Orders ED Discharge Orders     None        Dorien Chihuahua 06/20/21 1103    Noemi Chapel, MD 06/21/21 [phone]

## 2021-06-20 NOTE — ED Provider Notes (Signed)
Medical screening examination/treatment/procedure(s) were conducted as a shared visit with non-physician practitioner(s) and myself.  I personally evaluated the patient during the encounter.  Clinical Impression:   Final diagnoses:  None    Pt with some lower abd pain and rectal bleeding - and hx of Family - Colon CA -has no tenderness on my exam, vitals are normal, hemoglobin is normal, patient is okay to follow-up outpatient for colonoscopy, referral made to Dr. Abbey Chatters.  Patient agreeable to the plan, she does not need advanced imaging at this time and does not need admission for stabilizing care she appears well and very stable  Vitals:   06/20/21 0857 06/20/21 0903 06/20/21 0904 06/20/21 0924  BP:  (!) 161/109 (!) 143/119 129/89  Pulse:  79 84 68  Resp:  18  16  Temp:  97.6 F (36.4 C)  98 F (36.7 C)  TempSrc:  Oral  Oral  SpO2:  99%  98%  Weight: (!) 142.9 kg     Height: 1.626 m ('5\' 4"'$ )         Noemi Chapel, MD 06/20/21 1032

## 2021-06-20 NOTE — Discharge Instructions (Signed)
As we discussed your only lab work abnormality today was that you had some elevated blood sugar.  Recommend following up with your primary care to discuss tighter control of your diabetes, this could be contributing to some of your fatigue.  Additionally as we discussed you have had some dietary changes recently, tighter control of your diet, returning to your normal diet may help with some abdominal pain, stool changes, and elevated blood sugar.  Recommend using stool softener as needed for difficult bowel movements, such as MiraLAX.  I sent an inbox message to Dr. Abbey Chatters in [place] for his office to contact you to set up a colonoscopy.  I have also attached is contact information in your discharge paperwork, if you have not heard from his office by early next week I recommend that you contact them and schedule an appointment.  Currently he began to have worsening abdominal pain, more rectal bleeding, especially that is not stopping.

## 2021-06-22 ENCOUNTER — Telehealth: Payer: Self-pay | Admitting: Internal Medicine

## 2021-06-22 NOTE — Telephone Encounter (Signed)
I was contacted by the ER regarding this patient.  She presented to [facility], ER over the weekend with rectal bleeding.  Can we get her into the clinic soon as possible with either myself or one of the apps she will need colonoscopy scheduled.  If no availability, please add to cancellation list.  Thank you

## 2021-06-30 ENCOUNTER — Other Ambulatory Visit: Payer: Self-pay

## 2021-06-30 ENCOUNTER — Encounter (HOSPITAL_COMMUNITY): Payer: Self-pay

## 2021-06-30 ENCOUNTER — Ambulatory Visit (HOSPITAL_COMMUNITY): Payer: 59 | Admitting: Physical Therapy

## 2021-08-05 ENCOUNTER — Encounter: Payer: Self-pay | Admitting: Internal Medicine

## 2021-08-05 ENCOUNTER — Ambulatory Visit (INDEPENDENT_AMBULATORY_CARE_PROVIDER_SITE_OTHER): Payer: 59 | Admitting: Internal Medicine

## 2021-08-05 ENCOUNTER — Other Ambulatory Visit: Payer: Self-pay

## 2021-08-05 VITALS — BP 158/106 | HR 80 | Temp 97.1°F | Ht 64.0 in | Wt 309.0 lb

## 2021-08-05 DIAGNOSIS — Z6841 Body Mass Index (BMI) 40.0 and over, adult: Secondary | ICD-10-CM

## 2021-08-05 DIAGNOSIS — K625 Hemorrhage of anus and rectum: Secondary | ICD-10-CM

## 2021-08-05 MED ORDER — PEG 3350-KCL-NA BICARB-NACL 420 G PO SOLR: 4000.0000 mL | ORAL | 0 refills | Status: DC

## 2021-08-05 NOTE — H&P (View-Only) (Signed)
Primary Care Physician:  Alroy Dust, L.Marlou Sa, MD Primary Gastroenterologist:  Dr. Abbey Chatters  Chief Complaint  Patient presents with   Rectal Bleeding    Never had tcs. Mother had colon cancer    HPI:   Joanna Newman is a 50 y.o. female who presents to the clinic today as a new patient.   Presented to [facility], ER 06/20/2021 for evaluation for rectal bleeding.  Work-up was negative.  Hemoccult negative.  Hemoglobin stable at 12.9.  She was discharged with recommendations to see GI.  Today, she states she has had intermittent rectal bleeding primarily with bowel movements for some time now though this has recently gotten worse in regards to frequency.  Notes bright red blood.  No rectal pain or discomfort.  No abdominal pain.  No melena.  States her mother died from colon cancer and she is concerned about this.  No previous colonoscopy.  Denies any upper GI symptoms including heartburn, reflux, dysphagia, odynophagia, epigastric/chest pain.  Past Medical History:  Diagnosis Date   Anxiety    Arthritis    Asthma    Bilateral ovarian cysts    Depression    Diabetes mellitus without complication ([place])    Epidermoid cyst of finger    tip of left little finger   Fibroids    GERD (gastroesophageal reflux disease)    Hyperlipidemia    Hypertension    Insomnia    LVH (left ventricular hypertrophy)    Moderate   Obesity    Pain of right heel    Sleep apnea    History of no problems since weight loss   Stroke ([place]) 07/2016   No residual   Substance abuse ([place])    cocaine, marijuana and alcohol abuse. quit end of 2012    Past Surgical History:  Procedure Laterality Date   CHOLECYSTECTOMY     HEEL SPUR SURGERY Bilateral    KNEE SURGERY     LAPAROTOMY WITH STAGING N/A 02/14/2018   Procedure: MINI-LAPAROTOMY;  Surgeon: Everitt Amber, MD;  Location: [facility];  Service: Gynecology;  Laterality: N/A;   LYSIS OF ADHESION  02/14/2018   Procedure: LYSIS OF ADHESION;  Surgeon: Everitt Amber, MD;  Location: [facility];  Service: Gynecology;;   ROBOTIC ASSISTED TOTAL HYSTERECTOMY WITH BILATERAL SALPINGO OOPHERECTOMY N/A 02/14/2018   Procedure: XI ROBOTIC ASSISTED RIGHT SALPINGO OOPHORECTOMY, OMENTECTOMY;  Surgeon: Everitt Amber, MD;  Location: [facility];  Service: Gynecology;  Laterality: N/A;   TUBAL LIGATION      Current Outpatient Medications  Medication Sig Dispense Refill   albuterol (VENTOLIN HFA) 108 (90 Base) MCG/ACT inhaler Inhale 2 puffs into the lungs every 4 (four) hours as needed.     amLODipine (NORVASC) 2.5 MG tablet Take 1 tablet (2.5 mg total) by mouth daily. 30 tablet 0   aspirin EC 325 MG EC tablet Take 1 tablet (325 mg total) by mouth daily.     atorvastatin (LIPITOR) 20 MG tablet Take 1 tablet (20 mg total) by mouth daily at 6 PM. 30 tablet 0   diclofenac Sodium (VOLTAREN) 1 % GEL Apply 2 g topically 4 (four) times daily. (Patient taking differently: Apply 2 g topically as needed.) 350 g 0   fluticasone (CUTIVATE) 0.05 % cream Apply topically 2 (two) times daily. For one week then daily, gradually taper use 30 g 0   HYDROCORTISONE EX Apply 1 application topically 2 (two) times daily as needed (for eczema.).     metFORMIN (GLUCOPHAGE) 1000 MG tablet  Take 500 mg by mouth 2 (two) times daily.      montelukast (SINGULAIR) 10 MG tablet Take 10 mg by mouth daily.     venlafaxine XR (EFFEXOR-XR) 75 MG 24 hr capsule Take 2 capsules (150 mg total) by mouth daily with breakfast. (Patient taking differently: Take 75 mg by mouth daily with breakfast.) 60 capsule 12   BLACK CURRANT SEED OIL PO Take 1,000 mg by mouth daily. (Patient not taking: Reported on 08/05/2021)     Current Facility-Administered Medications  Medication Dose Route Frequency Provider Last Rate Last Admin   clobetasol cream (TEMOVATE) 0.05 %   Topical BID Jonnie Kind, MD        Allergies as of 08/05/2021 - Review Complete 08/05/2021  Allergen Reaction Noted   Dapagliflozin Other (See Comments)  03/15/2018   Lisinopril Swelling 06/23/2014    Family History  Problem Relation Age of Onset   Hypertension Mother    Cancer Mother 28       colon   Heart disease Father 42       heart failure   Diabetes Father    Heart failure Father    Diabetes Maternal Grandmother    CVA Maternal Grandmother 52   Diabetes Paternal Grandmother    CVA Maternal Aunt 85       Aneurysm    Social History   Socioeconomic History   Marital status: Married    Spouse name: Not on file   Number of children: Not on file   Years of education: Not on file   Highest education level: Not on file  Occupational History   Occupation: event specialist  Tobacco Use   Smoking status: Former    Packs/day: 0.25    Years: 15.00    Pack years: 3.75    Types: Cigarettes    Quit date: 03/11/2014    Years since quitting: 7.4   Smokeless tobacco: Never  Vaping Use   Vaping Use: Never used  Substance and Sexual Activity   Alcohol use: Yes    Comment: occ; more in past   Drug use: Yes    Types: Marijuana    Comment:  history of substance abuse; last marijuana use about 1 week ago   Sexual activity: Not Currently    Birth control/protection: None  Other Topics Concern   Not on file  Social History Narrative   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  HENT:  Negative for congestion and hearing loss.   Eyes:  Negative for blurred vision and double vision.  Respiratory:  Negative for cough and shortness of breath.   Cardiovascular:  Negative for chest pain and palpitations.  Gastrointestinal:  Negative for abdominal pain, blood in  stool, constipation, diarrhea, heartburn, melena and vomiting.       Rectal bleeding  Genitourinary:  Negative for dysuria and urgency.  Musculoskeletal:  Negative for joint pain and myalgias.  Skin:  Negative for itching and rash.  Neurological:  Negative for dizziness and headaches.  Psychiatric/Behavioral:  Negative for depression. The patient is not nervous/anxious.       Objective: BP [name])  158/106   Pulse 80   Temp (!) 97.1 F (36.2 C) (Temporal)   Ht 5\' 4"  (1.626 m)   Wt (!) 309 lb (140.2 kg)   BMI 53.04 kg/m  Physical Exam Constitutional:      Appearance: Normal appearance. She is obese.  HENT:     Head: Normocephalic and atraumatic.  Eyes:     Extraocular Movements: Extraocular movements intact.     Conjunctiva/sclera: Conjunctivae normal.  Cardiovascular:     Rate and Rhythm: Normal rate and regular rhythm.  Pulmonary:     Effort: Pulmonary effort is normal.     Breath sounds: Normal breath sounds.  Abdominal:     General: Bowel sounds are normal.     Palpations: Abdomen is soft.  Musculoskeletal:        General: No swelling. Normal range of motion.     Cervical back: Normal range of motion and neck supple.  Skin:    General: Skin is warm and dry.     Coloration: Skin is not jaundiced.  Neurological:     General: No focal deficit present.     Mental Status: She is alert and oriented to person, place, and time.  Psychiatric:        Mood and Affect: Mood normal.        Behavior: Behavior normal.   Rectal exam deferred by patient until time of colonoscopy.  Assessment: *Rectal bleeding *Family history of colon cancer in mother  Plan: Will schedule for diagnostic colonoscopy.The risks including infection, bleed, or perforation as well as benefits, limitations, alternatives and imponderables have been reviewed with the patient. Questions have been answered. All parties agreeable.  Counseled patient that she should have a colonoscopy for high rescreening  purposes at least every 5 years.  If internal hemorrhoids identified as primary cause of bleeding, can consider hemorrhoid banding in the clinic.  08/05/2021 3:20 PM   Disclaimer: This note was dictated with voice recognition software. Similar sounding words can inadvertently be transcribed and may not be corrected upon review.

## 2021-08-05 NOTE — Patient Instructions (Signed)
We will schedule you for colonoscopy to further evaluate your rectal bleeding as well as due to your family history of colon cancer.  I would recommend that you have a colonoscopy at least every 5 years.  If your bleeding is due to internal hemorrhoids, we can discuss hemorrhoid banding here in our clinic for treatment.  In the meantime you can increase fiber in your diet.  Important to stay well-hydrated.  It was very nice meeting you today.  Dr. Abbey Chatters  At [facility] we value your feedback. You may receive a survey about your visit today. Please share your experience as we strive to create trusting relationships with our patients to provide genuine, compassionate, quality care.  We appreciate your understanding and patience as we review any laboratory studies, imaging, and other diagnostic tests that are ordered as we care for you. Our office policy is 5 business days for review of these results, and any emergent or urgent results are addressed in a timely manner for your best interest. If you do not hear from our office in 1 week, please contact us.   We also encourage the use of MyChart, which contains your medical information for your review as well. If you are not enrolled in this feature, an access code is on this after visit summary for your convenience. Thank you for allowing [place] to be involved in your care.  It was great to see you today!  I hope you have a great rest of your Fall!    Elon Alas. Abbey Chatters, D.O. Gastroenterology and Hepatology [facility]

## 2021-08-05 NOTE — Progress Notes (Signed)
Primary Care Physician:  Alroy Dust, L.Marlou Sa, MD Primary Gastroenterologist:  Dr. Abbey Chatters  Chief Complaint  Patient presents with   Rectal Bleeding    Never had tcs. Mother had colon cancer    HPI:   Joanna Newman is a 50 y.o. female who presents to the clinic today as a new patient.   Presented to [facility], ER 06/20/2021 for evaluation for rectal bleeding.  Work-up was negative.  Hemoccult negative.  Hemoglobin stable at 12.9.  She was discharged with recommendations to see GI.  Today, she states she has had intermittent rectal bleeding primarily with bowel movements for some time now though this has recently gotten worse in regards to frequency.  Notes bright red blood.  No rectal pain or discomfort.  No abdominal pain.  No melena.  States her mother died from colon cancer and she is concerned about this.  No previous colonoscopy.  Denies any upper GI symptoms including heartburn, reflux, dysphagia, odynophagia, epigastric/chest pain.  Past Medical History:  Diagnosis Date   Anxiety    Arthritis    Asthma    Bilateral ovarian cysts    Depression    Diabetes mellitus without complication ([place])    Epidermoid cyst of finger    tip of left little finger   Fibroids    GERD (gastroesophageal reflux disease)    Hyperlipidemia    Hypertension    Insomnia    LVH (left ventricular hypertrophy)    Moderate   Obesity    Pain of right heel    Sleep apnea    History of no problems since weight loss   Stroke ([place]) 07/2016   No residual   Substance abuse ([place])    cocaine, marijuana and alcohol abuse. quit end of 2012    Past Surgical History:  Procedure Laterality Date   CHOLECYSTECTOMY     HEEL SPUR SURGERY Bilateral    KNEE SURGERY     LAPAROTOMY WITH STAGING N/A 02/14/2018   Procedure: MINI-LAPAROTOMY;  Surgeon: Everitt Amber, MD;  [facility]: [facility];  Service: Gynecology;  Laterality: N/A;   LYSIS OF ADHESION  02/14/2018   Procedure: LYSIS OF ADHESION;  Surgeon: Everitt Amber, MD;  [facility]: [facility];  Service: Gynecology;;   ROBOTIC ASSISTED TOTAL HYSTERECTOMY WITH BILATERAL SALPINGO OOPHERECTOMY N/A 02/14/2018   Procedure: XI ROBOTIC ASSISTED RIGHT SALPINGO OOPHORECTOMY, OMENTECTOMY;  Surgeon: Everitt Amber, MD;  Location: [facility];  Service: Gynecology;  Laterality: N/A;   TUBAL LIGATION      Current Outpatient Medications  Medication Sig Dispense Refill   albuterol (VENTOLIN HFA) 108 (90 Base) MCG/ACT inhaler Inhale 2 puffs into the lungs every 4 (four) hours as needed.     amLODipine (NORVASC) 2.5 MG tablet Take 1 tablet (2.5 mg total) by mouth daily. 30 tablet 0   aspirin EC 325 MG EC tablet Take 1 tablet (325 mg total) by mouth daily.     atorvastatin (LIPITOR) 20 MG tablet Take 1 tablet (20 mg total) by mouth daily at 6 PM. 30 tablet 0   diclofenac Sodium (VOLTAREN) 1 % GEL Apply 2 g topically 4 (four) times daily. (Patient taking differently: Apply 2 g topically as needed.) 350 g 0   fluticasone (CUTIVATE) 0.05 % cream Apply topically 2 (two) times daily. For one week then daily, gradually taper use 30 g 0   HYDROCORTISONE EX Apply 1 application topically 2 (two) times daily as needed (for eczema.).     metFORMIN (GLUCOPHAGE) 1000 MG tablet  Take 500 mg by mouth 2 (two) times daily.      montelukast (SINGULAIR) 10 MG tablet Take 10 mg by mouth daily.     venlafaxine XR (EFFEXOR-XR) 75 MG 24 hr capsule Take 2 capsules (150 mg total) by mouth daily with breakfast. (Patient taking differently: Take 75 mg by mouth daily with breakfast.) 60 capsule 12   BLACK CURRANT SEED OIL PO Take 1,000 mg by mouth daily. (Patient not taking: Reported on 08/05/2021)     Current Facility-Administered Medications  Medication Dose Route Frequency Provider Last Rate Last Admin   clobetasol cream (TEMOVATE) 0.05 %   Topical BID Jonnie Kind, MD        Allergies as of 08/05/2021 - Review Complete 08/05/2021  Allergen Reaction Noted   Dapagliflozin Other (See Comments)  03/15/2018   Lisinopril Swelling 06/23/2014    Family History  Problem Relation Age of Onset   Hypertension Mother    Cancer Mother 32       colon   Heart disease Father 45       heart failure   Diabetes Father    Heart failure Father    Diabetes Maternal Grandmother    CVA Maternal Grandmother 103   Diabetes Paternal Grandmother    CVA Maternal Aunt 66       Aneurysm    Social History   Socioeconomic History   Marital status: Married    Spouse name: Not on file   Number of children: Not on file   Years of education: Not on file   Highest education level: Not on file  Occupational History   Occupation: event specialist  Tobacco Use   Smoking status: Former    Packs/day: 0.25    Years: 15.00    Pack years: 3.75    Types: Cigarettes    Quit date: 03/11/2014    Years since quitting: 7.4   Smokeless tobacco: Never  Vaping Use   Vaping Use: Never used  Substance and Sexual Activity   Alcohol use: Yes    Comment: occ; more in past   Drug use: Yes    Types: Marijuana    Comment:  history of substance abuse; last marijuana use about 1 week ago   Sexual activity: Not Currently    Birth control/protection: None  Other Topics Concern   Not on file  Social History Narrative   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  HENT:  Negative for congestion and hearing loss.   Eyes:  Negative for blurred vision and double vision.  Respiratory:  Negative for cough and shortness of breath.   Cardiovascular:  Negative for chest pain and palpitations.  Gastrointestinal:  Negative for abdominal pain, blood in  stool, constipation, diarrhea, heartburn, melena and vomiting.       Rectal bleeding  Genitourinary:  Negative for dysuria and urgency.  Musculoskeletal:  Negative for joint pain and myalgias.  Skin:  Negative for itching and rash.  Neurological:  Negative for dizziness and headaches.  Psychiatric/Behavioral:  Negative for depression. The patient is not nervous/anxious.       Objective: BP [name])  158/106   Pulse 80   Temp (!) 97.1 F (36.2 C) (Temporal)   Ht 5\' 4"  (1.626 m)   Wt (!) 309 lb (140.2 kg)   BMI 53.04 kg/m  Physical Exam Constitutional:      Appearance: Normal appearance. She is obese.  HENT:     Head: Normocephalic and atraumatic.  Eyes:     Extraocular Movements: Extraocular movements intact.     Conjunctiva/sclera: Conjunctivae normal.  Cardiovascular:     Rate and Rhythm: Normal rate and regular rhythm.  Pulmonary:     Effort: Pulmonary effort is normal.     Breath sounds: Normal breath sounds.  Abdominal:     General: Bowel sounds are normal.     Palpations: Abdomen is soft.  Musculoskeletal:        General: No swelling. Normal range of motion.     Cervical back: Normal range of motion and neck supple.  Skin:    General: Skin is warm and dry.     Coloration: Skin is not jaundiced.  Neurological:     General: No focal deficit present.     Mental Status: She is alert and oriented to person, place, and time.  Psychiatric:        Mood and Affect: Mood normal.        Behavior: Behavior normal.   Rectal exam deferred by patient until time of colonoscopy.  Assessment: *Rectal bleeding *Family history of colon cancer in mother  Plan: Will schedule for diagnostic colonoscopy.The risks including infection, bleed, or perforation as well as benefits, limitations, alternatives and imponderables have been reviewed with the patient. Questions have been answered. All parties agreeable.  Counseled patient that she should have a colonoscopy for high rescreening  purposes at least every 5 years.  If internal hemorrhoids identified as primary cause of bleeding, can consider hemorrhoid banding in the clinic.  08/05/2021 3:20 PM   Disclaimer: This note was dictated with voice recognition software. Similar sounding words can inadvertently be transcribed and may not be corrected upon review.

## 2021-08-06 ENCOUNTER — Telehealth: Payer: Self-pay | Admitting: Internal Medicine

## 2021-08-06 NOTE — Telephone Encounter (Signed)
Pre-op appt 08/26/21. Appt letter mailed new procedure instructions.

## 2021-08-06 NOTE — Telephone Encounter (Signed)
Pt wants to reschedule her colonoscopy with Dr Abbey Chatters on 11/28 to another day. [phone]

## 2021-08-06 NOTE — Telephone Encounter (Signed)
Called pt, TCS rescheduled ot 08/31/21 at 10:00am. Endo scheduler informed.

## 2021-08-26 ENCOUNTER — Encounter (HOSPITAL_COMMUNITY)
Admission: RE | Admit: 2021-08-26 | Discharge: 2021-08-26 | Disposition: A | Discharge status: Home / Self Care | Payer: 59 | Source: Ambulatory Visit | Attending: Internal Medicine | Admitting: Internal Medicine

## 2021-08-26 ENCOUNTER — Telehealth: Payer: Self-pay | Admitting: *Deleted

## 2021-08-26 ENCOUNTER — Other Ambulatory Visit: Payer: Self-pay

## 2021-08-26 VITALS — BP 136/94 | HR 75 | Temp 97.4°F | Resp 18 | Ht 64.0 in | Wt 320.0 lb

## 2021-08-26 DIAGNOSIS — Z794 Long term (current) use of insulin: Secondary | ICD-10-CM

## 2021-08-26 DIAGNOSIS — E119 Type 2 diabetes mellitus without complications: Secondary | ICD-10-CM

## 2021-08-26 DIAGNOSIS — Z01818 Encounter for other preprocedural examination: Secondary | ICD-10-CM | POA: Diagnosis not present

## 2021-08-26 LAB — BASIC METABOLIC PANEL
Anion gap: 7 (ref 5–15)
BUN: 11 mg/dL (ref 6–20)
CO2: 25 mmol/L (ref 22–32)
Calcium: 8.6 mg/dL — ABNORMAL LOW (ref 8.9–10.3)
Chloride: 103 mmol/L (ref 98–111)
Creatinine, Ser: 0.87 mg/dL (ref 0.44–1.00)
GFR, Estimated: >60 mL/min (ref >60)
Glucose, Bld: 335 mg/dL — ABNORMAL HIGH (ref 70–99)
Potassium: 4.1 mmol/L (ref 3.5–5.1)
Sodium: 135 mmol/L (ref 135–145)

## 2021-08-26 LAB — HCG, SERUM, QUALITATIVE: Preg, Serum: NEGATIVE

## 2021-08-26 LAB — RAPID URINE DRUG SCREEN, HOSP PERFORMED
Amphetamines: NOT DETECTED
Barbiturates: NOT DETECTED
Benzodiazepines: NOT DETECTED
Cocaine: NOT DETECTED
Opiates: NOT DETECTED
Tetrahydrocannabinol: POSITIVE — AB

## 2021-08-26 NOTE — Patient Instructions (Addendum)
    Joanna Newman  08/26/2021     @PREFPERIOPPHARMACY @   Your procedure is scheduled on Monday, 08/31/21.  Report to [facility] at 0800 A.M.  Call this number if you have problems the morning of surgery:  [phone]   Remember:  Please follow the diet and prep instructions given to you by the doctors office.      Take these medicines the morning of surgery with A SIP OF WATER Amlodipine, effexor, Xanax and use your albuterol inhaler      Decrease your aspirin dose to 81 mg until after the procedure.    Do not wear jewelry, make-up or nail polish.  Do not wear lotions, powders, or perfumes, or deodorant.  Do not shave 48 hours prior to surgery.  Men may shave face and neck.  Do not bring valuables to the hospital.  [facility] is not responsible for any belongings or valuables.  Contacts, dentures or bridgework may not be worn into surgery.  Leave your suitcase in the car.  After surgery it may be brought to your room.  For patients admitted to the hospital, discharge time will be determined by your treatment team.  Patients discharged the day of surgery will not be allowed to drive home.   Name and phone number of your driver:   family Special instructions:  Please follow special instructions given to you by the office.  Please read over the following fact sheets that you were given. Anesthesia Post-op Instructions    PATIENT INSTRUCTIONS POST-ANESTHESIA  IMMEDIATELY FOLLOWING SURGERY:  Do not drive or operate machinery for the first twenty four hours after surgery.  Do not make any important decisions for twenty four hours after surgery or while taking narcotic pain medications or sedatives.  If you develop intractable nausea and vomiting or a severe headache please notify your doctor immediately.  FOLLOW-UP:  Please make an appointment with your surgeon as instructed. You do not need to follow up with anesthesia unless specifically instructed to do  so.  WOUND CARE INSTRUCTIONS (if applicable):  Keep a dry clean dressing on the anesthesia/puncture wound site if there is drainage.  Once the wound has quit draining you may leave it open to air.  Generally you should leave the bandage intact for twenty four hours unless there is drainage.  If the epidural site drains for more than 36-48 hours please call the anesthesia department.  QUESTIONS?:  Please feel free to call your physician or the hospital operator if you have any questions, and they will be happy to assist you.

## 2021-08-26 NOTE — Telephone Encounter (Signed)
Olivia Mackie from Day Surgery called over.  Pt is taking 325 mg Aspirin.  Wants to know if pt needs to stop.  Procedure scheduled for 08/31/2021.  Spoke to Dr. Abbey Chatters.  He advised to decrease to Aspirin 81 mg 5 days leading up to colonoscopy.  Information relayed to [place].  She is going to inform pt.

## 2021-08-26 NOTE — Progress Notes (Signed)
Patient is to decrease Aspirin to 81 mg daily until after Colonoscopy on 08/31/2021 per Dr Abbey Chatters.

## 2021-08-31 ENCOUNTER — Ambulatory Visit (HOSPITAL_COMMUNITY): Payer: 59 | Admitting: Anesthesiology

## 2021-08-31 ENCOUNTER — Encounter (HOSPITAL_COMMUNITY): Payer: Self-pay

## 2021-08-31 ENCOUNTER — Encounter (HOSPITAL_COMMUNITY)
Admission: RE | Disposition: A | Discharge status: Home / Self Care | Payer: Self-pay | Source: Ambulatory Visit | Attending: Internal Medicine

## 2021-08-31 ENCOUNTER — Other Ambulatory Visit: Payer: Self-pay

## 2021-08-31 ENCOUNTER — Ambulatory Visit (HOSPITAL_COMMUNITY)
Admission: RE | Admit: 2021-08-31 | Discharge: 2021-08-31 | Disposition: A | Discharge status: Home / Self Care | Payer: 59 | Source: Ambulatory Visit | Attending: Internal Medicine | Admitting: Internal Medicine

## 2021-08-31 DIAGNOSIS — Z800 Family history of malignant neoplasm of digestive organs: Secondary | ICD-10-CM | POA: Diagnosis not present

## 2021-08-31 DIAGNOSIS — Z87891 Personal history of nicotine dependence: Secondary | ICD-10-CM | POA: Insufficient documentation

## 2021-08-31 DIAGNOSIS — K625 Hemorrhage of anus and rectum: Secondary | ICD-10-CM | POA: Diagnosis not present

## 2021-08-31 DIAGNOSIS — K621 Rectal polyp: Secondary | ICD-10-CM | POA: Diagnosis not present

## 2021-08-31 DIAGNOSIS — D128 Benign neoplasm of rectum: Secondary | ICD-10-CM | POA: Diagnosis not present

## 2021-08-31 DIAGNOSIS — K648 Other hemorrhoids: Secondary | ICD-10-CM | POA: Diagnosis not present

## 2021-08-31 HISTORY — PX: COLONOSCOPY WITH PROPOFOL: SHX5780

## 2021-08-31 HISTORY — PX: POLYPECTOMY: SHX5525

## 2021-08-31 LAB — GLUCOSE, CAPILLARY: Glucose-Capillary: 182 mg/dL — ABNORMAL HIGH (ref 70–99)

## 2021-08-31 SURGERY — COLONOSCOPY WITH PROPOFOL
Anesthesia: General

## 2021-08-31 MED ORDER — PROPOFOL 500 MG/50ML IV EMUL
INTRAVENOUS | Status: DC | PRN
  Administered 2021-08-31: 200 ug/kg/min via INTRAVENOUS

## 2021-08-31 MED ORDER — PROPOFOL 10 MG/ML IV BOLUS
INTRAVENOUS | Status: DC | PRN
  Administered 2021-08-31: 100 mg via INTRAVENOUS

## 2021-08-31 MED ORDER — LACTATED RINGERS IV SOLN: INTRAVENOUS | Status: DC

## 2021-08-31 NOTE — Anesthesia Preprocedure Evaluation (Addendum)
Anesthesia Evaluation  Patient identified by MRN, date of birth, ID band Patient awake    Reviewed: Allergy & Precautions, H&P , NPO status , Patient's Chart, lab work & pertinent test results  Airway Mallampati: III  TM Distance: >3 FB Neck ROM: Full    Dental  (+) Dental Advisory Given, Teeth Intact,    Pulmonary asthma , sleep apnea , former smoker,    Pulmonary exam normal breath sounds clear to auscultation       Cardiovascular hypertension, Pt. on medications Normal cardiovascular exam Rhythm:Regular Rate:Normal     Neuro/Psych PSYCHIATRIC DISORDERS Anxiety Depression CVA    GI/Hepatic Neg liver ROS, GERD  ,  Endo/Other  diabetes, Well Controlled, Type 2, Oral Hypoglycemic AgentsMorbid obesity  Renal/GU negative Renal ROS  negative genitourinary   Musculoskeletal  (+) Arthritis , Osteoarthritis,    Abdominal   Peds negative pediatric ROS (+)  Hematology negative hematology ROS (+)   Anesthesia Other Findings   Reproductive/Obstetrics negative OB ROS                           Anesthesia Physical Anesthesia Plan  ASA: 3  Anesthesia Plan: General   Post-op Pain Management: Minimal or no pain anticipated   Induction:   PONV Risk Score and Plan: TIVA  Airway Management Planned: Nasal Cannula, Natural Airway and Simple Face Mask  Additional Equipment:   Intra-op Plan:   Post-operative Plan:   Informed Consent: I have reviewed the patients History and Physical, chart, labs and discussed the procedure including the risks, benefits and alternatives for the proposed anesthesia with the patient or authorized representative who has indicated his/her understanding and acceptance.     Dental advisory given  Plan Discussed with: CRNA and Surgeon  Anesthesia Plan Comments:         Anesthesia Quick Evaluation

## 2021-08-31 NOTE — Discharge Instructions (Addendum)
  Colonoscopy Discharge Instructions  Read the instructions outlined below and refer to this sheet in the next few weeks. These discharge instructions provide you with general information on caring for yourself after you leave the hospital. Your doctor may also give you specific instructions. While your treatment has been planned according to the most current medical practices available, unavoidable complications occasionally occur.   ACTIVITY You may resume your regular activity, but move at a slower pace for the next 24 hours.  Take frequent rest periods for the next 24 hours.  Walking will help get rid of the air and reduce the bloated feeling in your belly (abdomen).  No driving for 24 hours (because of the medicine (anesthesia) used during the test).   Do not sign any important legal documents or operate any machinery for 24 hours (because of the anesthesia used during the test).  NUTRITION Drink plenty of fluids.  You may resume your normal diet as instructed by your doctor.  Begin with a light meal and progress to your normal diet. Heavy or fried foods are harder to digest and may make you feel sick to your stomach (nauseated).  Avoid alcoholic beverages for 24 hours or as instructed.  MEDICATIONS You may resume your normal medications unless your doctor tells you otherwise.  WHAT YOU CAN EXPECT TODAY Some feelings of bloating in the abdomen.  Passage of more gas than usual.  Spotting of blood in your stool or on the toilet paper.  IF YOU HAD POLYPS REMOVED DURING THE COLONOSCOPY: No aspirin products for 7 days or as instructed.  No alcohol for 7 days or as instructed.  Eat a soft diet for the next 24 hours.  FINDING OUT THE RESULTS OF YOUR TEST Not all test results are available during your visit. If your test results are not back during the visit, make an appointment with your caregiver to find out the results. Do not assume everything is normal if you have not heard from your  caregiver or the medical facility. It is important for you to follow up on all of your test results.  SEEK IMMEDIATE MEDICAL ATTENTION IF: You have more than a spotting of blood in your stool.  Your belly is swollen (abdominal distention).  You are nauseated or vomiting.  You have a temperature over 101.  You have abdominal pain or discomfort that is severe or gets worse throughout the day.   Your colonoscopy revealed 1 polyp(s) which I removed successfully. Await pathology results, my office will contact you. I recommend repeating colonoscopy in 5 years for surveillance purposes as well as due to family history of colon cancer.  You do have internal hemorrhoids which is likely the cause of your rectal bleeding.  If this remains an ongoing issue, we can set you up for hemorrhoid banding in our clinic.  Otherwise follow-up with GI as needed.  I hope you have a great rest of your week!  Elon Alas. Abbey Chatters, D.O. Gastroenterology and Hepatology [facility]

## 2021-08-31 NOTE — Interval H&P Note (Signed)
History and Physical Interval Note:  08/31/2021 8:51 AM  Joanna Newman  has presented today for surgery, with the diagnosis of rectal bleeding.  The various methods of treatment have been discussed with the patient and family. After consideration of risks, benefits and other options for treatment, the patient has consented to  Procedure(s) with comments: COLONOSCOPY WITH PROPOFOL (N/A) - 10:00am as a surgical intervention.  The patient's history has been reviewed, patient examined, no change in status, stable for surgery.  I have reviewed the patient's chart and labs.  Questions were answered to the patient's satisfaction.     Eloise Harman

## 2021-08-31 NOTE — Transfer of Care (Signed)
Immediate Anesthesia Transfer of Care Note  Patient: Joanna Newman  Procedure(s) Performed: COLONOSCOPY WITH PROPOFOL POLYPECTOMY  Patient Location: PACU  Anesthesia Type:General  Level of Consciousness: awake, alert , oriented and patient cooperative  Airway & Oxygen Therapy: Patient Spontanous Breathing and Patient connected to nasal cannula oxygen  Post-op Assessment: Report given to RN, Post -op Vital signs reviewed and stable and Patient moving all extremities X 4  Post vital signs: Reviewed and stable  Last Vitals:  Vitals Value Taken Time  BP 112/59 08/31/21 0944  Temp    Pulse    Resp 20 08/31/21 0944  SpO2 97 % 08/31/21 0944    Last Pain:  Vitals:   08/31/21 0944  TempSrc: Oral  PainSc: 0-No pain      Patients Stated Pain Goal: 6 (02/98/47 3085)  Complications: No notable events documented.

## 2021-08-31 NOTE — Op Note (Signed)
[facility] Patient Name: Joanna Newman Procedure Date: 08/31/2021 9:10 AM MRN: [id number] Date of Birth: Sep 22, 1971 Attending MD: Elon Alas. Abbey Chatters DO CSN: [id number] Age: 50 Admit Type: Outpatient Procedure:                Colonoscopy Indications:              Rectal bleeding Providers:                Elon Alas. Abbey Chatters, DO, Janeece Riggers, RN, Nelma Rothman,                            Technician Referring MD:              Medicines:                See the Anesthesia note for documentation of the                            administered medications Complications:            No immediate complications. Estimated Blood Loss:     Estimated blood loss was minimal. Procedure:                Pre-Anesthesia Assessment:                           - The anesthesia plan was to use monitored                            anesthesia care (MAC).                           After obtaining informed consent, the colonoscope                            was passed under direct vision. Throughout the                            procedure, the patient's blood pressure, pulse, and                            oxygen saturations were monitored continuously. The                            [id number]-[id number] ([id number]) scope was introduced through                            the anus and advanced to the the cecum, identified                            by appendiceal orifice and ileocecal valve. The                            colonoscopy was performed without difficulty. The                            patient tolerated the procedure well. The quality  of the bowel preparation was evaluated using the                            BBPS [facility] Bowel Preparation Scale) with scores                            of: Right Colon = 3, Transverse Colon = 3 and Left                            Colon = 3 (entire mucosa seen well with no residual                            staining, small fragments of stool or  opaque                            liquid). The total BBPS score equals 9. Scope In: 9:30:28 AM Scope Out: 9:39:51 AM Scope Withdrawal Time: 0 hours 7 minutes 35 seconds  Total Procedure Duration: 0 hours 9 minutes 23 seconds  Findings:      The perianal and digital rectal examinations were normal.      Non-bleeding internal hemorrhoids were found during retroflexion and       during endoscopy.      A 4 mm polyp was found in the rectum. The polyp was sessile. The polyp       was removed with a cold snare. Resection and retrieval were complete.      The exam was otherwise without abnormality. Impression:               - Non-bleeding internal hemorrhoids.                           - One 4 mm polyp in the rectum, removed with a cold                            snare. Resected and retrieved.                           - The examination was otherwise normal. Moderate Sedation:      Per Anesthesia Care Recommendation:           - Patient has a contact number available for                            emergencies. The signs and symptoms of potential                            delayed complications were discussed with the                            patient. Return to normal activities tomorrow.                            Written discharge instructions were provided to the  patient.                           - Resume previous diet.                           - Continue present medications.                           - Await pathology results.                           - Repeat colonoscopy in 5 years for surveillance                            and family history of colon cancer in mother.                           - Return to GI clinic PRN for hemorrhoid banding if                            bleeding continues. Procedure Code(s):        --- Professional ---                           [phone], Colonoscopy, flexible; with removal of                            tumor(s), polyp(s),  or other lesion(s) by snare                            technique Diagnosis Code(s):        --- Professional ---                           K62.1, Rectal polyp                           K64.8, Other hemorrhoids                           K62.5, Hemorrhage of anus and rectum CPT copyright 2019 American Medical Association. All rights reserved. The codes documented in this report are preliminary and upon coder review may  be revised to meet current compliance requirements. Elon Alas. Abbey Chatters, DO [place] Abbey Chatters, DO 08/31/2021 9:42:39 AM This report has been signed electronically. Number of Addenda: 0

## 2021-08-31 NOTE — OR Nursing (Signed)
Joanna Newman had a procedure at [facility] on 08/31/21 and cannot return to work until 09/01/21 at 11:00am.

## 2021-08-31 NOTE — Anesthesia Postprocedure Evaluation (Signed)
Anesthesia Post Note  Patient: Joanna Newman  Procedure(s) Performed: COLONOSCOPY WITH PROPOFOL POLYPECTOMY  Patient location during evaluation: Phase II Anesthesia Type: General Level of consciousness: awake and alert and oriented Pain management: pain level controlled Vital Signs Assessment: post-procedure vital signs reviewed and stable Respiratory status: spontaneous breathing, nonlabored ventilation and respiratory function stable Cardiovascular status: blood pressure returned to baseline and stable Postop Assessment: no apparent nausea or vomiting Anesthetic complications: no   No notable events documented.   Last Vitals:  Vitals:   08/31/21 0825 08/31/21 0944  BP: 133/85 (!) 112/59  Resp: 20 20  Temp: 36.9 C 36.8 C  SpO2: 98% 97%    Last Pain:  Vitals:   08/31/21 0944  TempSrc: Oral  PainSc: 0-No pain                 Jarold Macomber C Kadin Canipe

## 2021-09-01 ENCOUNTER — Other Ambulatory Visit (HOSPITAL_COMMUNITY): Payer: 59

## 2021-09-01 LAB — SURGICAL PATHOLOGY

## 2021-09-02 ENCOUNTER — Encounter (HOSPITAL_COMMUNITY): Payer: Self-pay | Admitting: Internal Medicine

## 2021-09-23 ENCOUNTER — Other Ambulatory Visit: Payer: Self-pay

## 2021-09-23 ENCOUNTER — Ambulatory Visit (INDEPENDENT_AMBULATORY_CARE_PROVIDER_SITE_OTHER): Payer: 59 | Admitting: Adult Health

## 2021-09-23 ENCOUNTER — Other Ambulatory Visit (HOSPITAL_COMMUNITY)
Admission: RE | Admit: 2021-09-23 | Discharge: 2021-09-23 | Disposition: A | Discharge status: Home / Self Care | Payer: 59 | Source: Ambulatory Visit | Attending: Adult Health | Admitting: Adult Health

## 2021-09-23 ENCOUNTER — Encounter: Payer: Self-pay | Admitting: Adult Health

## 2021-09-23 VITALS — BP 149/86 | HR 80 | Ht 63.5 in | Wt 299.8 lb

## 2021-09-23 DIAGNOSIS — R4589 Other symptoms and signs involving emotional state: Secondary | ICD-10-CM | POA: Diagnosis not present

## 2021-09-23 DIAGNOSIS — N926 Irregular menstruation, unspecified: Secondary | ICD-10-CM | POA: Insufficient documentation

## 2021-09-23 DIAGNOSIS — Z124 Encounter for screening for malignant neoplasm of cervix: Secondary | ICD-10-CM | POA: Diagnosis present

## 2021-09-23 DIAGNOSIS — N951 Menopausal and female climacteric states: Secondary | ICD-10-CM | POA: Insufficient documentation

## 2021-09-23 DIAGNOSIS — N946 Dysmenorrhea, unspecified: Secondary | ICD-10-CM

## 2021-09-23 DIAGNOSIS — R232 Flushing: Secondary | ICD-10-CM

## 2021-09-23 DIAGNOSIS — Z8673 Personal history of transient ischemic attack (TIA), and cerebral infarction without residual deficits: Secondary | ICD-10-CM
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
## 2021-09-24 ENCOUNTER — Telehealth: Payer: Self-pay | Admitting: Adult Health

## 2021-09-24 NOTE — Telephone Encounter (Signed)
Patient is calling stating that she is got up this morning and she is bleeding even worse now. Stated that she soaked through a pad last night. Stating that in with in a hour and half she has soaked through pads and clothes
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
## 2021-09-25 ENCOUNTER — Telehealth: Payer: Self-pay | Admitting: Adult Health

## 2021-09-25 ENCOUNTER — Telehealth: Payer: Self-pay | Admitting: Internal Medicine

## 2021-09-25 LAB — CYTOLOGY - PAP
Adequacy: ABSENT
Comment: NEGATIVE
Diagnosis: NEGATIVE
High risk HPV: NEGATIVE

## 2021-09-25 MED ORDER — MEGESTROL ACETATE 40 MG PO TABS: ORAL_TABLET | ORAL | 0 refills | Status: DC

## 2021-09-25 NOTE — Telephone Encounter (Signed)
Bleeding is less,feels better, light cramping. Tired of bleeding, has [place] Monday, will rx megace

## 2021-09-25 NOTE — Telephone Encounter (Signed)
Returned the pt's call and advised of her result note and repeat colonoscopy in 5 years. Pt expressed understanding.

## 2021-09-25 NOTE — Telephone Encounter (Signed)
PATIENT CALLING FOR HER PROCEDURE RESULTS

## 2021-09-28 ENCOUNTER — Ambulatory Visit (HOSPITAL_COMMUNITY): Payer: 59

## 2021-10-07 ENCOUNTER — Encounter: Payer: 59 | Admitting: Gastroenterology

## 2021-10-13 ENCOUNTER — Other Ambulatory Visit: Payer: Self-pay

## 2021-10-13 ENCOUNTER — Ambulatory Visit (HOSPITAL_COMMUNITY)
Admission: RE | Admit: 2021-10-13 | Discharge: 2021-10-13 | Disposition: A | Discharge status: Home / Self Care | Payer: 59 | Source: Ambulatory Visit | Attending: Adult Health | Admitting: Adult Health

## 2021-10-13 DIAGNOSIS — N926 Irregular menstruation, unspecified: Secondary | ICD-10-CM | POA: Insufficient documentation

## 2021-10-13 DIAGNOSIS — N946 Dysmenorrhea, unspecified: Secondary | ICD-10-CM | POA: Insufficient documentation

## 2021-10-14 ENCOUNTER — Telehealth: Payer: Self-pay | Admitting: Adult Health

## 2021-10-14 NOTE — Telephone Encounter (Signed)
Patient called for u/s results. Please advise.

## 2021-10-23 ENCOUNTER — Other Ambulatory Visit: Payer: Self-pay | Admitting: Adult Health

## 2021-10-23 MED ORDER — MEGESTROL ACETATE 40 MG PO TABS: ORAL_TABLET | ORAL | 1 refills | Status: DC

## 2021-10-23 NOTE — Progress Notes (Signed)
Will refill megace 

## 2021-10-30 ENCOUNTER — Other Ambulatory Visit: Payer: Self-pay

## 2021-10-30 ENCOUNTER — Ambulatory Visit (INDEPENDENT_AMBULATORY_CARE_PROVIDER_SITE_OTHER): Payer: 59 | Admitting: Obstetrics & Gynecology

## 2021-10-30 ENCOUNTER — Encounter: Payer: Self-pay | Admitting: Obstetrics & Gynecology

## 2021-10-30 VITALS — BP 161/98 | HR 70 | Ht 64.0 in | Wt 306.0 lb

## 2021-10-30 DIAGNOSIS — D219 Benign neoplasm of connective and other soft tissue, unspecified: Secondary | ICD-10-CM

## 2021-10-30 DIAGNOSIS — N938 Other specified abnormal uterine and vaginal bleeding: Secondary | ICD-10-CM

## 2021-10-30 DIAGNOSIS — N946 Dysmenorrhea, unspecified: Secondary | ICD-10-CM | POA: Diagnosis not present

## 2021-10-30 MED ORDER — MYFEMBREE 40-1-0.5 MG PO TABS: 1.0000 | ORAL_TABLET | Freq: Every day | ORAL | 11 refills | Status: DC

## 2021-10-30 NOTE — Progress Notes (Signed)
Follow up appointment for results  Chief Complaint  Patient presents with   discuss options    Multiple fibroids, ultrasound done 10-13-21    Blood pressure (!) 161/98, pulse 70, height 5\' 4"  (1.626 m), weight (!) 306 lb (138.8 kg), last menstrual period 09/10/2021.  US PELVIC COMPLETE WITH TRANSVAGINAL  Result Date: 10/13/2021 CLINICAL DATA:  Irregular menstrual bleeding and cramping, bleeding 1 week ago, LMP 1 year ago, prior RIGHT salpingo oophorectomy 2019, diabetes mellitus, hypertension, former smoker, morbid obesity EXAM: TRANSABDOMINAL AND TRANSVAGINAL ULTRASOUND OF PELVIS TECHNIQUE: Both transabdominal and transvaginal ultrasound examinations of the pelvis were performed. Transabdominal technique was performed for global imaging of the pelvis including uterus, ovaries, adnexal regions, and pelvic cul-de-sac. It was necessary to proceed with endovaginal exam following the transabdominal exam to visualize the uterus, endometrium, and ovaries. COMPARISON:  12/31/2017 FINDINGS: Uterus Measurements: 19.8 x 7.5 x 10.9 cm = volume: 851 mL. Anteverted. Large uterine masses consistent with leiomyomata. These include a posterior lower uterine segment transmural leiomyoma extending posterior to the cervix, 8.5 x 5.6 x 6.6 cm, exophytic fundal leiomyoma 6.3 x 3.6 x 5.0 cm, and a subserosal posterior upper uterine leiomyoma 7.3 x 6.0 x 9.0 cm. Endometrium Obscured by multiple uterine leiomyomata Right ovary Surgically absent Left ovary Not visualized, likely obscured by a combination of enlarged uterus containing multiple masses and gas in adjacent bowel loops Other findings No free pelvic fluid.  No adnexal masses definitely visualized. IMPRESSION: Surgical absence of RIGHT ovary and nonvisualization of LEFT ovary. Enlarged uterus containing multiple leiomyomata. Inadequately visualized endometrial complex and LEFT ovary. Electronically Signed   By: Lavonia Dana M.D.   On: 10/13/2021 15:43     Pt has had  unsatisfactory response to the megestrol therapy Still bleeding essentially daily  Some clots Heavy cramps Pelvic pressure All have continued maybe some improvement but not substantial  MEDS ordered this encounter: Meds ordered this encounter  Medications   Relugolix-Estradiol-Norethind (MYFEMBREE) 40-1-0.5 MG TABS    Sig: Take 1 tablet by mouth daily.    Dispense:  30 tablet    Refill:  11    Orders for this encounter: No orders of the defined types were placed in this encounter.   Impression:   ICD-10-CM   1. Fibroids, 851 cc  D21.9    Begin Myfembree to try to conservativley manage through menopause    2. Dysmenorrhea  N94.6     3. DUB (dysfunctional uterine bleeding)  N93.8          Plan: Will transition to MyFembree from megestrol Due to significant co morbidities will exhaust conservative measures especially since patient is this close to menopause Patient voices wanting definitive therapy surgical but understands my main objective is to try to keep her safe while managing  She is directed to send MyChart message if there are issues or questions with the myfembree Recommend to continue megestrol 40 mg daily the first month to make the transition smoother  Follow Up: Return in about 3 months (around 01/28/2022) for Follow up, with Dr Elonda Husky.      All questions were answered.  Past Medical History:  Diagnosis Date   Anxiety    Arthritis    Asthma    Bilateral ovarian cysts    Depression    Diabetes mellitus without complication (HCC)    Epidermoid cyst of finger    tip of left little finger   Fibroids    GERD (gastroesophageal reflux disease)    Hyperlipidemia  Hypertension    Insomnia    LVH (left ventricular hypertrophy)    Moderate   Obesity    Pain of right heel    Sleep apnea    History of no problems since weight loss   Stroke ([place]) 07/2016   No residual   Substance abuse ([place])    cocaine, marijuana and alcohol abuse. quit end of 2012     Past Surgical History:  Procedure Laterality Date   CHOLECYSTECTOMY     COLONOSCOPY WITH PROPOFOL N/A 08/31/2021   Procedure: COLONOSCOPY WITH PROPOFOL;  Surgeon: Eloise Harman, DO;  [facility];  Service: Endoscopy;  Laterality: N/A;  10:00am   HEEL SPUR SURGERY Bilateral    KNEE SURGERY     LAPAROTOMY WITH STAGING N/A 02/14/2018   Procedure: MINI-LAPAROTOMY;  Surgeon: Everitt Amber, MD;  Location: [facility];  Service: Gynecology;  Laterality: N/A;   LYSIS OF ADHESION  02/14/2018   Procedure: LYSIS OF ADHESION;  Surgeon: Everitt Amber, MD;  Location: [facility];  Service: Gynecology;;   POLYPECTOMY  08/31/2021   Procedure: POLYPECTOMY;  Surgeon: Eloise Harman, DO;  [facility]: [facility];  Service: Endoscopy;;   ROBOTIC ASSISTED TOTAL HYSTERECTOMY WITH BILATERAL SALPINGO OOPHERECTOMY N/A 02/14/2018   Procedure: XI ROBOTIC ASSISTED RIGHT SALPINGO OOPHORECTOMY, OMENTECTOMY;  Surgeon: Everitt Amber, MD;  Location: [facility];  Service: Gynecology;  Laterality: N/A;   TUBAL LIGATION      OB History     Gravida  5   Para  3   Term  3   Preterm      AB  2   Living  3      SAB      IAB  2   Ectopic      Multiple      Live Births              Allergies  Allergen Reactions   Dapagliflozin Other (See Comments)    Yeast infection   Lisinopril Swelling    Social History   Socioeconomic History   Marital status: Married    Spouse name: Not on file   Number of children: Not on file   Years of education: Not on file   Highest education level: Not on file  Occupational History   Occupation: event specialist  Tobacco Use   Smoking status: Former    Packs/day: 0.25    Years: 15.00    Pack years: 3.75    Types: Cigarettes    Quit date: 03/11/2014    Years since quitting: 7.6   Smokeless tobacco: Never  Vaping Use   Vaping Use: Never used  Substance and Sexual Activity   Alcohol use: Yes    Comment: occ; more in past   Drug use: Not Currently    Types:  Marijuana    Comment: Last use 08/31/21 history of substance abuse; last marijuana use about 1 week ago   Sexual activity: Not Currently    Birth control/protection: Surgical, Abstinence    Comment: tubal  Other Topics Concern   Not on file  Social History Narrative   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   Social Determinants of Health   Financial Resource Strain: Not on file  Food Insecurity: Not on file  Transportation Needs: Not on file  Physical Activity: Not on file  Stress: Not on file  Social Connections: Not on file    Family History  Problem Relation Age of Onset  Diabetes Paternal Grandmother    Diabetes Maternal Grandmother    CVA Maternal Grandmother 50   Heart disease Father 80       heart failure   Diabetes Father    Heart failure Father    Hypertension Mother    Cancer Mother 46       colon   Kidney failure Sister    CVA Maternal Aunt 57       Aneurysm

## 2021-11-06 ENCOUNTER — Encounter: Payer: Self-pay | Admitting: Obstetrics & Gynecology

## 2021-11-10 ENCOUNTER — Telehealth: Payer: Self-pay

## 2021-11-10 NOTE — Telephone Encounter (Signed)
[facility] regarding Myfembree prescription, no answer, left vm.

## 2021-11-16 ENCOUNTER — Other Ambulatory Visit: Payer: Self-pay | Admitting: Obstetrics & Gynecology

## 2021-11-16 MED ORDER — MEGESTROL ACETATE 40 MG PO TABS
ORAL_TABLET | ORAL | 3 refills | Status: DC
Start: 2021-11-16 — End: 2022-04-08

## 2021-11-28 ENCOUNTER — Other Ambulatory Visit: Payer: Self-pay | Admitting: Adult Health

## 2021-11-28 ENCOUNTER — Encounter: Payer: Self-pay | Admitting: Obstetrics & Gynecology

## 2021-12-01 ENCOUNTER — Telehealth: Payer: Self-pay

## 2021-12-01 NOTE — Telephone Encounter (Signed)
Pt called and stated that she needs somebody to please call her about her medications ASAP.

## 2021-12-01 NOTE — Telephone Encounter (Signed)
Pt states that she doesn't want to get the myfembree because it is going to cost too much and with the program they are only going to cover it for 2 months. She feels like she isn't being treated ethically and is just being Dr. Chipper Oman test rat. He is billing her for visits and not doing anything for her. She wants him to make a definitive plan such as surgery. I advised patient I would route message to Dr. Elonda Husky. Pt then ended call.

## 2021-12-07 ENCOUNTER — Telehealth: Payer: Self-pay

## 2021-12-07 NOTE — Telephone Encounter (Signed)
Pt called and wants a nurse to call her about her prescriptions.  Pt states that the quantity is wrong and she is having trouble getting it filled.

## 2021-12-07 NOTE — Telephone Encounter (Signed)
Returned pt's call, no answer, left vm 

## 2021-12-08 ENCOUNTER — Other Ambulatory Visit: Payer: Self-pay | Admitting: Obstetrics & Gynecology

## 2021-12-08 MED ORDER — MEGESTROL ACETATE 40 MG PO TABS: ORAL_TABLET | ORAL | 3 refills | Status: DC

## 2021-12-29 ENCOUNTER — Encounter: Payer: Self-pay | Admitting: Obstetrics & Gynecology

## 2021-12-30 ENCOUNTER — Telehealth: Payer: 59 | Admitting: Physician Assistant

## 2021-12-30 DIAGNOSIS — R102 Pelvic and perineal pain: Secondary | ICD-10-CM

## 2021-12-30 MED ORDER — KETOROLAC TROMETHAMINE 10 MG PO TABS: 10.0000 mg | ORAL_TABLET | Freq: Three times a day (TID) | ORAL | 0 refills | Status: DC | PRN

## 2021-12-30 NOTE — Addendum Note (Signed)
Addended by: Florian Buff on: 12/30/2021 04:02 PM ? ? Modules accepted: Orders ? ?

## 2021-12-30 NOTE — Progress Notes (Signed)
Based on what you shared with me, I feel your condition warrants further evaluation and I recommend that you be seen in a face to face visit. ? ?Giving recurring pelvic pain and vaginal discomfort, you will need to follow-up with your GYN for further evaluation and management, or be evaluated in person at local Urgent Care.  We can only treat simple causes of vaginal discharge like yeast or BV, not vaginal/pelvic pain via e-visit.  ?  ?NOTE: There will be NO CHARGE for this eVisit ?  ?If you are having a true medical emergency please call 911.   ?  ? For an urgent face to face visit, [place] has six urgent care centers for your convenience:  ?  ? [facility] [place] [facility] ?Get Driving Directions ?[phone] ?[address] ?[place], [place] [address] ?  ? [place] [facility]) ?Get Driving Directions ?[phone] ?[address] ?[place], [place] [address] ? ?[facility] [place] ([place]) ?Get Driving Directions ?[place],  [place]  [address] ? ?[facility] ?Get Driving Directions ?[phone] ?[address] ?[place], [place] [address] ?  ?[facility] [place] ?Get Driving Directions  ?[phone] ?[name] ?[address] ?[place], [place] [address] ?  ?[facility] at [facility] ?Get Driving Directions ?[phone] ?[address] ?[place],  [address] ? ?Your MyChart E-visit questionnaire answers were reviewed by a board certified advanced clinical practitioner to complete your personal care plan based on your specific symptoms.  Thank you for using e-Visits. ?  ? ?

## 2022-01-05 ENCOUNTER — Ambulatory Visit: Payer: 59 | Admitting: Obstetrics & Gynecology

## 2022-01-28 ENCOUNTER — Ambulatory Visit: Payer: 59 | Admitting: Obstetrics & Gynecology

## 2022-03-24 ENCOUNTER — Encounter (HOSPITAL_COMMUNITY): Payer: Self-pay | Admitting: *Deleted

## 2022-03-24 ENCOUNTER — Other Ambulatory Visit: Payer: Self-pay

## 2022-03-24 ENCOUNTER — Emergency Department (HOSPITAL_COMMUNITY)
Admission: EM | Admit: 2022-03-24 | Discharge: 2022-03-24 | Disposition: A | Discharge status: Home / Self Care | Payer: 59 | Attending: Emergency Medicine | Admitting: Emergency Medicine

## 2022-03-24 ENCOUNTER — Emergency Department (HOSPITAL_COMMUNITY): Payer: 59

## 2022-03-24 DIAGNOSIS — D259 Leiomyoma of uterus, unspecified: Secondary | ICD-10-CM | POA: Insufficient documentation

## 2022-03-24 DIAGNOSIS — L292 Pruritus vulvae: Secondary | ICD-10-CM | POA: Insufficient documentation

## 2022-03-24 DIAGNOSIS — Z7984 Long term (current) use of oral hypoglycemic drugs: Secondary | ICD-10-CM | POA: Diagnosis not present

## 2022-03-24 DIAGNOSIS — I10 Essential (primary) hypertension: Secondary | ICD-10-CM | POA: Diagnosis not present

## 2022-03-24 DIAGNOSIS — R079 Chest pain, unspecified: Secondary | ICD-10-CM | POA: Diagnosis present

## 2022-03-24 DIAGNOSIS — Z7982 Long term (current) use of aspirin: Secondary | ICD-10-CM | POA: Insufficient documentation

## 2022-03-24 DIAGNOSIS — Z79899 Other long term (current) drug therapy: Secondary | ICD-10-CM | POA: Diagnosis not present

## 2022-03-24 DIAGNOSIS — E119 Type 2 diabetes mellitus without complications: Secondary | ICD-10-CM | POA: Insufficient documentation

## 2022-03-24 DIAGNOSIS — N898 Other specified noninflammatory disorders of vagina: Secondary | ICD-10-CM

## 2022-03-24 LAB — CBC
HCT: 39.5 % (ref 36.0–46.0)
Hemoglobin: 12.7 g/dL (ref 12.0–15.0)
MCH: 28.6 pg (ref 26.0–34.0)
MCHC: 32.2 g/dL (ref 30.0–36.0)
MCV: 89 fL (ref 80.0–100.0)
Platelets: 341 10*3/uL (ref 150–400)
RBC: 4.44 MIL/uL (ref 3.87–5.11)
RDW: 14.4 % (ref 11.5–15.5)
WBC: 7.8 10*3/uL (ref 4.0–10.5)
nRBC: 0 % (ref 0.0–0.2)

## 2022-03-24 LAB — POC URINE PREG, ED: Preg Test, Ur: NEGATIVE

## 2022-03-24 LAB — TROPONIN I (HIGH SENSITIVITY)
Troponin I (High Sensitivity): 2 ng/L (ref <18)
Troponin I (High Sensitivity): 2 ng/L (ref <18)

## 2022-03-24 LAB — BASIC METABOLIC PANEL
Anion gap: 8 (ref 5–15)
BUN: 12 mg/dL (ref 6–20)
CO2: 22 mmol/L (ref 22–32)
Calcium: 8.9 mg/dL (ref 8.9–10.3)
Chloride: 108 mmol/L (ref 98–111)
Creatinine, Ser: 0.98 mg/dL (ref 0.44–1.00)
GFR, Estimated: >60 mL/min (ref >60)
Glucose, Bld: 108 mg/dL — ABNORMAL HIGH (ref 70–99)
Potassium: 3.9 mmol/L (ref 3.5–5.1)
Sodium: 138 mmol/L (ref 135–145)

## 2022-03-24 LAB — WET PREP, GENITAL
Clue Cells Wet Prep HPF POC: NONE SEEN
Sperm: NONE SEEN
Trich, Wet Prep: NONE SEEN
WBC, Wet Prep HPF POC: <10 (ref <10)
Yeast Wet Prep HPF POC: NONE SEEN

## 2022-03-24 MED ORDER — HYDROCODONE-ACETAMINOPHEN 5-325 MG PO TABS: 1.0000 | ORAL_TABLET | ORAL | 0 refills | Status: DC | PRN

## 2022-03-24 MED ORDER — HYDROCODONE-ACETAMINOPHEN 5-325 MG PO TABS
1.0000 | ORAL_TABLET | Freq: Once | ORAL | Status: AC
  Administered 2022-03-24: 1 via ORAL
  Filled 2022-03-24: qty 1

## 2022-03-24 NOTE — Discharge Instructions (Addendum)
Your work-up tonight is reassuring and normal.  However your blood pressure is elevated make sure you are taking your amlodipine.  You have been prescribed hydrocodone for nighttime assistance with your suspected fibroid pelvic pain.  Call Dr. Elonda Husky to arrange definitive treatment of this problem.  You have also been given a referral to our local cardiologist for further cardiac testing.  Your EKG and your troponin testing tonight is reassuring, but an exercise treadmill test would ultimately help determine cardiac health.  Please call to arrange an appointment to discuss having this test completed.  You do not have a vaginal infection.  I would suggest trying an over-the-counter preparation such as vaginocele to help with vaginal irritation.  An antibiotic will not help you with your symptoms at this time.

## 2022-03-24 NOTE — ED Notes (Signed)
In room to start IV on patient and she states that she does not need that. Only wants pain meds and treatment for BV. States that she is ready to go home.

## 2022-03-24 NOTE — ED Notes (Signed)
Assisted to BR, requesting to be checked for BV.

## 2022-03-24 NOTE — ED Triage Notes (Addendum)
Pt with right arm heaviness and chest heaviness started while at work today at 1200. C/o fatigue and dizziness.  + abd pain, hx of fibroids, states pain has gotten worse, with vaginal bleeding 2 days ago.  +lower back pain + pelvic pressure +nausea Denies emesis

## 2022-03-25 NOTE — ED Provider Notes (Addendum)
[facility] EMERGENCY DEPARTMENT Provider Note   CSN: [id number] Arrival date & time: 03/24/22  1505     History  Chief Complaint  Patient presents with   Abdominal Pain   Chest Pain    Right, rt arm heaviness    Joanna Newman is a 51 y.o. 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 First,  describes right sided chest and arm heaviness which started around noon while sitting at her desk at work, denies weakness or numbness, also no sob, palpitations. Symptoms have resolved, lasted for less than 2 hours, resolved spontaneously. Secondly reports low pelvic pressure with radiation into her lower back and reports has been menstruating for the past 2 days.  Menses has been irregular as she is perimenopausal, also has known large fibroids which have managed with megace in the past, under the care of Dr.Eure, trying to avoid surgery.  She states her pelvic symptoms are chronic and similar to prior problems with the fibroid concern.  She does have intermittent lightheadedness and is concerned about anemia from blood loss. She also has some nausea, no emesis, no dysuria or bowel changes.  She has had vaginal itching and would to be checked for bv which she has had previously. Denies vaginal dc, denies risks for std's.  She is planning to see Dr. Elonda Husky again next month when her insurance will cover visit.    The history is provided by the patient.       Home Medications Prior to Admission medications   Medication Sig Start Date End Date Taking? Authorizing Provider  HYDROcodone-acetaminophen (NORCO/VICODIN) 5-325 MG tablet Take 1 tablet by mouth every 4 (four) hours as needed. 03/24/22  Yes Lajoyce Tamura, Almyra Free, PA-C  albuterol (VENTOLIN HFA) 108 (90 Base) MCG/ACT inhaler Inhale 2 puffs into the lungs every 4 (four) hours as needed for wheezing or shortness of breath.  06/27/20   [provider]  amLODipine (NORVASC) 2.5 MG tablet Take 1 tablet (2.5 mg total) by mouth daily. 08/06/16   Verlee Monte, MD  aspirin EC 325 MG EC tablet Take 1 tablet (325 mg total) by mouth daily. 08/06/16   Verlee Monte, MD  atorvastatin (LIPITOR) 20 MG tablet Take 1 tablet (20 mg total) by mouth daily at 6 PM. 08/06/16   Verlee Monte, MD  clobetasol cream (TEMOVATE) 0.35 % Apply 1 application topically daily as needed (eczema).    [provider]  diclofenac Sodium (VOLTAREN) 1 % GEL Apply 2 g topically 4 (four) times daily. Patient not taking: Reported on 10/30/2021 01/26/21   Rodriguez-Southworth, Sunday Spillers, PA-C  fluticasone (CUTIVATE) 0.05 % cream Apply topically 2 (two) times daily. For one week then daily, gradually taper use Patient taking differently: Apply 1 application topically daily as needed (eczema). 01/26/21   Estill Dooms, NP  ketorolac (TORADOL) 10 MG tablet Take 1 tablet (10 mg total) by mouth every 8 (eight) hours as needed. 12/30/21   Florian Buff, MD  megestrol (MEGACE) 40 MG tablet Take 3 x 5 days then 2 x 5 days then 1 daily 11/16/21   Florian Buff, MD  megestrol (MEGACE) 40 MG tablet 3 tablets a day 12/08/21   Florian Buff, MD  metFORMIN (GLUCOPHAGE) 500 MG tablet Take 500 mg by mouth 2 (two) times daily.     [provider]  montelukast (SINGULAIR) 10 MG tablet Take 10 mg by  mouth daily. 03/01/20   [provider]  Relugolix-Estradiol-Norethind (MYFEMBREE) 40-1-0.5 MG TABS Take 1 tablet by mouth daily. 10/30/21   Florian Buff, MD  venlafaxine XR (EFFEXOR-XR) 75 MG 24 hr capsule Take 2 capsules (150 mg total) by mouth daily with breakfast. Patient taking differently: Take 75 mg by mouth daily with breakfast. 04/26/19   Emily Filbert, MD      Allergies    Dapagliflozin and Lisinopril    Review of Systems   Review of Systems  Constitutional:  Negative for chills and fever.  HENT:  Negative for congestion and sore  throat.   Eyes: Negative.   Respiratory:  Negative for chest tightness and shortness of breath.   Cardiovascular:  Negative for chest pain.  Gastrointestinal:  Positive for nausea. Negative for abdominal pain, constipation, diarrhea and vomiting.  Genitourinary:  Positive for pelvic pain and vaginal bleeding. Negative for dysuria and vaginal discharge.  Musculoskeletal:  Negative for arthralgias, joint swelling and neck pain.  Skin: Negative.  Negative for rash and wound.  Neurological:  Positive for light-headedness. Negative for dizziness, facial asymmetry, speech difficulty, weakness, numbness and headaches.  Psychiatric/Behavioral: Negative.    All other systems reviewed and are negative.   Physical Exam Updated Vital Signs BP (!) 154/100 (BP Location: Right Arm)   Pulse 74   Temp 97.8 F (36.6 C)   Resp 14   Ht '5\' 3"'$  (1.6 m)   Wt 131.5 kg   SpO2 100%   BMI 51.37 kg/m  Physical Exam Vitals and nursing note reviewed.  Constitutional:      Appearance: She is well-developed.  HENT:     Head: Normocephalic and atraumatic.  Eyes:     Conjunctiva/sclera: Conjunctivae normal.  Cardiovascular:     Rate and Rhythm: Normal rate and regular rhythm.     Heart sounds: Normal heart sounds.  Pulmonary:     Effort: Pulmonary effort is normal.     Breath sounds: Normal breath sounds. No wheezing.  Abdominal:     General: Abdomen is protuberant. Bowel sounds are normal.     Palpations: Abdomen is soft.     Tenderness: There is abdominal tenderness in the suprapubic area.  Musculoskeletal:        General: Normal range of motion.     Cervical back: Normal range of motion.  Skin:    General: Skin is warm and dry.  Neurological:     General: No focal deficit present.     Mental Status: She is alert and oriented to person, place, and time.     Cranial Nerves: Cranial nerves 2-12 are intact. No facial asymmetry.     Sensory: Sensation is intact.     Motor: Motor function is intact.      Gait: Gait is intact.     ED Results / Procedures / Treatments   Labs (all labs ordered are listed, but only abnormal results are displayed) Labs Reviewed  BASIC METABOLIC PANEL - Abnormal; Notable for the following components:      Result Value   Glucose, Bld 108 (*)    All other components within normal limits  WET PREP, GENITAL  CBC  POC URINE PREG, ED  TROPONIN I (HIGH SENSITIVITY)  TROPONIN I (HIGH SENSITIVITY)    EKG EKG Interpretation  Date/Time:  Wednesday March 24 2022 15:38:34 EDT Ventricular Rate:  76 PR Interval:  112 QRS Duration: 82 QT Interval:  366 QTC Calculation: 411 R Axis:   2 Text Interpretation:  Normal sinus rhythm Cannot rule out Anterior infarct , age undetermined no acute ST/T changes T wave abnormalities appear improved compared to Nov 2022 Confirmed by Sherwood Gambler [phone]) on 03/24/2022 7:31:10 PM  Radiology DG Chest 2 View  Result Date: 03/24/2022 CLINICAL DATA:  Shortness of breath and chest pain. EXAM: CHEST - 2 VIEW COMPARISON:  11/08/2014 FINDINGS: The cardiac silhouette, mediastinal and hilar contours are within normal limits given the AP projection. The lungs are clear of an acute process. No pleural effusions or pulmonary lesions. The bony thorax is intact. IMPRESSION: No acute cardiopulmonary findings. Electronically Signed   By: Marijo Sanes M.D.   On: 03/24/2022 16:07    Procedures Procedures    Medications Ordered in ED Medications  HYDROcodone-acetaminophen (NORCO/VICODIN) 5-325 MG per tablet 1 tablet (1 tablet Oral Given 03/24/22 1928)    ED Course/ Medical Decision Making/ A&P                           Medical Decision Making Pt with multiple complaints per hpi,  right sided chest and arm heaviness without focal weakness or numbness, now resolved.  No chest pain, no sob with exam,  VSS, except bp has been elevated here.  Delta troponins are negative, doubt todays event represented angina event.  She is currently sx free  with chest and arm sx.  Low pelvic pain c/w her prior known fibroid disease.  Labs are reasuring, she is not pregnant, hgb normal range, not anemic from blood loss.  She has complaint of frequent vaginal/perivaginal itching, no dc, no risks for stds.  Wet prep self obtained, negative for yeast and bv.  Encouraged topical vagisil for itch relief, also encouraged to proceed with f/u Dr. Elonda Husky as soon as she is able for definitive care/tx of the fibroid concerns.  Amount and/or Complexity of Data Reviewed External Data Reviewed: notes.    Details: review of [facility] notes re prior tx of fibroids. Labs: ordered.    Details: reviewed, delta troponins negative x 2, electrolytes stable, hgb normal at 12.7 Radiology: ordered and independent interpretation performed.    Details: no acute cardiopulmonary disease  Risk Prescription drug management. Risk Details: Pt expresses sig concerns sleeping secondary to fibroid discomfort, prescribed small quantity of hydrocodone. Plan f/u Dr Elonda Husky.  Also given ambulatory referral to cardiology, Heart score 3 with normal delta troponins.  Pt would benefit from outpatient cardiac risk stratification.             Final Clinical Impression(s) / ED Diagnoses Final diagnoses:  Vaginal itching  Chest pain, unspecified type  Uterine leiomyoma, unspecified location    Rx / DC Orders ED Discharge Orders          Ordered    HYDROcodone-acetaminophen (NORCO/VICODIN) 5-325 MG tablet  Every 4 hours PRN        03/24/22 2035    Ambulatory referral to Cardiology       Comments: If you have not heard from the Cardiology office within the next 72 hours please call [phone].   03/24/22 2041              Evalee Jefferson, PA-C 03/25/22 1442    Evalee Jefferson, PA-C 03/25/22 1443    Sherwood Gambler, MD 03/25/22 2128

## 2022-04-01 ENCOUNTER — Encounter: Payer: Self-pay | Admitting: Obstetrics & Gynecology

## 2022-04-01 ENCOUNTER — Ambulatory Visit: Payer: 59 | Admitting: Obstetrics & Gynecology

## 2022-04-01 VITALS — BP 150/106 | HR 80 | Wt 305.0 lb

## 2022-04-01 DIAGNOSIS — D219 Benign neoplasm of connective and other soft tissue, unspecified: Secondary | ICD-10-CM | POA: Diagnosis not present

## 2022-04-01 DIAGNOSIS — N946 Dysmenorrhea, unspecified: Secondary | ICD-10-CM

## 2022-04-01 DIAGNOSIS — Z8673 Personal history of transient ischemic attack (TIA), and cerebral infarction without residual deficits: Secondary | ICD-10-CM

## 2022-04-01 DIAGNOSIS — N938 Other specified abnormal uterine and vaginal bleeding: Secondary | ICD-10-CM | POA: Diagnosis not present

## 2022-04-01 MED ORDER — GERHARDT'S BUTT CREAM: 1.0000 | TOPICAL_CREAM | Freq: Three times a day (TID) | CUTANEOUS | 11 refills | Status: DC

## 2022-04-04 NOTE — Progress Notes (Deleted)
Cardiology Office Note   Date:  04/04/2022   ID:  Joanna Newman, Joanna Newman Nov 04, 1970, MRN [id number]  PCP:  Aurea Graff.Marlou Sa, MD  Cardiologist:   Wyonia Fontanella [place], MD   No chief complaint on file.     History of Present Illness: Joanna Newman is a 51 y.o. female who is seen at the request of Dr Alroy Dust for evaluation of chest pain. She has a history of DM, HTN, HLD and prior stroke.     Past Medical History:  Diagnosis Date   Anxiety    Arthritis    Asthma    Bilateral ovarian cysts    Depression    Diabetes mellitus without complication ([place])    Epidermoid cyst of finger    tip of left little finger   Fibroids    GERD (gastroesophageal reflux disease)    Hyperlipidemia    Hypertension    Insomnia    LVH (left ventricular hypertrophy)    Moderate   Obesity    Pain of right heel    Sleep apnea    History of no problems since weight loss   Stroke ([place]) 07/2016   No residual   Substance abuse ([place])    cocaine, marijuana and alcohol abuse. quit end of 2012    Past Surgical History:  Procedure Laterality Date   CHOLECYSTECTOMY     COLONOSCOPY WITH PROPOFOL N/A 08/31/2021   Procedure: COLONOSCOPY WITH PROPOFOL;  Surgeon: Eloise Harman, DO;  Location: [facility];  Service: Endoscopy;  Laterality: N/A;  10:00am   HEEL SPUR SURGERY Bilateral    KNEE SURGERY     LAPAROTOMY WITH STAGING N/A 02/14/2018   Procedure: MINI-LAPAROTOMY;  Surgeon: Everitt Amber, MD;  Location: [facility];  Service: Gynecology;  Laterality: N/A;   LYSIS OF ADHESION  02/14/2018   Procedure: LYSIS OF ADHESION;  Surgeon: Everitt Amber, MD;  Location: [facility];  Service: Gynecology;;   POLYPECTOMY  08/31/2021   Procedure: POLYPECTOMY;  Surgeon: Eloise Harman, DO;  Location: [facility];  Service: Endoscopy;;   ROBOTIC ASSISTED TOTAL HYSTERECTOMY WITH BILATERAL SALPINGO OOPHERECTOMY N/A 02/14/2018   Procedure: XI ROBOTIC ASSISTED RIGHT SALPINGO OOPHORECTOMY, OMENTECTOMY;  Surgeon:  Everitt Amber, MD;  Location: [facility];  Service: Gynecology;  Laterality: N/A;   TUBAL LIGATION       Current Outpatient Medications  Medication Sig Dispense Refill   albuterol (VENTOLIN HFA) 108 (90 Base) MCG/ACT inhaler Inhale 2 puffs into the lungs every 4 (four) hours as needed for wheezing or shortness of breath.     amLODipine (NORVASC) 2.5 MG tablet Take 1 tablet (2.5 mg total) by mouth daily. 30 tablet 0   aspirin EC 325 MG EC tablet Take 1 tablet (325 mg total) by mouth daily.     atorvastatin (LIPITOR) 20 MG tablet Take 1 tablet (20 mg total) by mouth daily at 6 PM. 30 tablet 0   clobetasol cream (TEMOVATE) 8.09 % Apply 1 application topically daily as needed (eczema).     diclofenac Sodium (VOLTAREN) 1 % GEL Apply 2 g topically 4 (four) times daily. (Patient not taking: Reported on 10/30/2021) 350 g 0   fluticasone (CUTIVATE) 0.05 % cream Apply topically 2 (two) times daily. For one week then daily, gradually taper use (Patient taking differently: Apply 1 application  topically daily as needed (eczema).) 30 g 0   HYDROcodone-acetaminophen (NORCO/VICODIN) 5-325 MG tablet Take 1 tablet by mouth every 4 (four) hours as needed. 20 tablet 0  ketorolac (TORADOL) 10 MG tablet Take 1 tablet (10 mg total) by mouth every 8 (eight) hours as needed. 15 tablet 0   megestrol (MEGACE) 40 MG tablet Take 3 x 5 days then 2 x 5 days then 1 daily (Patient not taking: Reported on 04/01/2022) 45 tablet 3   megestrol (MEGACE) 40 MG tablet 3 tablets a day 90 tablet 3   metFORMIN (GLUCOPHAGE) 500 MG tablet Take 500 mg by mouth 2 (two) times daily.      montelukast (SINGULAIR) 10 MG tablet Take 10 mg by mouth daily.     Nystatin (GERHARDT'S BUTT CREAM) CREA Apply 1 Application topically 3 (three) times daily. 1 each 11   Relugolix-Estradiol-Norethind (MYFEMBREE) 40-1-0.5 MG TABS Take 1 tablet by mouth daily. (Patient not taking: Reported on 04/01/2022) 30 tablet 11   venlafaxine XR (EFFEXOR-XR) 75 MG 24 hr capsule  Take 2 capsules (150 mg total) by mouth daily with breakfast. (Patient taking differently: Take 75 mg by mouth daily with breakfast.) 60 capsule 12   No current facility-administered medications for this visit.    Allergies:   Dapagliflozin and Lisinopril    Social History:  The patient  reports that she quit smoking about 8 years ago. Her smoking use included cigarettes. She has a 3.75 pack-year smoking history. She has never used smokeless tobacco. She reports current alcohol use. She reports that she does not currently use drugs after having used the following drugs: Marijuana.   Family History:  The patient's ***family history includes CVA (age of onset: 51) in her maternal aunt; CVA (age of onset: 43) in her maternal grandmother; Cancer (age of onset: 63) in her mother; Diabetes in her father, maternal grandmother, and paternal grandmother; Heart disease (age of onset: 20) in her father; Heart failure in her father; Hypertension in her mother; Kidney failure in her sister.    ROS:  Please see the history of present illness.   Otherwise, review of systems are positive for {NONE DEFAULTED:18576}.   All other systems are reviewed and negative.    PHYSICAL EXAM: VS:  There were no vitals taken for this visit. , BMI There is no height or weight on file to calculate BMI. GEN: Well nourished, well developed, in no acute distress HEENT: normal Neck: no JVD, carotid bruits, or masses Cardiac: ***RRR; no murmurs, rubs, or gallops,no edema  Respiratory:  clear to auscultation bilaterally, normal work of breathing GI: soft, nontender, nondistended, + BS MS: no deformity or atrophy Skin: warm and dry, no rash Neuro:  Strength and sensation are intact Psych: euthymic mood, full affect   EKG:  EKG {ACTION; IS/IS VCB:[id number]} ordered today. The ekg ordered today demonstrates ***   Recent Labs: 06/20/2021: ALT 19 03/24/2022: BUN 12; Creatinine, Ser 0.98; Hemoglobin 12.7; Platelets 341;  Potassium 3.9; Sodium 138    Lipid Panel    Component Value Date/Time   CHOL 197 08/05/2016 0637   TRIG 217 (H) 08/05/2016 0637   HDL 36 (L) 08/05/2016 0637   CHOLHDL 5.5 08/05/2016 0637   VLDL 43 (H) 08/05/2016 0637   LDLCALC 118 (H) 08/05/2016 0637   Dated 01/11/22: cholesterol 104, triglycerides 78, HDL 30, LDL 58. A1c 8.2%. CMET normal.   Wt Readings from Last 3 Encounters:  04/01/22 (!) 305 lb (138.3 kg)  03/24/22 290 lb (131.5 kg)  10/30/21 (!) 306 lb (138.8 kg)      Other studies Reviewed: Additional studies/ records that were reviewed today include:   Echo 08/06/16: Study Conclusions   -  Left ventricle: The cavity size was normal. Wall thickness was    increased in a pattern of moderate LVH. Systolic function was    normal. The estimated ejection fraction was in the range of 60%    to 65%. Wall motion was normal; there were no regional wall    motion abnormalities.  - Mitral valve: There was trivial regurgitation.  - Right atrium: Central venous pressure (est): 3 mm Hg.  - Atrial septum: A patent foramen ovale cannot be excluded.  - Tricuspid valve: There was trivial regurgitation.  - Pulmonary arteries: PA peak pressure: 20 mm Hg (S).  - Pericardium, extracardiac: There was no pericardial effusion.   Impressions:   - Moderate LVH with LVEF 60-65%. Grossly normal diastolic function.    Trivial mitral and tricuspid regurgitation. Cannot exclude PFO,    consider agitated saline contrast study. PASP 20 mmHg.    ASSESSMENT AND PLAN:  1.  ***   Current medicines are reviewed at length with the patient today.  The patient {ACTIONS; HAS/DOES NOT HAVE:19233} concerns regarding medicines.  The following changes have been made:  {PLAN; NO CHANGE:13088:s}  Labs/ tests ordered today include: *** No orders of the defined types were placed in this encounter.        Disposition:   FU with *** in {gen number [id number]} {Days to years:10300}  Signed, Deonna Krummel  [place], MD  04/04/2022 1:21 PM    [facility] [address] [place], [place], [address] Phone [phone], Fax [phone]

## 2022-04-07 ENCOUNTER — Ambulatory Visit: Payer: 59 | Admitting: Cardiology

## 2022-04-08 ENCOUNTER — Ambulatory Visit (INDEPENDENT_AMBULATORY_CARE_PROVIDER_SITE_OTHER): Payer: 59 | Admitting: Cardiovascular Disease

## 2022-04-08 ENCOUNTER — Encounter: Payer: Self-pay | Admitting: Cardiovascular Disease

## 2022-04-08 VITALS — BP 138/94 | HR 91 | Ht 64.0 in | Wt 306.0 lb

## 2022-04-08 DIAGNOSIS — R072 Precordial pain: Secondary | ICD-10-CM | POA: Diagnosis not present

## 2022-04-08 DIAGNOSIS — Z01810 Encounter for preprocedural cardiovascular examination: Secondary | ICD-10-CM | POA: Diagnosis not present

## 2022-04-08 DIAGNOSIS — R1084 Generalized abdominal pain: Secondary | ICD-10-CM

## 2022-04-08 NOTE — Patient Instructions (Addendum)
Medication Instructions:  Your Physician recommend you continue on your current medication as directed.    *If you need a refill on your cardiac medications before your next appointment, please call your pharmacy*   Lab Work: None ordered today   Testing/Procedures: None ordered today   Follow-Up: At [facility], you and your health needs are our priority.  As part of our continuing mission to provide you with exceptional heart care, we have created designated Provider Care Teams.  These Care Teams include your primary Cardiologist (physician) and Advanced Practice Providers (APPs -  Physician Assistants and Nurse Practitioners) who all work together to provide you with the care you need, when you need it.  We recommend signing up for the patient portal called "MyChart".  Sign up information is provided on this After Visit Summary.  MyChart is used to connect with patients for Virtual Visits (Telemedicine).  Patients are able to view lab/test results, encounter notes, upcoming appointments, etc.  Non-urgent messages can be sent to your provider as well.   To learn more about what you can do with MyChart, go to [email or web address].    Your next appointment:   1 year(s)  The format for your next appointment:   In Person  Provider:   Evalina Field, MD {

## 2022-04-08 NOTE — Progress Notes (Signed)
Cardiology Office Note:   Date:  04/08/2022  NAME:  Joanna Newman    MRN: [id number] DOB:  January 30, 1971   PCP:  Alroy Dust, L.Marlou Sa, MD  Cardiologist:  Evalina Field, MD  Electrophysiologist:  None   Referring MD: Aurea Graff.Marlou Sa, MD   Chief Complaint  Patient presents with   Chest Pain    History of Present Illness:   Mahogani Holohan is a 51 y.o. female with a hx of DM, obesity, HTN who is being seen today for the evaluation of chest pain at the request of Alroy Dust, L.Marlou Sa, MD. Seen in ER 03/24/2022 for CP. Negative troponin.  She is currently suffering from uterine fibroids.  She will undergo hysterectomy with her gynecologist.  Apparently she was seen in the emergency room on 03/24/2022.  She reports she was seen for abdominal pain.  They confused this for chest pain.  She reports no chest pressure no chest tightness.  She can climb a flight of stairs without any issues.  She is limited by fibroids.  She has pain from this.  Her EKG from the emergency room was normal sinus rhythm with no acute ischemic changes.  Her EKG here is just demonstrating obesity artifact.  She does have high blood pressure and diabetes.  She is on insulin.  She has had a mini stroke.  No heart attack.  A1c 8.2.  LDL cholesterol is at goal.  She is on Lipitor.  She does have a family history of heart disease in her father.  He has congestive heart failure.  She has asthma.  Her BMI is 54.  Again it appears that her chest pain was confused.  She actually was seen for abdominal pain.  Cardiac enzymes were negative.  EKG unremarkable.  Problem List DM -A1c 8.2 2. HLD -T chol 104, HDL 30, LDL 58, TG 78 3. HTN 4. TIA  Past Medical History: Past Medical History:  Diagnosis Date   Anxiety    Arthritis    Asthma    Bilateral ovarian cysts    Depression    Diabetes mellitus without complication ([place])    Epidermoid cyst of finger    tip of left little finger   Fibroids    GERD (gastroesophageal  reflux disease)    Hyperlipidemia    Hypertension    Insomnia    LVH (left ventricular hypertrophy)    Moderate   Obesity    Pain of right heel    Sleep apnea    History of no problems since weight loss   Stroke ([place]) 07/2016   No residual   Substance abuse ([place])    cocaine, marijuana and alcohol abuse. quit end of 2012    Past Surgical History: Past Surgical History:  Procedure Laterality Date   CHOLECYSTECTOMY     COLONOSCOPY WITH PROPOFOL N/A 08/31/2021   Procedure: COLONOSCOPY WITH PROPOFOL;  Surgeon: Eloise Harman, DO;  Location: [facility];  Service: Endoscopy;  Laterality: N/A;  10:00am   HEEL SPUR SURGERY Bilateral    KNEE SURGERY     LAPAROTOMY WITH STAGING N/A 02/14/2018   Procedure: MINI-LAPAROTOMY;  Surgeon: Everitt Amber, MD;  Location: [facility];  Service: Gynecology;  Laterality: N/A;   LYSIS OF ADHESION  02/14/2018   Procedure: LYSIS OF ADHESION;  Surgeon: Everitt Amber, MD;  Location: [facility];  Service: Gynecology;;   POLYPECTOMY  08/31/2021   Procedure: POLYPECTOMY;  Surgeon: Eloise Harman, DO;  Location: [facility];  Service: Endoscopy;;  ROBOTIC ASSISTED TOTAL HYSTERECTOMY WITH BILATERAL SALPINGO OOPHERECTOMY N/A 02/14/2018   Procedure: XI ROBOTIC ASSISTED RIGHT SALPINGO OOPHORECTOMY, OMENTECTOMY;  Surgeon: Everitt Amber, MD;  Location: [facility];  Service: Gynecology;  Laterality: N/A;   TUBAL LIGATION      Current Medications: Current Meds  Medication Sig   albuterol (VENTOLIN HFA) 108 (90 Base) MCG/ACT inhaler Inhale 2 puffs into the lungs every 4 (four) hours as needed for wheezing or shortness of breath.   amLODipine (NORVASC) 2.5 MG tablet Take 1 tablet (2.5 mg total) by mouth daily.   aspirin EC 325 MG EC tablet Take 1 tablet (325 mg total) by mouth daily.   atorvastatin (LIPITOR) 20 MG tablet Take 1 tablet (20 mg total) by mouth daily at 6 PM.   clobetasol cream (TEMOVATE) 7.25 % Apply 1 application topically daily as needed (eczema).    diclofenac Sodium (VOLTAREN) 1 % GEL Apply 2 g topically 4 (four) times daily.   fluticasone (CUTIVATE) 0.05 % cream Apply topically 2 (two) times daily. For one week then daily, gradually taper use (Patient taking differently: Apply 1 application  topically daily as needed (eczema).)   HYDROcodone-acetaminophen (NORCO/VICODIN) 5-325 MG tablet Take 1 tablet by mouth every 4 (four) hours as needed.   ketorolac (TORADOL) 10 MG tablet Take 1 tablet (10 mg total) by mouth every 8 (eight) hours as needed.   megestrol (MEGACE) 40 MG tablet 3 tablets a day   metFORMIN (GLUCOPHAGE) 500 MG tablet Take 500 mg by mouth 2 (two) times daily.    montelukast (SINGULAIR) 10 MG tablet Take 10 mg by mouth daily.   Nystatin (GERHARDT'S BUTT CREAM) CREA Apply 1 Application topically 3 (three) times daily.   venlafaxine XR (EFFEXOR-XR) 75 MG 24 hr capsule Take 2 capsules (150 mg total) by mouth daily with breakfast. (Patient taking differently: Take 75 mg by mouth daily with breakfast.)     Allergies:    Dapagliflozin and Lisinopril   Social History: Social History   Socioeconomic History   Marital status: Married    Spouse name: Not on file   Number of children: 3   Years of education: Not on file   Highest education level: Not on file  Occupational History   Occupation: event specialist  Tobacco Use   Smoking status: Former    Packs/day: 0.25    Years: 15.00    Total pack years: 3.75    Types: Cigarettes    Quit date: 03/11/2014    Years since quitting: 8.0   Smokeless tobacco: Never  Vaping Use   Vaping Use: Never used  Substance and Sexual Activity   Alcohol use: Yes    Comment: occ; more in past   Drug use: Not Currently    Types: Marijuana    Comment: Last use 08/31/21 history of substance abuse; last marijuana use about 1 week ago   Sexual activity: Not Currently    Birth control/protection: Surgical, Abstinence    Comment: tubal  Other Topics Concern   Not on file  Social History  Narrative   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   Social Determinants of Health   Financial Resource Strain: [place]  (05/02/2020)   Overall Financial Resource Strain (CARDIA)    Difficulty of Paying Living Expenses: Not very hard  Food Insecurity: Food Insecurity Present (05/02/2020)   Hunger Vital Sign    Worried About Running Out of Food in the Last Year: Often true    Ran Out of Food  in the Last Year: Never true  Transportation Needs: No Transportation Needs (05/02/2020)   PRAPARE - Hydrologist (Medical): No    Lack of Transportation (Non-Medical): No  Physical Activity: Insufficiently Active (05/02/2020)   Exercise Vital Sign    Days of Exercise per Week: 3 days    Minutes of Exercise per Session: 30 min  Stress: Stress Concern Present (05/02/2020)   [place]    Feeling of Stress : To some extent  Social Connections: Socially Integrated (05/02/2020)   Social Connection and Isolation Panel [NHANES]    Frequency of Communication with Friends and Family: More than three times a week    Frequency of Social Gatherings with Friends and Family: Once a week    Attends Religious Services: 1 to 4 times per year    Active Member of [employer] or Organizations: Yes    Attends Music therapist: 1 to 4 times per year    Marital Status: Married     Family History: The patient's family history includes CVA (age of onset: 56) in her maternal aunt; CVA (age of onset: 43) in her maternal grandmother; Cancer (age of onset: 64) in her mother; Diabetes in her father, maternal grandmother, and paternal grandmother; Heart disease (age of onset: 23) in her father; Heart failure in her father; Hypertension in her mother; Kidney failure in her sister.  ROS:   All other ROS reviewed and negative. Pertinent positives noted in the HPI.     EKGs/Labs/Other Studies Reviewed:    The following studies were personally reviewed by me today:  EKG:  EKG is ordered today.  The ekg ordered today demonstrates normal sinus rhythm heart rate 91, poor R wave progression, possible inferior infarct, and was personally reviewed by me.   Recent Labs: 06/20/2021: ALT 19 03/24/2022: BUN 12; Creatinine, Ser 0.98; Hemoglobin 12.7; Platelets 341; Potassium 3.9; Sodium 138   Recent Lipid Panel    Component Value Date/Time   CHOL 197 08/05/2016 0637   TRIG 217 (H) 08/05/2016 0637   HDL 36 (L) 08/05/2016 0637   CHOLHDL 5.5 08/05/2016 0637   VLDL 43 (H) 08/05/2016 0637   LDLCALC 118 (H) 08/05/2016 3419    Physical Exam:   VS:  BP (!) 138/94   Pulse 91   Ht '5\' 4"'$  (1.626 m)   Wt (!) 306 lb (138.8 kg)   SpO2 98%   BMI 52.52 kg/m    Wt Readings from Last 3 Encounters:  04/08/22 (!) 306 lb (138.8 kg)  04/01/22 (!) 305 lb (138.3 kg)  03/24/22 290 lb (131.5 kg)    General: Well nourished, well developed, in no acute distress Head: Atraumatic, normal size  Eyes: PEERLA, EOMI  Neck: Supple, no JVD Endocrine: No thryomegaly Cardiac: Normal S1, S2; RRR; no murmurs, rubs, or gallops Lungs: Clear to auscultation bilaterally, no wheezing, rhonchi or rales  Abd: Soft, nontender, no hepatomegaly  Ext: No edema, pulses 2+ Musculoskeletal: No deformities, BUE and BLE strength normal and equal Skin: Warm and dry, no rashes   Neuro: Alert and oriented to person, place, time, and situation, CNII-XII grossly intact, no focal deficits  Psych: Normal mood and affect   ASSESSMENT:   Marigrace Mccole is a 51 y.o. female who presents for the following: 1. Precordial pain   2. Generalized abdominal pain   3. Preop cardiovascular exam     PLAN:   1. Precordial pain 2. Generalized  abdominal pain -Seen in the emergency room on 03/24/2022.  She actually was seen there for abdominal pain and uterine fibroid pain.  She did not have chest pain.  She reports no chest tightness.  No chest  discomfort.  She can complete greater than 4 METS without any issues.  Her EKG in the hospital showed normal sinus rhythm with no acute changes.  Her EKG today likely has incorrect lead placement.  This is normal.  She did not have an inferior infarct.  Echocardiogram several years ago was normal.  Given her lack of chest pain symptoms she does not need further testing.  She may proceed to surgery at acceptable risk.  3. Preop cardiovascular exam -Preoperative Risk Assessment - The Revised Cardiac Risk Index = 1 (CVA).  This equates to a low risk (0.9%) of major perioperative cardiovascular complication.  She denies any chest pain symptoms.  She can complete greater than 4 METS without any issues.  She may proceed to surgery at acceptable risk. - Our service is available as needed in the peri-operative period.         Disposition: Return if symptoms worsen or fail to improve.  Medication Adjustments/Labs and Tests Ordered: Current medicines are reviewed at length with the patient today.  Concerns regarding medicines are outlined above.  Orders Placed This Encounter  Procedures   EKG 12-Lead   No orders of the defined types were placed in this encounter.   Patient Instructions  Medication Instructions:  Your Physician recommend you continue on your current medication as directed.    *If you need a refill on your cardiac medications before your next appointment, please call your pharmacy*   Lab Work: None ordered today   Testing/Procedures: None ordered today   Follow-Up: At [facility], you and your health needs are our priority.  As part of our continuing mission to provide you with exceptional heart care, we have created designated Provider Care Teams.  These Care Teams include your primary Cardiologist (physician) and Advanced Practice Providers (APPs -  Physician Assistants and Nurse Practitioners) who all work together to provide you with the care you need, when you need  it.  We recommend signing up for the patient portal called "MyChart".  Sign up information is provided on this After Visit Summary.  MyChart is used to connect with patients for Virtual Visits (Telemedicine).  Patients are able to view lab/test results, encounter notes, upcoming appointments, etc.  Non-urgent messages can be sent to your provider as well.   To learn more about what you can do with MyChart, go to [email or web address].    Your next appointment:   1 year(s)  The format for your next appointment:   In Person  Provider:   Evalina Field, MD {           Signed, Addison Naegeli. Audie Box, MD, [place]  [address], [place], [place] [address] [phone]  04/08/2022 9:25 AM

## 2022-04-11 ENCOUNTER — Encounter: Payer: Self-pay | Admitting: Obstetrics & Gynecology

## 2022-04-14 ENCOUNTER — Encounter: Payer: Self-pay | Admitting: Obstetrics & Gynecology

## 2022-04-14 ENCOUNTER — Ambulatory Visit
Admission: EM | Admit: 2022-04-14 | Discharge: 2022-04-14 | Disposition: A | Discharge status: Home / Self Care | Payer: 59

## 2022-04-14 DIAGNOSIS — J069 Acute upper respiratory infection, unspecified: Secondary | ICD-10-CM | POA: Diagnosis not present

## 2022-04-14 MED ORDER — PROMETHAZINE-DM 6.25-15 MG/5ML PO SYRP
5.0000 mL | ORAL_SOLUTION | Freq: Four times a day (QID) | ORAL | 0 refills | Status: DC | PRN
Start: 2022-04-14 — End: 2022-10-12

## 2022-04-14 MED ORDER — BENZONATATE 100 MG PO CAPS: 100.0000 mg | ORAL_CAPSULE | Freq: Three times a day (TID) | ORAL | 0 refills | Status: DC | PRN

## 2022-04-14 NOTE — ED Triage Notes (Signed)
Pt reports ears ringing, cough "like bronchitis", nasal congestion, headache, sore throat, body aches x 5 days. Mucinex gives no relief.

## 2022-04-14 NOTE — Discharge Instructions (Signed)
Your symptoms and exam findings are most consistent with a viral upper respiratory infection. These usually run their course in about 10 days.  If your symptoms last longer than 10 days without improvement, please follow up with your primary care provider.  If your symptoms, worsen, please go to the Emergency Room.    Some things that can make you feel better are: - Increased rest - Increasing fluid with water/sugar free electrolytes - Acetaminophen and ibuprofen as needed for fever/pain.  - Salt water gargling, chloraseptic spray and throat lozenges - OTC guaifenesin (Mucinex).  - Saline sinus flushes or a neti pot.  - Humidifying the air. - Alternating cough suppressants as needed; do not take the Tessalon with the cough syrup, space them out by about 1 hour

## 2022-04-14 NOTE — ED Provider Notes (Signed)
[facility]    CSN: [id number] Arrival date & time: 04/14/22  1228      History   Chief Complaint Chief Complaint  Patient presents with   Cough   Nasal Congestion   Generalized Body Aches         HPI Joanna Newman is a 51 y.o. female.   Patient presents today with 5 days of body aches, dry cough, pressure behind ears, sore throat, nasal congestion, runny nose, postnasal drainage, headache, decreased appetite, and fatigue.  She also has a little bit of wheezing that is worse at nighttime.  She denies chest pain or tightness, sneezing, swollen glands, tooth pain, ear pain or drainage, abdominal pain, nausea/vomiting, diarrhea, and new rash on her skin.  She has been taking Mucinex without much relief.  Medical history significant for hypertension, morbid obesity, asthma.  Reports she has been taking albuterol and does not seem to be helping her symptoms much.    Past Medical History:  Diagnosis Date   Anxiety    Arthritis    Asthma    Bilateral ovarian cysts    Depression    Diabetes mellitus without complication ([place])    Epidermoid cyst of finger    tip of left little finger   Fibroids    GERD (gastroesophageal reflux disease)    Hyperlipidemia    Hypertension    Insomnia    LVH (left ventricular hypertrophy)    Moderate   Obesity    Pain of right heel    Sleep apnea    History of no problems since weight loss   Stroke ([place]) 07/2016   No residual   Substance abuse ([place])    cocaine, marijuana and alcohol abuse. quit end of 2012    Patient Active Problem List   Diagnosis Date Noted   Irregular periods 09/23/2021   Perimenopause 09/23/2021   Routine cervical smear 09/23/2021   [place] 09/23/2021   Hot flashes 09/23/2021   Menstrual cramps 09/23/2021   History of stroke 09/23/2021   Pelvic mass in female 02/14/2018   Ovarian torsion 12/31/2017   Ovarian cyst 12/31/2017   Impingement of right ankle joint 04/05/2017   Acute CVA  (cerebrovascular accident) ([place]) 08/06/2016   Paresthesia 08/04/2016   Hyponatremia 08/04/2016   Essential hypertension 12/25/2014   Insomnia 12/25/2014   Asthma, chronic 12/25/2014   Morbid obesity ([place]) 12/25/2014   Bilateral ovarian cysts 12/16/2014   Diabetes ([place]) 12/16/2014   Morbid obesity with BMI of 50.0-59.9, adult ([place]) 05/20/2012   Vaginal itching 05/20/2012    Past Surgical History:  Procedure Laterality Date   CHOLECYSTECTOMY     COLONOSCOPY WITH PROPOFOL N/A 08/31/2021   Procedure: COLONOSCOPY WITH PROPOFOL;  Surgeon: Eloise Harman, DO;  Location: [facility];  Service: Endoscopy;  Laterality: N/A;  10:00am   HEEL SPUR SURGERY Bilateral    KNEE SURGERY     LAPAROTOMY WITH STAGING N/A 02/14/2018   Procedure: MINI-LAPAROTOMY;  Surgeon: Everitt Amber, MD;  Location: [facility];  Service: Gynecology;  Laterality: N/A;   LYSIS OF ADHESION  02/14/2018   Procedure: LYSIS OF ADHESION;  Surgeon: Everitt Amber, MD;  Location: [facility];  Service: Gynecology;;   POLYPECTOMY  08/31/2021   Procedure: POLYPECTOMY;  Surgeon: Eloise Harman, DO;  Location: [facility];  Service: Endoscopy;;   ROBOTIC ASSISTED TOTAL HYSTERECTOMY WITH BILATERAL SALPINGO OOPHERECTOMY N/A 02/14/2018   Procedure: XI ROBOTIC ASSISTED RIGHT SALPINGO OOPHORECTOMY, OMENTECTOMY;  Surgeon: Everitt Amber, MD;  Location:  [facility];  Service: Gynecology;  Laterality: N/A;   TUBAL LIGATION      OB History     Gravida  5   Para  3   Term  3   Preterm      AB  2   Living  3      SAB      IAB  2   Ectopic      Multiple      Live Births               Home Medications    Prior to Admission medications   Medication Sig Start Date End Date Taking? Authorizing Provider  benzonatate (TESSALON) 100 MG capsule Take 1 capsule (100 mg total) by mouth 3 (three) times daily as needed for cough. Do not take with alcohol or while driving or operating heavy machinery 04/14/22  Yes Eulogio Bear, NP   dextromethorphan-guaiFENesin (MUCINEX DM) 30-600 MG 12hr tablet Take 1 tablet by mouth 2 (two) times daily.   Yes [provider]  promethazine-dextromethorphan (PROMETHAZINE-DM) 6.25-15 MG/5ML syrup Take 5 mLs by mouth 4 (four) times daily as needed for cough. Do not take with alcohol or while driving operating heavy machinery 04/14/22  Yes Noemi Chapel A, NP  albuterol (VENTOLIN HFA) 108 (90 Base) MCG/ACT inhaler Inhale 2 puffs into the lungs every 4 (four) hours as needed for wheezing or shortness of breath. 06/27/20   [provider]  amLODipine (NORVASC) 2.5 MG tablet Take 1 tablet (2.5 mg total) by mouth daily. 08/06/16   Verlee Monte, MD  aspirin EC 325 MG EC tablet Take 1 tablet (325 mg total) by mouth daily. 08/06/16   Verlee Monte, MD  atorvastatin (LIPITOR) 20 MG tablet Take 1 tablet (20 mg total) by mouth daily at 6 PM. 08/06/16   Verlee Monte, MD  clobetasol cream (TEMOVATE) 3.81 % Apply 1 application topically daily as needed (eczema).    [provider]  diclofenac Sodium (VOLTAREN) 1 % GEL Apply 2 g topically 4 (four) times daily. 01/26/21   Rodriguez-Southworth, Sunday Spillers, PA-C  fluticasone (CUTIVATE) 0.05 % cream Apply topically 2 (two) times daily. For one week then daily, gradually taper use Patient taking differently: Apply 1 application  topically daily as needed (eczema). 01/26/21   Estill Dooms, NP  HYDROcodone-acetaminophen (NORCO/VICODIN) 5-325 MG tablet Take 1 tablet by mouth every 4 (four) hours as needed. 03/24/22   Evalee Jefferson, PA-C  ketorolac (TORADOL) 10 MG tablet Take 1 tablet (10 mg total) by mouth every 8 (eight) hours as needed. 12/30/21   Florian Buff, MD  megestrol (MEGACE) 40 MG tablet 3 tablets a day 12/08/21   Florian Buff, MD  metFORMIN (GLUCOPHAGE) 500 MG tablet Take 500 mg by mouth 2 (two) times daily.     [provider]  montelukast (SINGULAIR) 10 MG tablet Take 10 mg by mouth daily. 03/01/20   [provider]  Nystatin (GERHARDT'S BUTT CREAM) CREA Apply 1 Application topically 3 (three) times daily. 04/01/22   Florian Buff, MD  venlafaxine XR (EFFEXOR-XR) 75 MG 24 hr capsule Take 2 capsules (150 mg total) by mouth daily with breakfast. Patient taking differently: Take 75 mg by mouth daily with breakfast. 04/26/19   Emily Filbert, MD    Family History Family History  Problem Relation Age of Onset   Diabetes Paternal Grandmother    Diabetes Maternal Grandmother    CVA Maternal Grandmother 50   Heart disease  Father 51       heart failure   Diabetes Father    Heart failure Father    Hypertension Mother    Cancer Mother 45       colon   Kidney failure Sister    CVA Maternal Aunt 85       Aneurysm    Social History Social History   Tobacco Use   Smoking status: Former    Packs/day: 0.25    Years: 15.00    Total pack years: 3.75    Types: Cigarettes    Quit date: 03/11/2014    Years since quitting: 8.0   Smokeless tobacco: Never  Vaping Use   Vaping Use: Never used  Substance Use Topics   Alcohol use: Yes    Comment: occ; more in past   Drug use: Not Currently    Types: Marijuana    Comment: Last use 08/31/21 history of substance abuse; last marijuana use about 1 week ago     Allergies   Dapagliflozin and Lisinopril   Review of Systems Review of Systems Per HPI  Physical Exam Triage Vital Signs ED Triage Vitals  Enc Vitals Group     BP 04/14/22 1255 (!) 165/121     Pulse Rate 04/14/22 1255 98     Resp 04/14/22 1255 20     Temp 04/14/22 1255 98.8 F (37.1 C)     Temp Source 04/14/22 1255 Oral     SpO2 04/14/22 1255 95 %     Weight --      Height --      Head Circumference --      Peak Flow --      Pain Score 04/14/22 1253 5     Pain Loc --      Pain Edu? --      Excl. in [place]? --    No data found.  Updated Vital Signs BP (!) 165/121 (BP Location: Right Wrist)   Pulse 98   Temp 98.8 F (37.1 C) (Oral)   Resp 20   SpO2 95%   Visual  Acuity Right Eye Distance:   Left Eye Distance:   Bilateral Distance:    Right Eye Near:   Left Eye Near:    Bilateral Near:     Physical Exam Vitals and nursing note reviewed.  Constitutional:      General: She is not in acute distress.    Appearance: Normal appearance. She is obese. She is not ill-appearing or toxic-appearing.  HENT:     Head: Normocephalic and atraumatic.     Right Ear: Tympanic membrane, ear canal and external ear normal.     Left Ear: Tympanic membrane, ear canal and external ear normal.     Nose: Congestion and rhinorrhea present.     Mouth/Throat:     Mouth: Mucous membranes are moist.     Pharynx: Oropharynx is clear. No oropharyngeal exudate or posterior oropharyngeal erythema.  Eyes:     General: No scleral icterus.    Extraocular Movements: Extraocular movements intact.  Cardiovascular:     Rate and Rhythm: Normal rate and regular rhythm.     Heart sounds: Normal heart sounds. No murmur heard. Pulmonary:     Effort: Pulmonary effort is normal. No respiratory distress.     Breath sounds: Normal breath sounds. No wheezing, rhonchi or rales.  Musculoskeletal:     Cervical back: Normal range of motion and neck supple.  Lymphadenopathy:     Cervical: No cervical  adenopathy.  Skin:    General: Skin is warm and dry.     Coloration: Skin is not jaundiced or pale.     Findings: No erythema or rash.  Neurological:     Mental Status: She is alert and oriented to person, place, and time.  Psychiatric:        Behavior: Behavior is cooperative.      UC Treatments / Results  Labs (all labs ordered are listed, but only abnormal results are displayed) Labs Reviewed - No data to display  EKG   Radiology No results found.  Procedures Procedures (including critical care time)  Medications Ordered in UC Medications - No data to display  Initial Impression / Assessment and Plan / UC Course  I have reviewed the triage vital signs and the nursing  notes.  Pertinent labs & imaging results that were available during my care of the patient were reviewed by me and considered in my medical decision making (see chart for details).    Patient is a pleasant 51 year old female presenting with viral upper respiratory symptoms.  Reassured patient that symptoms and exam findings are most consistent with a viral upper respiratory infection and explained lack of efficacy of antibiotics against viruses.  Discussed expected course and features suggestive of secondary bacterial infection.  Continue supportive care. Increase fluid intake with water or electrolyte solution like pedialyte. Encouraged acetaminophen as needed for fever/pain. Encouraged salt water gargling, chloraseptic spray and throat lozenges. Encouraged OTC guaifenesin. Encouraged saline sinus flushes and/or neti with humidified air.  Start cough suppressants alternating as needed to suppress dry cough.  Note given for work.  Encouraged to seek care if symptoms persist more than 7 days without improvement.  ER precautions also discussed.    Blood pressure is significantly elevated today in urgent care; patient reports this is "a good blood pressure" for her, she has an appointment upcoming with her primary care provider to possibly uptitrate her amlodipine.  She does not have symptoms of elevated blood pressure today.  I encouraged her to go the ER if she develops significant chest pain, shortness of breath, vision changes, dizziness, lightheadedness, swelling in her legs, or other symptoms of a stroke.The patient was given the opportunity to ask questions.  All questions answered to their satisfaction.  The patient is in agreement to this plan.   Final Clinical Impressions(s) / UC Diagnoses   Final diagnoses:  Viral URI with cough     Discharge Instructions      Your symptoms and exam findings are most consistent with a viral upper respiratory infection. These usually run their course in  about 10 days.  If your symptoms last longer than 10 days without improvement, please follow up with your primary care provider.  If your symptoms, worsen, please go to the Emergency Room.    Some things that can make you feel better are: - Increased rest - Increasing fluid with water/sugar free electrolytes - Acetaminophen and ibuprofen as needed for fever/pain.  - Salt water gargling, chloraseptic spray and throat lozenges - OTC guaifenesin (Mucinex).  - Saline sinus flushes or a neti pot.  - Humidifying the air. - Alternating cough suppressants as needed; do not take the Tessalon with the cough syrup, space them out by about 1 hour     ED Prescriptions     Medication Sig Dispense Auth. Provider   promethazine-dextromethorphan (PROMETHAZINE-DM) 6.25-15 MG/5ML syrup Take 5 mLs by mouth 4 (four) times daily as needed for cough. Do  not take with alcohol or while driving operating heavy machinery 118 mL Noemi Chapel A, NP   benzonatate (TESSALON) 100 MG capsule Take 1 capsule (100 mg total) by mouth 3 (three) times daily as needed for cough. Do not take with alcohol or while driving or operating heavy machinery 21 capsule Eulogio Bear, NP      PDMP not reviewed this encounter.   Eulogio Bear, NP 04/14/22 [phone]

## 2022-04-19 ENCOUNTER — Ambulatory Visit: Payer: 59 | Admitting: Cardiology

## 2022-04-22 DIAGNOSIS — Z029 Encounter for administrative examinations, unspecified: Secondary | ICD-10-CM

## 2022-05-04 NOTE — Patient Instructions (Signed)
Joanna Newman  05/04/2022     '@PREFPERIOPPHARMACY'$ @   Your procedure is scheduled on  05/11/2022.   Report to [facility] at  0600  A.M.   Call this number if you have problems the morning of surgery:  Joanna Newman  05/04/2022     '@PREFPERIOPPHARMACY'$ @   Your procedure is scheduled on 05/11/2022.   Report to [facility] at  0600 A.M.   Call this number if you have problems the morning of surgery:  [phone]   Remember:  Do not eat after midnight.    You may drink clear liquids until 0330 am on 05/11/2022.    Clear liquids allowed are:                    Water, Juice (No red color; non-citric and without pulp; diabetics please choose diet or no sugar options), Carbonated beverages (diabetics please choose diet or no sugar options), Clear Tea (No creamer, milk, or cream, including half & half and powdered creamer), Black Coffee Only (No creamer, milk or cream, including half & half and powdered creamer), Plain Jell-O Only (No red color; diabetics please choose no sugar options), Clear Sports drink (No red color; diabetics please choose diet or no sugar options), and Plain Popsicles Only (No red color; diabetics please choose no sugar options)      At 0330 am on 05/11/2022 drink your carb drink. You can have nothing else to drink after this.     Use your inhalers before your come and bring your rescue inhaler with you.     DO NOT take any medications for diabetes the morning of your procedure.    Take these medicines the morning of surgery with A SIP OF WATER            norvasc, norco (if needed), effexor.     Do not wear jewelry, make-up or nail polish.  Do not wear lotions, powders, or perfumes, or deodorant.  Do not shave 48 hours prior to surgery.  Men may shave face and neck.  Do not bring valuables to the hospital.  [facility] is not responsible for any belongings or valuables.  Contacts, dentures or bridgework  may not be worn into surgery.  Leave your suitcase in the car.  After surgery it may be brought to your room.  For patients admitted to the hospital, discharge time will be determined by your treatment team.  Patients discharged the day of surgery will not be allowed to drive home and must have someone with them for 24 hours.    Special instructions:   DO NOT smoke tobacco or vape for 24 hours before your procedure.  Please read over the following fact sheets that you were given. Pain Booklet, Coughing and Deep Breathing, Blood Transfusion Information, Surgical Site Infection Prevention, Anesthesia Post-op Instructions, and Care and Recovery After Surgery            Abdominal Hysterectomy, Care After The following information offers guidance on how to care for yourself after your procedure. Your doctor may also give you more specific instructions. If you have problems or questions, contact your doctor. What can I expect after the procedure? After the procedure, it is common to have: Pain. Tiredness. No desire to eat. Less interest in sex. Bleeding and fluid (discharge) from your vagina. You may need to use a pad after this procedure. Trouble pooping (  constipation). Feelings of sadness or other emotions. Follow these instructions at home: Medicines Take over-the-counter and prescription medicines only as told by your doctor. If you were prescribed an antibiotic medicine, take it as told by your doctor. Do not stop using the antibiotic even if you start to feel better. If told, take steps to prevent problems with pooping (constipation). You may need to: Drink enough fluid to keep your pee (urine) pale yellow. Take medicines. You will be told what medicines to take. Eat foods that are high in fiber. These include beans, whole grains, and fresh fruits and vegetables. Limit foods that are high in fat and sugar. These include fried or sweet foods. Ask your doctor if you should avoid  driving or using machines while you are taking your medicine. Surgical cut care  Follow instructions from your doctor about how to take care of your cut from surgery (incision). Make sure you: Wash your hands with soap and water for at least 20 seconds before and after you change your bandage. If you cannot use soap and water, use hand sanitizer. Change your bandage. Leave stitches or skin glue in place for at least two weeks. Leave tape strips alone unless you are told to take them off. You may trim the edges of the tape strips if they curl up. Keep the bandage dry until your doctor says it can be taken off. Check your incision every day for signs of infection. Check for: More redness, swelling, or pain. Fluid or blood. Warmth. Pus or a bad smell. Activity  Rest as told by your doctor. Get up to take short walks every 1 to 2 hours. Ask for help if you feel weak or unsteady. Do not lift anything that is heavier than 10 lb (4.5 kg), or the limit that you are told. Follow your doctor's advice about exercise, driving, and general activities. Return to your normal activities when your doctor says that it is safe. Lifestyle Do not douche, use tampons, or have sex for at least 6 weeks or as told by your doctor. Do not drink alcohol until your doctor says it is okay. Do not smoke or use any products that contain nicotine or tobacco. These can delay healing after surgery. If you need help quitting, ask your doctor. General instructions Do not take baths, swim, or use a hot tub. Ask your doctor about taking showers or sponge baths. Try to have a responsible adult at home with you for the first 1-2 weeks to help with your daily chores. Wear tight-fitting (compression) stockings as told by your doctor. Keep all follow-up visits. Contact a doctor if: You have chills or a fever. You have any of these signs of infection around your cut: More redness, swelling, or pain. Fluid or  blood. Warmth. Pus or a bad smell. Your cut breaks open. You feel dizzy or light-headed. You have pain or bleeding when you pee. You keep having watery poop (diarrhea). You keep feeling like you may vomit or you keep vomiting. You have fluid coming from your vagina that is not normal. You have any type of reaction to your medicine that is not normal, like a rash, or you develop an allergy to your medicine. Your pain medicine does not help. Get help right away if: You have a fever and your symptoms get worse suddenly. You have very bad pain in your belly (abdomen). You are short of breath. You faint. You have pain, swelling, or redness of your leg. You bleed  a lot from your vagina and you see blood clots. Summary It is normal to have some pain, tiredness, and fluid that comes from your vagina. Do not take baths, swim, or use a hot tub. Ask your doctor about taking showers or sponge baths. Do not lift anything that is heavier than 10 lb (4.5 kg), or the limit that you are told. Follow your doctor's advice about exercise, driving, and general activities. Try to have a responsible adult at home with you for the first 1-2 weeks to help with your daily chores. This information is not intended to replace advice given to you by your health care provider. Make sure you discuss any questions you have with your health care provider. Document Revised: 12/05/2020 Document Reviewed: 05/29/2020 Elsevier Patient Education  [place] Anesthesia, Adult, Care After This sheet gives you information about how to care for yourself after your procedure. Your health care provider may also give you more specific instructions. If you have problems or questions, contact your health care provider. What can I expect after the procedure? After the procedure, the following side effects are common: Pain or discomfort at the IV site. Nausea. Vomiting. Sore throat. Trouble concentrating. Feeling  cold or chills. Feeling weak or tired. Sleepiness and fatigue. Soreness and body aches. These side effects can affect parts of the body that were not involved in surgery. Follow these instructions at home: For the time period you were told by your health care provider:  Rest. Do not participate in activities where you could fall or become injured. Do not drive or use machinery. Do not drink alcohol. Do not take sleeping pills or medicines that cause drowsiness. Do not make important decisions or sign legal documents. Do not take care of children on your own. Eating and drinking Follow any instructions from your health care provider about eating or drinking restrictions. When you feel hungry, start by eating small amounts of foods that are soft and easy to digest (bland), such as toast. Gradually return to your regular diet. Drink enough fluid to keep your urine pale yellow. If you vomit, rehydrate by drinking water, juice, or clear broth. General instructions If you have sleep apnea, surgery and certain medicines can increase your risk for breathing problems. Follow instructions from your health care provider about wearing your sleep device: Anytime you are sleeping, including during daytime naps. While taking prescription pain medicines, sleeping medicines, or medicines that make you drowsy. Have a responsible adult stay with you for the time you are told. It is important to have someone help care for you until you are awake and alert. Return to your normal activities as told by your health care provider. Ask your health care provider what activities are safe for you. Take over-the-counter and prescription medicines only as told by your health care provider. If you smoke, do not smoke without supervision. Keep all follow-up visits as told by your health care provider. This is important. Contact a health care provider if: You have nausea or vomiting that does not get better with  medicine. You cannot eat or drink without vomiting. You have pain that does not get better with medicine. You are unable to pass urine. You develop a skin rash. You have a fever. You have redness around your IV site that gets worse. Get help right away if: You have difficulty breathing. You have chest pain. You have blood in your urine or stool, or you vomit blood. Summary After the procedure, it  is common to have a sore throat or nausea. It is also common to feel tired. Have a responsible adult stay with you for the time you are told. It is important to have someone help care for you until you are awake and alert. When you feel hungry, start by eating small amounts of foods that are soft and easy to digest (bland), such as toast. Gradually return to your regular diet. Drink enough fluid to keep your urine pale yellow. Return to your normal activities as told by your health care provider. Ask your health care provider what activities are safe for you. This information is not intended to replace advice given to you by your health care provider. Make sure you discuss any questions you have with your health care provider. Document Revised: 06/12/2020 Document Reviewed: 01/10/2020 Elsevier Patient Education  [place]. How to Use Chlorhexidine for Bathing Chlorhexidine gluconate (CHG) is a germ-killing (antiseptic) solution that is used to clean the skin. It can get rid of the bacteria that normally live on the skin and can keep them away for about 24 hours. To clean your skin with CHG, you may be given: A CHG solution to use in the shower or as part of a sponge bath. A prepackaged cloth that contains CHG. Cleaning your skin with CHG may help lower the risk for infection: While you are staying in the intensive care unit of the hospital. If you have a vascular access, such as a central line, to provide short-term or long-term access to your veins. If you have a catheter to drain urine  from your bladder. If you are on a ventilator. A ventilator is a machine that helps you breathe by moving air in and out of your lungs. After surgery. What are the risks? Risks of using CHG include: A skin reaction. Hearing loss, if CHG gets in your ears and you have a perforated eardrum. Eye injury, if CHG gets in your eyes and is not rinsed out. The CHG product catching fire. Make sure that you avoid smoking and flames after applying CHG to your skin. Do not use CHG: If you have a chlorhexidine allergy or have previously reacted to chlorhexidine. On babies younger than 10 months of age. How to use CHG solution Use CHG only as told by your health care provider, and follow the instructions on the label. Use the full amount of CHG as directed. Usually, this is one bottle. During a shower Follow these steps when using CHG solution during a shower (unless your health care provider gives you different instructions): Start the shower. Use your normal soap and shampoo to wash your face and hair. Turn off the shower or move out of the shower stream. Pour the CHG onto a clean washcloth. Do not use any type of brush or rough-edged sponge. Starting at your neck, lather your body down to your toes. Make sure you follow these instructions: If you will be having surgery, pay special attention to the part of your body where you will be having surgery. Scrub this area for at least 1 minute. Do not use CHG on your head or face. If the solution gets into your ears or eyes, rinse them well with water. Avoid your genital area. Avoid any areas of skin that have broken skin, cuts, or scrapes. Scrub your back and under your arms. Make sure to wash skin folds. Let the lather sit on your skin for 1-2 minutes or as long as told by your  health care provider. Thoroughly rinse your entire body in the shower. Make sure that all body creases and crevices are rinsed well. Dry off with a clean towel. Do not put any  substances on your body afterward--such as powder, lotion, or perfume--unless you are told to do so by your health care provider. Only use lotions that are recommended by the manufacturer. Put on clean clothes or pajamas. If it is the night before your surgery, sleep in clean sheets.  During a sponge bath Follow these steps when using CHG solution during a sponge bath (unless your health care provider gives you different instructions): Use your normal soap and shampoo to wash your face and hair. Pour the CHG onto a clean washcloth. Starting at your neck, lather your body down to your toes. Make sure you follow these instructions: If you will be having surgery, pay special attention to the part of your body where you will be having surgery. Scrub this area for at least 1 minute. Do not use CHG on your head or face. If the solution gets into your ears or eyes, rinse them well with water. Avoid your genital area. Avoid any areas of skin that have broken skin, cuts, or scrapes. Scrub your back and under your arms. Make sure to wash skin folds. Let the lather sit on your skin for 1-2 minutes or as long as told by your health care provider. Using a different clean, wet washcloth, thoroughly rinse your entire body. Make sure that all body creases and crevices are rinsed well. Dry off with a clean towel. Do not put any substances on your body afterward--such as powder, lotion, or perfume--unless you are told to do so by your health care provider. Only use lotions that are recommended by the manufacturer. Put on clean clothes or pajamas. If it is the night before your surgery, sleep in clean sheets. How to use CHG prepackaged cloths Only use CHG cloths as told by your health care provider, and follow the instructions on the label. Use the CHG cloth on clean, dry skin. Do not use the CHG cloth on your head or face unless your health care provider tells you to. When washing with the CHG cloth: Avoid  your genital area. Avoid any areas of skin that have broken skin, cuts, or scrapes. Before surgery Follow these steps when using a CHG cloth to clean before surgery (unless your health care provider gives you different instructions): Using the CHG cloth, vigorously scrub the part of your body where you will be having surgery. Scrub using a back-and-forth motion for 3 minutes. The area on your body should be completely wet with CHG when you are done scrubbing. Do not rinse. Discard the cloth and let the area air-dry. Do not put any substances on the area afterward, such as powder, lotion, or perfume. Put on clean clothes or pajamas. If it is the night before your surgery, sleep in clean sheets.  For general bathing Follow these steps when using CHG cloths for general bathing (unless your health care provider gives you different instructions). Use a separate CHG cloth for each area of your body. Make sure you wash between any folds of skin and between your fingers and toes. Wash your body in the following order, switching to a new cloth after each step: The front of your neck, shoulders, and chest. Both of your arms, under your arms, and your hands. Your stomach and groin area, avoiding the genitals. Your right leg and foot.  Your left leg and foot. The back of your neck, your back, and your buttocks. Do not rinse. Discard the cloth and let the area air-dry. Do not put any substances on your body afterward--such as powder, lotion, or perfume--unless you are told to do so by your health care provider. Only use lotions that are recommended by the manufacturer. Put on clean clothes or pajamas. Contact a health care provider if: Your skin gets irritated after scrubbing. You have questions about using your solution or cloth. You swallow any chlorhexidine. Call your local poison control center (1-918-007-5001 in the [place] Get help right away if: Your eyes itch badly, or they become very red or  swollen. Your skin itches badly and is red or swollen. Your hearing changes. You have trouble seeing. You have swelling or tingling in your mouth or throat. You have trouble breathing. These symptoms may represent a serious problem that is an emergency. Do not wait to see if the symptoms will go away. Get medical help right away. Call your local emergency services (911 in the [place] Do not drive yourself to the hospital. Summary Chlorhexidine gluconate (CHG) is a germ-killing (antiseptic) solution that is used to clean the skin. Cleaning your skin with CHG may help to lower your risk for infection. You may be given CHG to use for bathing. It may be in a bottle or in a prepackaged cloth to use on your skin. Carefully follow your health care provider's instructions and the instructions on the product label. Do not use CHG if you have a chlorhexidine allergy. Contact your health care provider if your skin gets irritated after scrubbing. This information is not intended to replace advice given to you by your health care provider. Make sure you discuss any questions you have with your health care provider. Document Revised: 12/08/2020 Document Reviewed: 12/08/2020 Elsevier Patient Education  [place].

## 2022-05-05 ENCOUNTER — Other Ambulatory Visit: Payer: Self-pay | Admitting: Obstetrics & Gynecology

## 2022-05-05 DIAGNOSIS — Z01818 Encounter for other preprocedural examination: Secondary | ICD-10-CM

## 2022-05-06 ENCOUNTER — Encounter (HOSPITAL_COMMUNITY)
Admission: RE | Admit: 2022-05-06 | Discharge: 2022-05-06 | Disposition: A | Discharge status: Home / Self Care | Payer: PRIVATE HEALTH INSURANCE | Source: Ambulatory Visit | Attending: Obstetrics & Gynecology | Admitting: Obstetrics & Gynecology

## 2022-05-06 ENCOUNTER — Encounter (HOSPITAL_COMMUNITY): Payer: Self-pay

## 2022-05-06 VITALS — BP 143/82 | HR 82 | Temp 98.7°F | Resp 18 | Ht 64.0 in | Wt 306.0 lb

## 2022-05-06 DIAGNOSIS — Z01812 Encounter for preprocedural laboratory examination: Secondary | ICD-10-CM | POA: Insufficient documentation

## 2022-05-06 DIAGNOSIS — E119 Type 2 diabetes mellitus without complications: Secondary | ICD-10-CM | POA: Diagnosis not present

## 2022-05-06 DIAGNOSIS — Z794 Long term (current) use of insulin: Secondary | ICD-10-CM | POA: Diagnosis not present

## 2022-05-06 DIAGNOSIS — Z01818 Encounter for other preprocedural examination: Secondary | ICD-10-CM

## 2022-05-06 LAB — COMPREHENSIVE METABOLIC PANEL
ALT: 15 U/L (ref 0–44)
AST: 14 U/L — ABNORMAL LOW (ref 15–41)
Albumin: 4.4 g/dL (ref 3.5–5.0)
Alkaline Phosphatase: 70 U/L (ref 38–126)
Anion gap: 7 (ref 5–15)
BUN: 9 mg/dL (ref 6–20)
CO2: 22 mmol/L (ref 22–32)
Calcium: 8.8 mg/dL — ABNORMAL LOW (ref 8.9–10.3)
Chloride: 109 mmol/L (ref 98–111)
Creatinine, Ser: 0.99 mg/dL (ref 0.44–1.00)
GFR, Estimated: >60 mL/min (ref >60)
Glucose, Bld: 102 mg/dL — ABNORMAL HIGH (ref 70–99)
Potassium: 3.9 mmol/L (ref 3.5–5.1)
Sodium: 138 mmol/L (ref 135–145)
Total Bilirubin: 0.7 mg/dL (ref 0.3–1.2)
Total Protein: 7.4 g/dL (ref 6.5–8.1)

## 2022-05-06 LAB — CBC WITH DIFFERENTIAL/PLATELET
Abs Immature Granulocytes: 0.02 10*3/uL (ref 0.00–0.07)
Basophils Absolute: 0.1 10*3/uL (ref 0.0–0.1)
Basophils Relative: 1 %
Eosinophils Absolute: 0.7 10*3/uL — ABNORMAL HIGH (ref 0.0–0.5)
Eosinophils Relative: 8 %
HCT: 39.5 % (ref 36.0–46.0)
Hemoglobin: 12.7 g/dL (ref 12.0–15.0)
Immature Granulocytes: 0 %
Lymphocytes Relative: 44 %
Lymphs Abs: 3.8 10*3/uL (ref 0.7–4.0)
MCH: 28.5 pg (ref 26.0–34.0)
MCHC: 32.2 g/dL (ref 30.0–36.0)
MCV: 88.6 fL (ref 80.0–100.0)
Monocytes Absolute: 0.4 10*3/uL (ref 0.1–1.0)
Monocytes Relative: 5 %
Neutro Abs: 3.7 10*3/uL (ref 1.7–7.7)
Neutrophils Relative %: 42 %
Platelets: 396 10*3/uL (ref 150–400)
RBC: 4.46 MIL/uL (ref 3.87–5.11)
RDW: 14.6 % (ref 11.5–15.5)
WBC: 8.8 10*3/uL (ref 4.0–10.5)
nRBC: 0 % (ref 0.0–0.2)

## 2022-05-06 LAB — RAPID HIV SCREEN (HIV 1/2 AB+AG)
HIV 1/2 Antibodies: NONREACTIVE
HIV-1 P24 Antigen - HIV24: NONREACTIVE

## 2022-05-06 LAB — TYPE AND SCREEN
ABO/RH(D): A POS
Antibody Screen: NEGATIVE

## 2022-05-06 LAB — URINALYSIS, ROUTINE W REFLEX MICROSCOPIC
Bilirubin Urine: NEGATIVE
Glucose, UA: NEGATIVE mg/dL
Ketones, ur: NEGATIVE mg/dL
Nitrite: NEGATIVE
Protein, ur: NEGATIVE mg/dL
RBC / HPF: >50 RBC/hpf — ABNORMAL HIGH (ref 0–5)
Specific Gravity, Urine: 1.008 (ref 1.005–1.030)
pH: 6 (ref 5.0–8.0)

## 2022-05-06 LAB — POCT PREGNANCY, URINE: Preg Test, Ur: NEGATIVE

## 2022-05-06 LAB — HEMOGLOBIN A1C
Hgb A1c MFr Bld: 7.5 % — ABNORMAL HIGH (ref 4.8–5.6)
Mean Plasma Glucose: 168.55 mg/dL

## 2022-05-11 ENCOUNTER — Ambulatory Visit (HOSPITAL_COMMUNITY): Payer: PRIVATE HEALTH INSURANCE | Admitting: Certified Registered"

## 2022-05-11 ENCOUNTER — Encounter (HOSPITAL_COMMUNITY): Payer: Self-pay | Admitting: Obstetrics & Gynecology

## 2022-05-11 ENCOUNTER — Inpatient Hospital Stay (HOSPITAL_COMMUNITY)
Admission: RE | Admit: 2022-05-11 | Discharge: 2022-05-12 | DRG: 742 | Disposition: A | Discharge status: Home / Self Care | Payer: PRIVATE HEALTH INSURANCE | Attending: Obstetrics & Gynecology | Admitting: Obstetrics & Gynecology

## 2022-05-11 ENCOUNTER — Other Ambulatory Visit: Payer: Self-pay

## 2022-05-11 ENCOUNTER — Encounter (HOSPITAL_COMMUNITY)
Admission: RE | Disposition: A | Discharge status: Home / Self Care | Payer: Self-pay | Source: Home / Self Care | Attending: Obstetrics & Gynecology

## 2022-05-11 DIAGNOSIS — Z87891 Personal history of nicotine dependence: Secondary | ICD-10-CM

## 2022-05-11 DIAGNOSIS — E119 Type 2 diabetes mellitus without complications: Secondary | ICD-10-CM

## 2022-05-11 DIAGNOSIS — R102 Pelvic and perineal pain: Secondary | ICD-10-CM | POA: Diagnosis present

## 2022-05-11 DIAGNOSIS — E785 Hyperlipidemia, unspecified: Secondary | ICD-10-CM | POA: Diagnosis present

## 2022-05-11 DIAGNOSIS — M1990 Unspecified osteoarthritis, unspecified site: Secondary | ICD-10-CM | POA: Diagnosis present

## 2022-05-11 DIAGNOSIS — Z780 Asymptomatic menopausal state: Secondary | ICD-10-CM

## 2022-05-11 DIAGNOSIS — D259 Leiomyoma of uterus, unspecified: Secondary | ICD-10-CM | POA: Diagnosis present

## 2022-05-11 DIAGNOSIS — Z9049 Acquired absence of other specified parts of digestive tract: Secondary | ICD-10-CM

## 2022-05-11 DIAGNOSIS — G4730 Sleep apnea, unspecified: Secondary | ICD-10-CM | POA: Diagnosis present

## 2022-05-11 DIAGNOSIS — Z7982 Long term (current) use of aspirin: Secondary | ICD-10-CM

## 2022-05-11 DIAGNOSIS — K219 Gastro-esophageal reflux disease without esophagitis: Secondary | ICD-10-CM | POA: Diagnosis present

## 2022-05-11 DIAGNOSIS — F32A Depression, unspecified: Secondary | ICD-10-CM | POA: Diagnosis present

## 2022-05-11 DIAGNOSIS — Z90721 Acquired absence of ovaries, unilateral: Secondary | ICD-10-CM

## 2022-05-11 DIAGNOSIS — G4700 Insomnia, unspecified: Secondary | ICD-10-CM | POA: Diagnosis present

## 2022-05-11 DIAGNOSIS — N946 Dysmenorrhea, unspecified: Secondary | ICD-10-CM | POA: Diagnosis present

## 2022-05-11 DIAGNOSIS — F419 Anxiety disorder, unspecified: Secondary | ICD-10-CM | POA: Diagnosis present

## 2022-05-11 DIAGNOSIS — I10 Essential (primary) hypertension: Secondary | ICD-10-CM | POA: Diagnosis present

## 2022-05-11 DIAGNOSIS — Z7984 Long term (current) use of oral hypoglycemic drugs: Secondary | ICD-10-CM | POA: Diagnosis not present

## 2022-05-11 DIAGNOSIS — Z8249 Family history of ischemic heart disease and other diseases of the circulatory system: Secondary | ICD-10-CM

## 2022-05-11 DIAGNOSIS — Z90710 Acquired absence of both cervix and uterus: Principal | ICD-10-CM | POA: Diagnosis present

## 2022-05-11 DIAGNOSIS — Z6841 Body Mass Index (BMI) 40.0 and over, adult: Secondary | ICD-10-CM

## 2022-05-11 DIAGNOSIS — N939 Abnormal uterine and vaginal bleeding, unspecified: Secondary | ICD-10-CM | POA: Diagnosis not present

## 2022-05-11 DIAGNOSIS — Z888 Allergy status to other drugs, medicaments and biological substances status: Secondary | ICD-10-CM

## 2022-05-11 DIAGNOSIS — Z9079 Acquired absence of other genital organ(s): Secondary | ICD-10-CM

## 2022-05-11 DIAGNOSIS — Z79899 Other long term (current) drug therapy: Secondary | ICD-10-CM

## 2022-05-11 DIAGNOSIS — Z833 Family history of diabetes mellitus: Secondary | ICD-10-CM | POA: Diagnosis not present

## 2022-05-11 DIAGNOSIS — Z7951 Long term (current) use of inhaled steroids: Secondary | ICD-10-CM

## 2022-05-11 DIAGNOSIS — N938 Other specified abnormal uterine and vaginal bleeding: Secondary | ICD-10-CM | POA: Diagnosis present

## 2022-05-11 HISTORY — PX: ABDOMINAL HYSTERECTOMY: SHX81

## 2022-05-11 HISTORY — PX: BILATERAL SALPINGECTOMY: SHX5743

## 2022-05-11 LAB — GLUCOSE, CAPILLARY
Glucose-Capillary: 141 mg/dL — ABNORMAL HIGH (ref 70–99)
Glucose-Capillary: 261 mg/dL — ABNORMAL HIGH (ref 70–99)

## 2022-05-11 LAB — HEMOGLOBIN AND HEMATOCRIT, BLOOD
HCT: 29.7 % — ABNORMAL LOW (ref 36.0–46.0)
Hemoglobin: 9.6 g/dL — ABNORMAL LOW (ref 12.0–15.0)

## 2022-05-11 SURGERY — HYSTERECTOMY, ABDOMINAL
Anesthesia: General | Site: Abdomen

## 2022-05-11 MED ORDER — HYDROMORPHONE HCL 1 MG/ML IJ SOLN
0.2000 mg | INTRAMUSCULAR | Status: DC | PRN
  Administered 2022-05-11: 0.5 mg via INTRAVENOUS
  Filled 2022-05-11 (×2): qty 0.5

## 2022-05-11 MED ORDER — FENTANYL CITRATE PF 50 MCG/ML IJ SOSY
25.0000 ug | PREFILLED_SYRINGE | INTRAMUSCULAR | Status: DC | PRN
  Administered 2022-05-11 (×3): 50 ug via INTRAVENOUS
  Filled 2022-05-11 (×3): qty 1

## 2022-05-11 MED ORDER — DOCUSATE SODIUM 100 MG PO CAPS
100.0000 mg | ORAL_CAPSULE | Freq: Two times a day (BID) | ORAL | Status: DC
  Administered 2022-05-11 – 2022-05-12 (×3): 100 mg via ORAL
  Filled 2022-05-11 (×3): qty 1

## 2022-05-11 MED ORDER — ORAL CARE MOUTH RINSE: 15.0000 mL | Freq: Once | OROMUCOSAL | Status: AC

## 2022-05-11 MED ORDER — DIPHENHYDRAMINE HCL 50 MG/ML IJ SOLN: 25.0000 mg | Freq: Four times a day (QID) | INTRAMUSCULAR | Status: DC | PRN

## 2022-05-11 MED ORDER — HYDROMORPHONE HCL 1 MG/ML IJ SOLN
INTRAMUSCULAR | Status: AC
  Filled 2022-05-11: qty 0.5

## 2022-05-11 MED ORDER — VENLAFAXINE HCL ER 75 MG PO CP24
150.0000 mg | ORAL_CAPSULE | Freq: Every day | ORAL | Status: DC
  Administered 2022-05-12: 150 mg via ORAL
  Filled 2022-05-11: qty 2
  Filled 2022-05-11: qty 1

## 2022-05-11 MED ORDER — FENTANYL CITRATE (PF) 250 MCG/5ML IJ SOLN
INTRAMUSCULAR | Status: DC | PRN
  Administered 2022-05-11 (×4): 50 ug via INTRAVENOUS
  Administered 2022-05-11: 100 ug via INTRAVENOUS
  Administered 2022-05-11: 50 ug via INTRAVENOUS

## 2022-05-11 MED ORDER — ROCURONIUM BROMIDE 10 MG/ML (PF) SYRINGE
PREFILLED_SYRINGE | INTRAVENOUS | Status: AC
  Filled 2022-05-11: qty 10

## 2022-05-11 MED ORDER — SODIUM CHLORIDE 0.9 % IV SOLN: 8.0000 mg | Freq: Four times a day (QID) | INTRAVENOUS | Status: DC | PRN

## 2022-05-11 MED ORDER — FENTANYL CITRATE (PF) 250 MCG/5ML IJ SOLN
INTRAMUSCULAR | Status: AC
  Filled 2022-05-11: qty 5

## 2022-05-11 MED ORDER — KETOROLAC TROMETHAMINE 30 MG/ML IJ SOLN
INTRAMUSCULAR | Status: AC
  Filled 2022-05-11: qty 1

## 2022-05-11 MED ORDER — LIDOCAINE 2% (20 MG/ML) 5 ML SYRINGE
INTRAMUSCULAR | Status: DC | PRN
  Administered 2022-05-11: 100 mg via INTRAVENOUS

## 2022-05-11 MED ORDER — PHENYLEPHRINE 80 MCG/ML (10ML) SYRINGE FOR IV PUSH (FOR BLOOD PRESSURE SUPPORT)
PREFILLED_SYRINGE | INTRAVENOUS | Status: DC | PRN
  Administered 2022-05-11: 160 ug via INTRAVENOUS

## 2022-05-11 MED ORDER — LABETALOL HCL 5 MG/ML IV SOLN
INTRAVENOUS | Status: AC
  Filled 2022-05-11: qty 4

## 2022-05-11 MED ORDER — ALUM & MAG HYDROXIDE-SIMETH 200-200-20 MG/5ML PO SUSP: 30.0000 mL | ORAL | Status: DC | PRN

## 2022-05-11 MED ORDER — ALBUMIN HUMAN 5 % IV SOLN
25.0000 g | Freq: Once | INTRAVENOUS | Status: AC
  Filled 2022-05-11: qty 500

## 2022-05-11 MED ORDER — GABAPENTIN 300 MG PO CAPS
300.0000 mg | ORAL_CAPSULE | Freq: Three times a day (TID) | ORAL | Status: DC
  Administered 2022-05-11 – 2022-05-12 (×3): 300 mg via ORAL
  Filled 2022-05-11 (×3): qty 1

## 2022-05-11 MED ORDER — ONDANSETRON HCL 4 MG/2ML IJ SOLN
INTRAMUSCULAR | Status: DC | PRN
  Administered 2022-05-11: 4 mg via INTRAVENOUS

## 2022-05-11 MED ORDER — DEXAMETHASONE SODIUM PHOSPHATE 10 MG/ML IJ SOLN
INTRAMUSCULAR | Status: AC
  Filled 2022-05-11: qty 1

## 2022-05-11 MED ORDER — OXYCODONE HCL 5 MG/5ML PO SOLN: 5.0000 mg | Freq: Once | ORAL | Status: DC | PRN

## 2022-05-11 MED ORDER — POVIDONE-IODINE 10 % EX SWAB: 2.0000 | Freq: Once | CUTANEOUS | Status: DC

## 2022-05-11 MED ORDER — METOCLOPRAMIDE HCL 5 MG/ML IJ SOLN
10.0000 mg | Freq: Once | INTRAMUSCULAR | Status: AC
  Administered 2022-05-11: 10 mg via INTRAVENOUS
  Filled 2022-05-11: qty 2

## 2022-05-11 MED ORDER — KETAMINE HCL 50 MG/5ML IJ SOSY
PREFILLED_SYRINGE | INTRAMUSCULAR | Status: AC
  Filled 2022-05-11: qty 5

## 2022-05-11 MED ORDER — OXYCODONE HCL 5 MG PO TABS
5.0000 mg | ORAL_TABLET | ORAL | Status: DC | PRN
  Administered 2022-05-11 – 2022-05-12 (×3): 10 mg via ORAL
  Filled 2022-05-11 (×3): qty 2

## 2022-05-11 MED ORDER — KCL IN DEXTROSE-NACL 20-5-0.45 MEQ/L-%-% IV SOLN
INTRAVENOUS | Status: DC
Start: 2022-05-11 — End: 2022-05-12

## 2022-05-11 MED ORDER — ATORVASTATIN CALCIUM 20 MG PO TABS
20.0000 mg | ORAL_TABLET | Freq: Every day | ORAL | Status: DC
  Administered 2022-05-11: 20 mg via ORAL
  Filled 2022-05-11: qty 1

## 2022-05-11 MED ORDER — FLUTICASONE FUROATE-VILANTEROL 100-25 MCG/ACT IN AEPB
1.0000 | INHALATION_SPRAY | Freq: Every day | RESPIRATORY_TRACT | Status: DC
  Administered 2022-05-12: 1 via RESPIRATORY_TRACT
  Filled 2022-05-11: qty 28

## 2022-05-11 MED ORDER — CEFAZOLIN IN SODIUM CHLORIDE 3-0.9 GM/100ML-% IV SOLN
3.0000 g | INTRAVENOUS | Status: AC
  Administered 2022-05-11: 3 g via INTRAVENOUS
  Filled 2022-05-11: qty 100

## 2022-05-11 MED ORDER — PROPOFOL 10 MG/ML IV BOLUS
INTRAVENOUS | Status: DC | PRN
  Administered 2022-05-11: 200 mg via INTRAVENOUS

## 2022-05-11 MED ORDER — METFORMIN HCL 500 MG PO TABS
500.0000 mg | ORAL_TABLET | Freq: Two times a day (BID) | ORAL | Status: DC
  Administered 2022-05-11 – 2022-05-12 (×2): 500 mg via ORAL
  Filled 2022-05-11 (×2): qty 1

## 2022-05-11 MED ORDER — SENNOSIDES-DOCUSATE SODIUM 8.6-50 MG PO TABS: 1.0000 | ORAL_TABLET | Freq: Every evening | ORAL | Status: DC | PRN

## 2022-05-11 MED ORDER — ONDANSETRON HCL 8 MG PO TABS: 8.0000 mg | ORAL_TABLET | Freq: Four times a day (QID) | ORAL | Status: DC | PRN

## 2022-05-11 MED ORDER — LABETALOL HCL 5 MG/ML IV SOLN
INTRAVENOUS | Status: DC | PRN
  Administered 2022-05-11 (×3): 5 mg via INTRAVENOUS

## 2022-05-11 MED ORDER — BENZONATATE 100 MG PO CAPS: 100.0000 mg | ORAL_CAPSULE | Freq: Three times a day (TID) | ORAL | Status: DC | PRN

## 2022-05-11 MED ORDER — LIDOCAINE HCL (PF) 2 % IJ SOLN
INTRAMUSCULAR | Status: AC
  Filled 2022-05-11: qty 5

## 2022-05-11 MED ORDER — ZOLPIDEM TARTRATE 5 MG PO TABS
5.0000 mg | ORAL_TABLET | Freq: Every evening | ORAL | Status: DC | PRN
  Administered 2022-05-11: 5 mg via ORAL
  Filled 2022-05-11: qty 1

## 2022-05-11 MED ORDER — HEMOSTATIC AGENTS (NO CHARGE) OPTIME
TOPICAL | Status: DC | PRN
  Administered 2022-05-11: 1

## 2022-05-11 MED ORDER — CHLORHEXIDINE GLUCONATE 0.12 % MT SOLN
OROMUCOSAL | Status: AC
  Filled 2022-05-11: qty 15

## 2022-05-11 MED ORDER — PROCHLORPERAZINE EDISYLATE 10 MG/2ML IJ SOLN
10.0000 mg | Freq: Four times a day (QID) | INTRAMUSCULAR | Status: DC | PRN
  Filled 2022-05-11: qty 2

## 2022-05-11 MED ORDER — IBUPROFEN 400 MG PO TABS: 600.0000 mg | ORAL_TABLET | Freq: Four times a day (QID) | ORAL | Status: DC

## 2022-05-11 MED ORDER — BUPIVACAINE-MELOXICAM ER 200-6 MG/7ML IJ SOLN
200.0000 mg | Freq: Once | INTRAMUSCULAR | Status: DC
  Filled 2022-05-11: qty 2

## 2022-05-11 MED ORDER — SODIUM CHLORIDE 0.9 % IR SOLN
Status: DC | PRN
  Administered 2022-05-11 (×2): 1000 mL

## 2022-05-11 MED ORDER — PROPOFOL 10 MG/ML IV BOLUS
INTRAVENOUS | Status: AC
  Filled 2022-05-11: qty 20

## 2022-05-11 MED ORDER — AMLODIPINE BESYLATE 2.5 MG PO TABS: 2.5000 mg | ORAL_TABLET | Freq: Every day | ORAL | Status: DC

## 2022-05-11 MED ORDER — OXYCODONE HCL 5 MG PO TABS: 5.0000 mg | ORAL_TABLET | Freq: Once | ORAL | Status: DC | PRN

## 2022-05-11 MED ORDER — CEFAZOLIN SODIUM-DEXTROSE 2-4 GM/100ML-% IV SOLN
INTRAVENOUS | Status: AC
  Filled 2022-05-11: qty 100

## 2022-05-11 MED ORDER — KETOROLAC TROMETHAMINE 30 MG/ML IJ SOLN
30.0000 mg | Freq: Once | INTRAMUSCULAR | Status: AC
  Administered 2022-05-11: 30 mg via INTRAVENOUS

## 2022-05-11 MED ORDER — LACTATED RINGERS IV SOLN: INTRAVENOUS | Status: DC

## 2022-05-11 MED ORDER — ONDANSETRON HCL 4 MG/2ML IJ SOLN
4.0000 mg | Freq: Once | INTRAMUSCULAR | Status: AC | PRN
  Administered 2022-05-11: 4 mg via INTRAVENOUS
  Filled 2022-05-11: qty 2

## 2022-05-11 MED ORDER — MIDAZOLAM HCL 2 MG/2ML IJ SOLN
INTRAMUSCULAR | Status: AC
  Filled 2022-05-11: qty 2

## 2022-05-11 MED ORDER — MIDAZOLAM HCL 5 MG/5ML IJ SOLN
INTRAMUSCULAR | Status: DC | PRN
  Administered 2022-05-11: 2 mg via INTRAVENOUS

## 2022-05-11 MED ORDER — HYDROMORPHONE HCL 1 MG/ML IJ SOLN
1.0000 mg | INTRAMUSCULAR | Status: DC | PRN
  Administered 2022-05-11: 1 mg via INTRAVENOUS
  Filled 2022-05-11: qty 1

## 2022-05-11 MED ORDER — ROCURONIUM BROMIDE 10 MG/ML (PF) SYRINGE
PREFILLED_SYRINGE | INTRAVENOUS | Status: DC | PRN
  Administered 2022-05-11: 70 mg via INTRAVENOUS
  Administered 2022-05-11: 10 mg via INTRAVENOUS
  Administered 2022-05-11 (×2): 20 mg via INTRAVENOUS

## 2022-05-11 MED ORDER — BISACODYL 10 MG RE SUPP: 10.0000 mg | Freq: Every day | RECTAL | Status: DC | PRN

## 2022-05-11 MED ORDER — BUPIVACAINE-MELOXICAM ER 200-6 MG/7ML IJ SOLN
INTRAMUSCULAR | Status: DC | PRN
  Administered 2022-05-11: 200 mg

## 2022-05-11 MED ORDER — ENOXAPARIN SODIUM 80 MG/0.8ML IJ SOSY
70.0000 mg | PREFILLED_SYRINGE | INTRAMUSCULAR | Status: DC
Start: 2022-05-12 — End: 2022-05-12
  Administered 2022-05-12: 70 mg via SUBCUTANEOUS
  Filled 2022-05-11: qty 0.8

## 2022-05-11 MED ORDER — CHLORHEXIDINE GLUCONATE 0.12 % MT SOLN
15.0000 mL | Freq: Once | OROMUCOSAL | Status: AC
  Administered 2022-05-11: 15 mL via OROMUCOSAL

## 2022-05-11 MED ORDER — ESMOLOL HCL 100 MG/10ML IV SOLN
INTRAVENOUS | Status: AC
  Filled 2022-05-11: qty 10

## 2022-05-11 MED ORDER — ALBUTEROL SULFATE (2.5 MG/3ML) 0.083% IN NEBU: 2.5000 mg | INHALATION_SOLUTION | RESPIRATORY_TRACT | Status: DC | PRN

## 2022-05-11 MED ORDER — ESMOLOL HCL 100 MG/10ML IV SOLN
INTRAVENOUS | Status: DC | PRN
  Administered 2022-05-11: 10 mg via INTRAVENOUS
  Administered 2022-05-11: 20 mg via INTRAVENOUS

## 2022-05-11 MED ORDER — KETOROLAC TROMETHAMINE 30 MG/ML IJ SOLN
30.0000 mg | Freq: Four times a day (QID) | INTRAMUSCULAR | Status: AC
  Administered 2022-05-11 – 2022-05-12 (×4): 30 mg via INTRAVENOUS
  Filled 2022-05-11 (×4): qty 1

## 2022-05-11 MED ORDER — HYDROMORPHONE HCL 1 MG/ML IJ SOLN
0.5000 mg | Freq: Once | INTRAMUSCULAR | Status: AC
  Administered 2022-05-11: 0.5 mg via INTRAVENOUS

## 2022-05-11 MED ORDER — SODIUM CHLORIDE 0.9 % IV SOLN
25.0000 mg | Freq: Four times a day (QID) | INTRAVENOUS | Status: DC | PRN
  Administered 2022-05-11: 25 mg via INTRAVENOUS
  Filled 2022-05-11: qty 1

## 2022-05-11 MED ORDER — KETAMINE HCL-SODIUM CHLORIDE 100-0.9 MG/10ML-% IV SOSY
PREFILLED_SYRINGE | INTRAVENOUS | Status: DC | PRN
  Administered 2022-05-11: 10 mg via INTRAVENOUS
  Administered 2022-05-11: 20 mg via INTRAVENOUS

## 2022-05-11 MED ORDER — SCOPOLAMINE 1 MG/3DAYS TD PT72
1.0000 | MEDICATED_PATCH | Freq: Once | TRANSDERMAL | Status: DC
  Administered 2022-05-11: 1.5 mg via TRANSDERMAL
  Filled 2022-05-11: qty 1

## 2022-05-11 MED ORDER — DEXMEDETOMIDINE HCL IN NACL 200 MCG/50ML IV SOLN
INTRAVENOUS | Status: DC | PRN
  Administered 2022-05-11 (×2): 8 ug via INTRAVENOUS

## 2022-05-11 MED ORDER — AMLODIPINE BESYLATE 5 MG PO TABS
5.0000 mg | ORAL_TABLET | Freq: Every day | ORAL | Status: DC
  Administered 2022-05-12: 5 mg via ORAL
  Filled 2022-05-11: qty 1

## 2022-05-11 MED ORDER — DEXAMETHASONE SODIUM PHOSPHATE 10 MG/ML IJ SOLN
INTRAMUSCULAR | Status: DC | PRN
  Administered 2022-05-11: 5 mg via INTRAVENOUS

## 2022-05-11 MED ORDER — SUGAMMADEX SODIUM 200 MG/2ML IV SOLN
INTRAVENOUS | Status: DC | PRN
  Administered 2022-05-11: 300 mg via INTRAVENOUS

## 2022-05-11 SURGICAL SUPPLY — 50 items
ADH SKN CLS APL DERMABOND .7 (GAUZE/BANDAGES/DRESSINGS) ×2
APPLIER CLIP 13 LRG OPEN (CLIP) ×3
APR CLP LRG 13 20 CLIP (CLIP) ×2
CLIP APPLIE 13 LRG OPEN (CLIP) IMPLANT
CLOTH BEACON ORANGE TIMEOUT ST (SAFETY) ×3 IMPLANT
COVER LIGHT HANDLE STERIS (MISCELLANEOUS) ×6 IMPLANT
DERMABOND ADVANCED (GAUZE/BANDAGES/DRESSINGS) ×1
DERMABOND ADVANCED .7 DNX12 (GAUZE/BANDAGES/DRESSINGS) ×2 IMPLANT
DRAPE WARM FLUID 44X44 (DRAPES) ×3 IMPLANT
DRSG OPSITE POSTOP 4X8 (GAUZE/BANDAGES/DRESSINGS) ×3 IMPLANT
ELECT REM PT RETURN 9FT ADLT (ELECTROSURGICAL) ×3
ELECTRODE REM PT RTRN 9FT ADLT (ELECTROSURGICAL) ×2 IMPLANT
GAUZE 4X4 16PLY ~~LOC~~+RFID DBL (SPONGE) ×3 IMPLANT
GLOVE BIO SURGEON STRL SZ7 (GLOVE) ×1 IMPLANT
GLOVE BIOGEL PI IND STRL 6.5 (GLOVE) IMPLANT
GLOVE BIOGEL PI IND STRL 7.0 (GLOVE) ×4 IMPLANT
GLOVE BIOGEL PI IND STRL 7.5 (GLOVE) IMPLANT
GLOVE BIOGEL PI IND STRL 8 (GLOVE) ×2 IMPLANT
GLOVE BIOGEL PI INDICATOR 6.5 (GLOVE) ×1
GLOVE BIOGEL PI INDICATOR 7.0 (GLOVE) ×2
GLOVE BIOGEL PI INDICATOR 7.5 (GLOVE) ×2
GLOVE BIOGEL PI INDICATOR 8 (GLOVE) ×1
GLOVE ECLIPSE 8.0 STRL XLNG CF (GLOVE) ×5 IMPLANT
GLOVE SURG SS PI 6.5 STRL IVOR (GLOVE) ×1 IMPLANT
GOWN STRL REUS W/TWL LRG LVL3 (GOWN DISPOSABLE) ×6 IMPLANT
GOWN STRL REUS W/TWL XL LVL3 (GOWN DISPOSABLE) ×3 IMPLANT
INST SET MAJOR GENERAL (KITS) ×3 IMPLANT
KIT BLADEGUARD II DBL (SET/KITS/TRAYS/PACK) ×3 IMPLANT
KIT TURNOVER KIT A (KITS) ×3 IMPLANT
MANIFOLD NEPTUNE II (INSTRUMENTS) ×4 IMPLANT
NS IRRIG 1000ML POUR BTL (IV SOLUTION) ×6 IMPLANT
PACK MAJOR ABDOMINAL (CUSTOM PROCEDURE TRAY) ×3 IMPLANT
PAD ARMBOARD 7.5X6 YLW CONV (MISCELLANEOUS) ×3 IMPLANT
POWDER SURGICEL 3.0 GRAM (HEMOSTASIS) ×1 IMPLANT
SET BASIN LINEN APH (SET/KITS/TRAYS/PACK) ×3 IMPLANT
SPONGE T-LAP 18X18 ~~LOC~~+RFID (SPONGE) ×10 IMPLANT
SUT CHROMIC 0 CT 1 (SUTURE) ×6 IMPLANT
SUT MNCRL+ AB 3-0 CT1 36 (SUTURE) IMPLANT
SUT MON AB 3-0 SH 27 (SUTURE) ×3 IMPLANT
SUT MONOCRYL AB 3-0 CT1 36IN (SUTURE) ×3
SUT PLAIN CT 1/2CIR 2-0 27IN (SUTURE) ×1 IMPLANT
SUT VIC AB 0 CT1 27 (SUTURE) ×15
SUT VIC AB 0 CT1 27XCR 8 STRN (SUTURE) ×4 IMPLANT
SUT VIC AB 0 CTX 36 (SUTURE) ×3
SUT VIC AB 0 CTX36XBRD ANTBCTR (SUTURE) ×2 IMPLANT
SUT VICRYL 3 0 (SUTURE) ×3 IMPLANT
SYR BULB IRRIG 60ML STRL (SYRINGE) ×3 IMPLANT
TOWEL ~~LOC~~+RFID 17X26 BLUE (SPONGE) ×3 IMPLANT
TRAY FOLEY MTR SLVR 16FR STAT (SET/KITS/TRAYS/PACK) ×3 IMPLANT
YANKAUER SUCT BULB TIP 10FT TU (MISCELLANEOUS) ×1 IMPLANT

## 2022-05-11 NOTE — Transfer of Care (Signed)
Immediate Anesthesia Transfer of Care Note  Patient: Joanna Newman  Procedure(s) Performed: HYSTERECTOMY ABDOMINAL (Abdomen) OPEN LEFT SALPINGECTOMY AND OOPHORECTOMY (Left: Abdomen)  Patient Location: PACU  Anesthesia Type:General  Level of Consciousness: awake, alert , oriented and patient cooperative  Airway & Oxygen Therapy: Patient Spontanous Breathing and Patient connected to face mask oxygen  Post-op Assessment: Report given to RN, Post -op Vital signs reviewed and stable and Patient moving all extremities  Post vital signs: Reviewed and stable  Last Vitals:  Vitals Value Taken Time  BP 172/106 05/11/22 1100  Temp 36.4 C 05/11/22 1058  Pulse 90 05/11/22 1102  Resp 21 05/11/22 1102  SpO2 97 % 05/11/22 1102  Vitals shown include unvalidated device data.  Last Pain:  Vitals:   05/11/22 1058  PainSc: Asleep         Complications: No notable events documented.

## 2022-05-11 NOTE — H&P (Signed)
Preoperative History and Physical  Joanna Newman is a 51 y.o. [id number] with No LMP recorded. Patient is perimenopausal. admitted for a abdominal supracervical hysterectomy with removal of Fallopian tubes.  Pt has an enlarged fibroid uterus 851 cc by sonogram She was having increasing pain over the past almost year Bleeding has been irregular, not particularly heavy but intermittent Unresponsive to megestrol Could not get myfembree covered My objective was to avoid surgery in this patient because of her coexistent medical issues but her pain has worsened, likely degenrating fibroids, given her age and symptoms As a result will proceed with abdominal supracervical hysterectomy  PMH:    Past Medical History:  Diagnosis Date   Anxiety    Arthritis    Asthma    Bilateral ovarian cysts    Depression    Diabetes mellitus without complication (HCC)    Epidermoid cyst of finger    tip of left little finger   Fibroids    GERD (gastroesophageal reflux disease)    Hyperlipidemia    Hypertension    Insomnia    LVH (left ventricular hypertrophy)    Moderate   Obesity    Pain of right heel    Sleep apnea    History of no problems since weight loss   Stroke ([place]) 07/2016   No residual   Substance abuse ([place])    cocaine, marijuana and alcohol abuse. quit end of 2012    [place]:     Past Surgical History:  Procedure Laterality Date   CHOLECYSTECTOMY     COLONOSCOPY WITH PROPOFOL N/A 08/31/2021   Procedure: COLONOSCOPY WITH PROPOFOL;  Surgeon: Eloise Harman, DO;  [facility]: [facility];  Service: Endoscopy;  Laterality: N/A;  10:00am   HEEL SPUR SURGERY Bilateral    KNEE SURGERY     LAPAROTOMY WITH STAGING N/A 02/14/2018   Procedure: MINI-LAPAROTOMY;  Surgeon: Everitt Amber, MD;  Location: [facility];  Service: Gynecology;  Laterality: N/A;   LYSIS OF ADHESION  02/14/2018   Procedure: LYSIS OF ADHESION;  Surgeon: Everitt Amber, MD;  Location: [facility];  Service: Gynecology;;    POLYPECTOMY  08/31/2021   Procedure: POLYPECTOMY;  Surgeon: Eloise Harman, DO;  Location: [facility];  Service: Endoscopy;;   ROBOTIC ASSISTED TOTAL HYSTERECTOMY WITH BILATERAL SALPINGO OOPHERECTOMY N/A 02/14/2018   Procedure: XI ROBOTIC ASSISTED RIGHT SALPINGO OOPHORECTOMY, OMENTECTOMY;  Surgeon: Everitt Amber, MD;  Location: [facility];  Service: Gynecology;  Laterality: N/A;   TUBAL LIGATION      POb/GynH:      OB History     Gravida  5   Para  3   Term  3   Preterm      AB  2   Living  3      SAB      IAB  2   Ectopic      Multiple      Live Births              SH:   Social History   Tobacco Use   Smoking status: Former    Packs/day: 0.25    Years: 15.00    Total pack years: 3.75    Types: Cigarettes    Quit date: 03/11/2014    Years since quitting: 8.1   Smokeless tobacco: Never  Vaping Use   Vaping Use: Never used  Substance Use Topics   Alcohol use: Yes    Comment: occ; more in past   Drug use: Not  Currently    Types: Marijuana    Comment: Last use 08/31/21 history of substance abuse; last marijuana use about 1 week ago    FH:    Family History  Problem Relation Age of Onset   Diabetes Paternal Grandmother    Diabetes Maternal Grandmother    CVA Maternal Grandmother 68   Heart disease Father 80       heart failure   Diabetes Father    Heart failure Father    Hypertension Mother    Cancer Mother 36       colon   Kidney failure Sister    CVA Maternal Aunt 38       Aneurysm     Allergies:  Allergies  Allergen Reactions   Dapagliflozin Other (See Comments)    Yeast infection   Lisinopril Swelling    Medications:       Current Facility-Administered Medications:    bupivacaine-meloxicam ER (ZYNRELEF) injection 200 mg, 200 mg, Infiltration, Once, Jana Swartzlander, Mertie Clause, MD   ceFAZolin (ANCEF) 2-4 GM/100ML-% IVPB, , , ,    ceFAZolin (ANCEF) IVPB 3g/100 mL premix, 3 g, Intravenous, On Call to OR, Florian Buff, MD   chlorhexidine  (PERIDEX) 0.12 % solution, , , ,    ketorolac (TORADOL) 30 MG/ML injection 30 mg, 30 mg, Intravenous, Once, Florian Buff, MD   ketorolac (TORADOL) 30 MG/ML injection, , , ,    povidone-iodine 10 % swab 2 Application, 2 Application, Topical, Once, Florian Buff, MD  Review of Systems:   Review of Systems  Constitutional: Negative for fever, chills, weight loss, malaise/fatigue and diaphoresis.  HENT: Negative for hearing loss, ear pain, nosebleeds, congestion, sore throat, neck pain, tinnitus and ear discharge.   Eyes: Negative for blurred vision, double vision, photophobia, pain, discharge and redness.  Respiratory: Negative for cough, hemoptysis, sputum production, shortness of breath, wheezing and stridor.   Cardiovascular: Negative for chest pain, palpitations, orthopnea, claudication, leg swelling and PND.  Gastrointestinal: Positive for abdominal pain. Negative for heartburn, nausea, vomiting, diarrhea, constipation, blood in stool and melena.  Genitourinary: Negative for dysuria, urgency, frequency, hematuria and flank pain.  Musculoskeletal: Negative for myalgias, back pain, joint pain and falls.  Skin: Negative for itching and rash.  Neurological: Negative for dizziness, tingling, tremors, sensory change, speech change, focal weakness, seizures, loss of consciousness, weakness and headaches.  Endo/Heme/Allergies: Negative for environmental allergies and polydipsia. Does not bruise/bleed easily.  Psychiatric/Behavioral: Negative for depression, suicidal ideas, hallucinations, memory loss and substance abuse. The patient is not nervous/anxious and does not have insomnia.      PHYSICAL EXAM:  There were no vitals taken for this visit.    Vitals reviewed. Constitutional: She is oriented to person, place, and time. She appears well-developed and well-nourished.  HENT:  Head: Normocephalic and atraumatic.  Right Ear: External ear normal.  Left Ear: External ear normal.  Nose:  Nose normal.  Mouth/Throat: Oropharynx is clear and moist.  Eyes: Conjunctivae and EOM are normal. Pupils are equal, round, and reactive to light. Right eye exhibits no discharge. Left eye exhibits no discharge. No scleral icterus.  Neck: Normal range of motion. Neck supple. No tracheal deviation present. No thyromegaly present.  Cardiovascular: Normal rate, regular rhythm, normal heart sounds and intact distal pulses.  Exam reveals no gallop and no friction rub.   No murmur heard. Respiratory: Effort normal and breath sounds normal. No respiratory distress. She has no wheezes. She has no rales. She exhibits no tenderness.  GI: Soft. Bowel sounds are normal. She exhibits no distension and no mass. There is tenderness. There is no rebound and no guarding.  Genitourinary:       Vulva is normal without lesions Vagina is pink moist without discharge Cervix normal in appearance and pap is normal Uterus is 20 weeks size fibroid uterus Adnexa is negative with normal sized ovaries by sonogram  Musculoskeletal: Normal range of motion. She exhibits no edema and no tenderness.  Neurological: She is alert and oriented to person, place, and time. She has normal reflexes. She displays normal reflexes. No cranial nerve deficit. She exhibits normal muscle tone. Coordination normal.  Skin: Skin is warm and dry. No rash noted. No erythema. No pallor.  Psychiatric: She has a normal mood and affect. Her behavior is normal. Judgment and thought content normal.    Labs: Results for orders placed or performed during the hospital encounter of 05/11/22 (from the past 336 hour(s))  Glucose, capillary   Collection Time: 05/11/22  7:14 AM  Result Value Ref Range   Glucose-Capillary 141 (H) 70 - 99 mg/dL  Results for orders placed or performed during the hospital encounter of 05/06/22 (from the past 336 hour(s))  Pregnancy, urine POC   Collection Time: 05/06/22  2:07 PM  Result Value Ref Range   Preg Test, Ur  NEGATIVE NEGATIVE  Hemoglobin A1c   Collection Time: 05/06/22  2:16 PM  Result Value Ref Range   Hgb A1c MFr Bld 7.5 (H) 4.8 - 5.6 %   Mean Plasma Glucose 168.55 mg/dL  CBC with Differential/Platelet   Collection Time: 05/06/22  2:16 PM  Result Value Ref Range   WBC 8.8 4.0 - 10.5 K/uL   RBC 4.46 3.87 - 5.11 MIL/uL   Hemoglobin 12.7 12.0 - 15.0 g/dL   HCT 39.5 36.0 - 46.0 %   MCV 88.6 80.0 - 100.0 fL   MCH 28.5 26.0 - 34.0 pg   MCHC 32.2 30.0 - 36.0 g/dL   RDW 14.6 11.5 - 15.5 %   Platelets 396 150 - 400 K/uL   nRBC 0.0 0.0 - 0.2 %   Neutrophils Relative % 42 %   Neutro Abs 3.7 1.7 - 7.7 K/uL   Lymphocytes Relative 44 %   Lymphs Abs 3.8 0.7 - 4.0 K/uL   Monocytes Relative 5 %   Monocytes Absolute 0.4 0.1 - 1.0 K/uL   Eosinophils Relative 8 %   Eosinophils Absolute 0.7 (H) 0.0 - 0.5 K/uL   Basophils Relative 1 %   Basophils Absolute 0.1 0.0 - 0.1 K/uL   Immature Granulocytes 0 %   Abs Immature Granulocytes 0.02 0.00 - 0.07 K/uL  Comprehensive metabolic panel   Collection Time: 05/06/22  2:16 PM  Result Value Ref Range   Sodium 138 135 - 145 mmol/L   Potassium 3.9 3.5 - 5.1 mmol/L   Chloride 109 98 - 111 mmol/L   CO2 22 22 - 32 mmol/L   Glucose, Bld 102 (H) 70 - 99 mg/dL   BUN 9 6 - 20 mg/dL   Creatinine, Ser 0.99 0.44 - 1.00 mg/dL   Calcium 8.8 (L) 8.9 - 10.3 mg/dL   Total Protein 7.4 6.5 - 8.1 g/dL   Albumin 4.4 3.5 - 5.0 g/dL   AST 14 (L) 15 - 41 U/L   ALT 15 0 - 44 U/L   Alkaline Phosphatase 70 38 - 126 U/L   Total Bilirubin 0.7 0.3 - 1.2 mg/dL   GFR, Estimated >60 >60 mL/min  Anion gap 7 5 - 15  Rapid HIV screen (HIV 1/2 Ab+Ag)   Collection Time: 05/06/22  2:16 PM  Result Value Ref Range   HIV-1 P24 Antigen - HIV24 NON REACTIVE NON REACTIVE   HIV 1/2 Antibodies NON REACTIVE NON REACTIVE   Interpretation (HIV Ag Ab)      A non reactive test result means that HIV 1 or HIV 2 antibodies and HIV 1 p24 antigen were not detected in the specimen.  Urinalysis,  Routine w reflex microscopic Urine, Clean Catch   Collection Time: 05/06/22  2:16 PM  Result Value Ref Range   Color, Urine YELLOW YELLOW   APPearance CLEAR CLEAR   Specific Gravity, Urine 1.008 1.005 - 1.030   pH 6.0 5.0 - 8.0   Glucose, UA NEGATIVE NEGATIVE mg/dL   Hgb urine dipstick LARGE (A) NEGATIVE   Bilirubin Urine NEGATIVE NEGATIVE   Ketones, ur NEGATIVE NEGATIVE mg/dL   Protein, ur NEGATIVE NEGATIVE mg/dL   Nitrite NEGATIVE NEGATIVE   Leukocytes,Ua MODERATE (A) NEGATIVE   RBC / HPF >50 (H) 0 - 5 RBC/hpf   WBC, UA 11-20 0 - 5 WBC/hpf   Bacteria, UA RARE (A) NONE SEEN   Squamous Epithelial / LPF 0-5 0 - 5   Mucus PRESENT   Type and screen   Collection Time: 05/06/22  2:16 PM  Result Value Ref Range   ABO/RH(D) A POS    Antibody Screen NEG    Sample Expiration 05/20/2022,2359    Extend sample reason      NO TRANSFUSIONS OR PREGNANCY IN THE PAST 3 MONTHS Performed at [facility], [address], [place], [place] [address]     EKG: Orders placed or performed in visit on 04/08/22   EKG 12-Lead    Imaging Studies: CLINICAL DATA:  Irregular menstrual bleeding and cramping, bleeding 1 week ago, LMP 1 year ago, prior RIGHT salpingo oophorectomy 2019, diabetes mellitus, hypertension, former smoker, morbid obesity   EXAM: TRANSABDOMINAL AND TRANSVAGINAL ULTRASOUND OF PELVIS   TECHNIQUE: Both transabdominal and transvaginal ultrasound examinations of the pelvis were performed. Transabdominal technique was performed for global imaging of the pelvis including uterus, ovaries, adnexal regions, and pelvic cul-de-sac. It was necessary to proceed with endovaginal exam following the transabdominal exam to visualize the uterus, endometrium, and ovaries.   COMPARISON:  12/31/2017   FINDINGS: Uterus   Measurements: 19.8 x 7.5 x 10.9 cm = volume: 851 mL. Anteverted. Large uterine masses consistent with leiomyomata. These include a posterior lower uterine segment transmural  leiomyoma extending posterior to the cervix, 8.5 x 5.6 x 6.6 cm, exophytic fundal leiomyoma 6.3 x 3.6 x 5.0 cm, and a subserosal posterior upper uterine leiomyoma 7.3 x 6.0 x 9.0 cm.   Endometrium   Obscured by multiple uterine leiomyomata   Right ovary   Surgically absent   Left ovary   Not visualized, likely obscured by a combination of enlarged uterus containing multiple masses and gas in adjacent bowel loops   Other findings   No free pelvic fluid.  No adnexal masses definitely visualized.   IMPRESSION: Surgical absence of RIGHT ovary and nonvisualization of LEFT ovary.   Enlarged uterus containing multiple leiomyomata.   Inadequately visualized endometrial complex and LEFT ovary.     Electronically Signed   By: Lavonia Dana M.D.   On: 10/13/2021 15:43    Assessment: 1. Fibroids, 851 cc  D21.9     no response to megestrol, myfembree was not able to get covered  2.  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 DUB (dysfunctional uterine bleeding)  N93.8     minimal bleeding  4. History of stroke       Plan: Abdominal supracervical hysterectomy with removal of tubes, ovary if indicated  Pt understands the risks of surgery including but not limited t  excessive bleeding requiring transfusion or reoperation, post-operative infection requiring prolonged hospitalization or re-hospitalization and antibiotic therapy, and damage to other organs including bladder, bowel, ureters and major vessels.  The patient also understands the alternative treatment options which were discussed in full.  All questions were answered.  Florian Buff 05/11/2022 7:19 AM   Florian Buff 05/11/2022 7:16 AM

## 2022-05-11 NOTE — Progress Notes (Signed)
Paged Dr. Elonda Husky : Joanna Newman: Pt has fever 100.4. Foley out, pt has voided 171m. No tylenol ordered. Pt on toradol.   Dr. EElonda Huskycalled back. Made aware of patients elevated BP. No tylenol, encourage IS. Gave pt Incentive spirometer and educated pt and husband on proper use. Pt demonstrated properly. Encouraged minimum 10x hr every hr awake. Pt verbalized understanding.

## 2022-05-11 NOTE — Progress Notes (Signed)
PT was able to stand at the edge of the bed and take a few steps to the bathroom. Pts gait was unsteady. Pt states "I  would like to sleep". Pt has complained of 8/10 pain several times requiring PRN medication.

## 2022-05-11 NOTE — Progress Notes (Signed)
Inpatient Diabetes Program Recommendations  AACE/ADA: New Consensus Statement on Inpatient Glycemic Control (2015)  Target Ranges:  Prepandial:   less than 140 mg/dL      Peak postprandial:   less than 180 mg/dL (1-2 hours)      Critically ill patients:  140 - 180 mg/dL   Lab Results  Component Value Date   GLUCAP 261 (H) 05/11/2022   HGBA1C 7.5 (H) 05/06/2022    Review of Glycemic Control  Latest Reference Range & Units 05/11/22 12:01  Glucose-Capillary 70 - 99 mg/dL 261 (H)   Diabetes history: DM 2 Outpatient Diabetes medications: Metformin 500 mg bid Current orders for Inpatient glycemic control:  Metformin 500 mg bid  A1c 7.5% on 7/27  Inpatient Diabetes Program Recommendations:    MD Note: Pt received Decadron 5 mg during surgery.  -  Consider adding Novolog 0-15 units Q4 hours while in the hospital after steroid dose.  Thanks,  Tama Headings RN, MSN, BC-ADM Inpatient Diabetes Coordinator Team Pager [phone] (8a-5p)

## 2022-05-11 NOTE — Progress Notes (Signed)
Pt arrived to room 311 via stretcher from PACU s/p Abd Hysterectomy. Pt moved by  staff from stretcher to bed using transfer sheet. Honeycomb dressing D&I to low abd surgical site, no drainage noted. Foley cath intact draining clear yellow urine. IV site WNL, no s/s infiltration. SCD's on, call bell in reach. Pt instructed on splinting abd with pillow for pain relief with coughing and movement, gives satisfactory return demonstration.

## 2022-05-11 NOTE — Progress Notes (Signed)
   05/11/22 1758  Vitals  BP (!) 162/95  MAP (mmHg) 115  Pulse Rate (!) 111  Resp 16  Level of Consciousness  Level of Consciousness Alert  MEWS COLOR  MEWS Score Color Yellow  Oxygen Therapy  SpO2 100 %  O2 Device Room Air  Pain Assessment  Pain Scale 0-10  Pain Score 5  MEWS Score  MEWS Temp 0  MEWS Systolic 0  MEWS Pulse 2  MEWS RR 0  MEWS LOC 0  MEWS Score 2  Provider Notification  Provider Name/Title Tania Ade MD  Date Provider Notified 05/11/22  Time Provider Notified [phone]  Notification Reason Other (Comment) (Yellow MEWS)

## 2022-05-11 NOTE — Anesthesia Preprocedure Evaluation (Signed)
Anesthesia Evaluation  Patient identified by MRN, date of birth, ID band Patient awake    Reviewed: Allergy & Precautions, H&P , NPO status , Patient's Chart, lab work & pertinent test results, reviewed documented beta blocker date and time   Airway Mallampati: II  TM Distance: >3 FB Neck ROM: full    Dental no notable dental hx.    Pulmonary asthma , sleep apnea , former smoker,    Pulmonary exam normal breath sounds clear to auscultation       Cardiovascular Exercise Tolerance: Good hypertension, negative cardio ROS   Rhythm:regular Rate:Normal     Neuro/Psych PSYCHIATRIC DISORDERS Anxiety Depression TIA   GI/Hepatic Neg liver ROS, GERD  Medicated,  Endo/Other  diabetes, Type 2Morbid obesity  Renal/GU negative Renal ROS  negative genitourinary   Musculoskeletal   Abdominal   Peds  Hematology negative hematology ROS (+)   Anesthesia Other Findings   Reproductive/Obstetrics negative OB ROS                             Anesthesia Physical Anesthesia Plan  ASA: 3  Anesthesia Plan: General and General ETT   Post-op Pain Management:    Induction:   PONV Risk Score and Plan: Ondansetron and Scopolamine patch - Pre-op  Airway Management Planned:   Additional Equipment:   Intra-op Plan:   Post-operative Plan:   Informed Consent: I have reviewed the patients History and Physical, chart, labs and discussed the procedure including the risks, benefits and alternatives for the proposed anesthesia with the patient or authorized representative who has indicated his/her understanding and acceptance.     Dental Advisory Given  Plan Discussed with: CRNA  Anesthesia Plan Comments:         Anesthesia Quick Evaluation

## 2022-05-11 NOTE — Op Note (Signed)
Preoperative diagnosis:  1.  Enlarged fibroid uterus, >800cc                                          2.  Pelvic pain                                         3.  Irregual bleeding                                         4.  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 [id number] with No LMP recorded. Patient is perimenopausal. admitted for a abdominal supracervical hysterectomy with removal of Fallopian tubes.  Pt has an enlarged fibroid uterus 851 cc by sonogram She was having increasing pain over the past almost year Bleeding has been irregular, not particularly heavy but intermittent Unresponsive to megestrol Could not get myfembree covered My objective was to avoid surgery in this patient because of her coexistent medical issues but her pain has worsened, likely degenrating fibroids, given her age and symptoms As a result will proceed with abdominal supracervical hysterectomy  Intraoperative findings: uterus and large posterior myoma much larger than measured by sonogram, probably degerative changes, posterior fibroid was incarcerated in the pelvis with parasitic blood supply between the uterus and the myoma.  Large venous communication  Description of operation:  Patient was taken to the operating room and placed in the supine position where she underwent general endotracheal anesthesia.  She was then prepped and draped in the usual sterile fashion and a Foley catheter was placed for continuous bladder drainage.  A Pfannenstiel skin incision was made and carried down sharply to the rectus fascia which was scored in the midline and extended laterally.  The fascia was  taken off the muscles superiorly and inferiorly without difficulty.  The muscles were divided.  The peritoneal cavity was entered.  An medium Alexis self-retaining retractor was placed.  The upper abdomen was packed away. Both uterine cornu were grasped with Coker clamps.  The left round ligament was suture ligated and coagulated with the electrocautery unit.  The left vesicouterine serosal flap was created.  An avascular window in in the peritoneum was created and the utero-ovarian ligament was cross clamped, cut and suture ligated.  The right round ligament was suture ligated and cut with the electrocautery unit.  The vesicouterine serosal flap on the right was created.   The uterine vessels were clamped bilaterally,  then cut and suture ligated.  several more pedicles were taken down the cervix medial to the uterine vessels.  Each pedicle was clamped cut and suture ligated with good resulting hemostasis.    The posterior myoma which may have been fundal and distorting the uterus posteriorly was removed seperately after the uterine vessels were suture ligated. There was a large amount of parasitic blood supply between this fibroid and the uterus  resulting in a large blood loss. Once I got the fibroid liberated from the pelvis the bleeding was managed but I had to morcellate it to remove it.  I would say the overall volume was more like 1500 cc total. I then removed the cervix because of the anatomy distortion and the large amount of redundant peritoneum. I also removed the left tube and ovary after all the dissection disrupted its blood supply   The pelvis was irrigated vigorously and all pedicles were examined and found to be hemostatic.    All specimens were sent to pathology for routine evaluation.  The Alexis self-retaining retractor was removed and the pelvis was irrigated vigorously.  All packs were removed and all counts were correct at this point x 3.  The muscles and peritoneum were reapproximated  loosely.  The fascia was closed with 0 Vicryl running.  The subcutaneous tissue was reapproximated using 2-0 plain gut.  The skin was closed using 3-0 Vicryl on a Keith needle in a subcuticular fashion.  Dermabond was then applied for additional wound integrity and to serve as a postoperative bacterial barrier.  The patient was awakened from anesthesia taken to the recovery room in good stable condition. All sponge instrument and needle counts were correct x 3.  The patient received Ancef and Toradol prophylactically preoperatively.  Estimated blood loss for the procedure was 1500  cc.   Florian Buff, MD 05/11/2022 12:36 PM

## 2022-05-11 NOTE — Anesthesia Procedure Notes (Signed)
Procedure Name: Intubation Date/Time: 05/11/2022 7:42 AM  Performed by: Myna Bright, CRNAPre-anesthesia Checklist: Patient identified, Emergency Drugs available, Suction available and Patient being monitored Patient Re-evaluated:Patient Re-evaluated prior to induction Oxygen Delivery Method: Circle system utilized Preoxygenation: Pre-oxygenation with 100% oxygen Induction Type: IV induction Ventilation: Mask ventilation without difficulty and Oral airway inserted - appropriate to patient size Laryngoscope Size: Mac and 3 Grade View: Grade I Tube type: Oral Tube size: 7.0 mm Number of attempts: 1 Airway Equipment and Method: Stylet Placement Confirmation: ETT inserted through vocal cords under direct vision, positive ETCO2 and breath sounds checked- equal and bilateral Secured at: 21 cm Tube secured with: Tape Dental Injury: Teeth and Oropharynx as per pre-operative assessment

## 2022-05-12 ENCOUNTER — Encounter (HOSPITAL_COMMUNITY): Payer: Self-pay | Admitting: Obstetrics & Gynecology

## 2022-05-12 LAB — CBC
HCT: 27.8 % — ABNORMAL LOW (ref 36.0–46.0)
Hemoglobin: 8.8 g/dL — ABNORMAL LOW (ref 12.0–15.0)
MCH: 28.6 pg (ref 26.0–34.0)
MCHC: 31.7 g/dL (ref 30.0–36.0)
MCV: 90.3 fL (ref 80.0–100.0)
Platelets: 303 10*3/uL (ref 150–400)
RBC: 3.08 MIL/uL — ABNORMAL LOW (ref 3.87–5.11)
RDW: 14.6 % (ref 11.5–15.5)
WBC: 11.6 10*3/uL — ABNORMAL HIGH (ref 4.0–10.5)
nRBC: 0 % (ref 0.0–0.2)

## 2022-05-12 LAB — BASIC METABOLIC PANEL
Anion gap: 10 (ref 5–15)
BUN: 11 mg/dL (ref 6–20)
CO2: 22 mmol/L (ref 22–32)
Calcium: 8.3 mg/dL — ABNORMAL LOW (ref 8.9–10.3)
Chloride: 108 mmol/L (ref 98–111)
Creatinine, Ser: 1.04 mg/dL — ABNORMAL HIGH (ref 0.44–1.00)
GFR, Estimated: >60 mL/min (ref >60)
Glucose, Bld: 197 mg/dL — ABNORMAL HIGH (ref 70–99)
Potassium: 4 mmol/L (ref 3.5–5.1)
Sodium: 140 mmol/L (ref 135–145)

## 2022-05-12 MED ORDER — ONDANSETRON HCL 8 MG PO TABS: 8.0000 mg | ORAL_TABLET | Freq: Three times a day (TID) | ORAL | 0 refills | Status: DC | PRN

## 2022-05-12 MED ORDER — OXYCODONE HCL 5 MG PO TABS: 5.0000 mg | ORAL_TABLET | ORAL | 0 refills | Status: DC | PRN

## 2022-05-12 MED ORDER — KETOROLAC TROMETHAMINE 10 MG PO TABS: 10.0000 mg | ORAL_TABLET | Freq: Three times a day (TID) | ORAL | 0 refills | Status: DC | PRN

## 2022-05-12 NOTE — Anesthesia Postprocedure Evaluation (Signed)
Anesthesia Post Note  Patient: Joanna Newman  Procedure(s) Performed: HYSTERECTOMY ABDOMINAL (Abdomen) OPEN LEFT SALPINGECTOMY AND OOPHORECTOMY (Left: Abdomen)  Patient location during evaluation: Phase II Anesthesia Type: General Level of consciousness: awake Pain management: pain level controlled Vital Signs Assessment: post-procedure vital signs reviewed and stable Respiratory status: spontaneous breathing and respiratory function stable Cardiovascular status: blood pressure returned to baseline and stable Postop Assessment: no headache and no apparent nausea or vomiting Anesthetic complications: no Comments: Late entry   No notable events documented.   Last Vitals:  Vitals:   05/12/22 0603 05/12/22 0719  BP: (!) 157/91   Pulse: (!) 110   Resp: 18   Temp: 37.3 C   SpO2: 100% 100%    Last Pain:  Vitals:   05/12/22 0125  TempSrc:   PainSc: [place]

## 2022-05-12 NOTE — TOC Progression Note (Signed)
Transition of Care [facility]) - Progression Note    Patient Details  Name: TAYLEN WENDLAND MRN: [id number] Date of Birth: [birth year]  Transition of Care [facility]) CM/SW Contact  Salome Arnt, [place] Phone Number: 05/12/2022, 10:20 AM  Clinical Narrative:  Transition of Care [facility]) Screening Note   Patient Details  Name: ANIE JUNIEL Date of Birth: 1971-06-29   Transition of Care [facility]) CM/SW Contact:    Salome Arnt, [place] Phone Number: 05/12/2022, 10:20 AM    Transition of Care Department [facility]) has reviewed patient and no TOC needs have been identified at this time. We will continue to monitor patient advancement through interdisciplinary progression rounds. If new patient transition needs arise, please place a TOC consult.             Expected Discharge Plan and Services                                                 Social Determinants of Health (SDOH) Interventions    Readmission Risk Interventions     No data to display

## 2022-05-12 NOTE — Plan of Care (Signed)

## 2022-05-12 NOTE — Discharge Summary (Signed)
Physician Discharge Summary  Patient ID: Joanna Newman MRN: [id number] DOB/AGE: 52-Jan-1972 52 y.o.  Admit date: 05/11/2022 Discharge date: 05/12/2022  Admission Diagnoses: Enlarged fibroid uterus DUB Dysmenorrhea Pelvic pain  Discharge Diagnoses:  Principal Problem:   S/P hysterectomy with oophorectomy   Discharged Condition: stable  Hospital Course: unremarkable post op course  Consults: None  Significant Diagnostic Studies: labs:  Results for orders placed or performed during the hospital encounter of 05/11/22 (from the past 24 hour(s))  CBC     Status: Abnormal   Collection Time: 05/12/22  5:22 AM  Result Value Ref Range   WBC 11.6 (H) 4.0 - 10.5 K/uL   RBC 3.08 (L) 3.87 - 5.11 MIL/uL   Hemoglobin 8.8 (L) 12.0 - 15.0 g/dL   HCT 27.8 (L) 36.0 - 46.0 %   MCV 90.3 80.0 - 100.0 fL   MCH 28.6 26.0 - 34.0 pg   MCHC 31.7 30.0 - 36.0 g/dL   RDW 14.6 11.5 - 15.5 %   Platelets 303 150 - 400 K/uL   nRBC 0.0 0.0 - 0.2 %  Basic metabolic panel     Status: Abnormal   Collection Time: 05/12/22  5:22 AM  Result Value Ref Range   Sodium 140 135 - 145 mmol/L   Potassium 4.0 3.5 - 5.1 mmol/L   Chloride 108 98 - 111 mmol/L   CO2 22 22 - 32 mmol/L   Glucose, Bld 197 (H) 70 - 99 mg/dL   BUN 11 6 - 20 mg/dL   Creatinine, Ser 1.04 (H) 0.44 - 1.00 mg/dL   Calcium 8.3 (L) 8.9 - 10.3 mg/dL   GFR, Estimated >60 >60 mL/min   Anion gap 10 5 - 15     Treatments: surgery: TAH LSO  Discharge Exam: Blood pressure (!) 157/91, pulse (!) 110, temperature 99.2 F (37.3 C), resp. rate 18, height '5\' 4"'$  (1.626 m), weight (!) 140.4 kg, SpO2 100 %. General appearance: alert, cooperative, and no distress GI: soft, non-tender; bowel sounds normal; no masses,  no organomegaly Incision/Wound:clean dry intact  Disposition: Discharge disposition: 01-Home or Self Care       Discharge Instructions     Call MD for:  persistant nausea and vomiting   Complete by: As directed    Call MD for:   severe uncontrolled pain   Complete by: As directed    Call MD for:  temperature >100.4   Complete by: As directed    Diet - low sodium heart healthy   Complete by: As directed    Driving Restrictions   Complete by: As directed    No driving for 1 week   If the dressing is still on your incision site when you go home, remove it on the third day after your surgery date. Remove dressing if it begins to fall off, or if it is dirty or damaged before the third day.   Complete by: As directed    Increase activity slowly   Complete by: As directed    Lifting restrictions   Complete by: As directed    Do not lift more than 10 pounds for 6 weeks   Sexual Activity Restrictions   Complete by: As directed    No sex for 8 weeks      Allergies as of 05/12/2022       Reactions   Dapagliflozin Other (See Comments)   Yeast infection   Lisinopril Swelling        Medication List  STOP taking these medications    HYDROcodone-acetaminophen 5-325 MG tablet Commonly known as: NORCO/VICODIN   megestrol 40 MG tablet Commonly known as: MEGACE       TAKE these medications    albuterol 108 (90 Base) MCG/ACT inhaler Commonly known as: VENTOLIN HFA Inhale 2 puffs into the lungs every 4 (four) hours as needed for wheezing or shortness of breath.   amLODipine 2.5 MG tablet Commonly known as: NORVASC Take 1 tablet (2.5 mg total) by mouth daily.   amLODipine 5 MG tablet Commonly known as: NORVASC Take 5 mg by mouth daily.   aspirin EC 325 MG tablet Take 1 tablet (325 mg total) by mouth daily.   atorvastatin 20 MG tablet Commonly known as: LIPITOR Take 1 tablet (20 mg total) by mouth daily at 6 PM.   benzonatate 100 MG capsule Commonly known as: TESSALON Take 1 capsule (100 mg total) by mouth 3 (three) times daily as needed for cough. Do not take with alcohol or while driving or operating heavy machinery   clobetasol cream 0.05 % Commonly known as: TEMOVATE Apply 1 application  topically daily as needed (eczema).   diclofenac Sodium 1 % Gel Commonly known as: Voltaren Apply 2 g topically 4 (four) times daily.   fluticasone 0.05 % cream Commonly known as: CUTIVATE Apply topically 2 (two) times daily. For one week then daily, gradually taper use What changed:  how much to take when to take this reasons to take this additional instructions   Gerhardt's butt cream Crea Apply 1 Application topically 3 (three) times daily.   ketorolac 10 MG tablet Commonly known as: TORADOL Take 1 tablet (10 mg total) by mouth every 8 (eight) hours as needed. What changed: Another medication with the same name was added. Make sure you understand how and when to take each.   ketorolac 10 MG tablet Commonly known as: TORADOL Take 1 tablet (10 mg total) by mouth every 8 (eight) hours as needed. What changed: You were already taking a medication with the same name, and this prescription was added. Make sure you understand how and when to take each.   metFORMIN 500 MG tablet Commonly known as: GLUCOPHAGE Take 500 mg by mouth 2 (two) times daily.   ondansetron 8 MG tablet Commonly known as: Zofran Take 1 tablet (8 mg total) by mouth every 8 (eight) hours as needed for nausea or vomiting.   oxyCODONE 5 MG immediate release tablet Commonly known as: Oxy IR/ROXICODONE Take 1-2 tablets (5-10 mg total) by mouth every 4 (four) hours as needed for moderate pain.   promethazine-dextromethorphan 6.25-15 MG/5ML syrup Commonly known as: PROMETHAZINE-DM Take 5 mLs by mouth 4 (four) times daily as needed for cough. Do not take with alcohol or while driving operating heavy machinery   Symbicort 80-4.5 MCG/ACT inhaler Generic drug: budesonide-formoterol Inhale 2 puffs into the lungs in the morning and at bedtime.   venlafaxine XR 75 MG 24 hr capsule Commonly known as: EFFEXOR-XR Take 2 capsules (150 mg total) by mouth daily with breakfast.               Discharge Care  Instructions  (From admission, onward)           Start     Ordered   05/12/22 0000  If the dressing is still on your incision site when you go home, remove it on the third day after your surgery date. Remove dressing if it begins to fall off, or if it is dirty  or damaged before the third day.        05/12/22 1206            Follow-up Information     Florian Buff, MD Follow up in 2 week(s).   Specialties: Obstetrics and Gynecology, Radiology Why: post op visit Contact information: [place] [address] [phone]                 Signed: Florian Buff 05/12/2022, 12:07 PM

## 2022-05-12 NOTE — Progress Notes (Signed)
Nsg Discharge Note  Admit Date:  05/11/2022 Discharge date: 05/12/2022   Margarita Grizzle Brogan to be D/C'd Home per MD order.  AVS completed.  Copy for chart, and copy for patient signed, and dated. Patient/caregiver able to verbalize understanding.  Discharge Medication: Allergies as of 05/12/2022       Reactions   Dapagliflozin Other (See Comments)   Yeast infection   Lisinopril Swelling        Medication List     STOP taking these medications    HYDROcodone-acetaminophen 5-325 MG tablet Commonly known as: NORCO/VICODIN   megestrol 40 MG tablet Commonly known as: MEGACE       TAKE these medications    albuterol 108 (90 Base) MCG/ACT inhaler Commonly known as: VENTOLIN HFA Inhale 2 puffs into the lungs every 4 (four) hours as needed for wheezing or shortness of breath.   amLODipine 2.5 MG tablet Commonly known as: NORVASC Take 1 tablet (2.5 mg total) by mouth daily.   amLODipine 5 MG tablet Commonly known as: NORVASC Take 5 mg by mouth daily.   aspirin EC 325 MG tablet Take 1 tablet (325 mg total) by mouth daily.   atorvastatin 20 MG tablet Commonly known as: LIPITOR Take 1 tablet (20 mg total) by mouth daily at 6 PM.   benzonatate 100 MG capsule Commonly known as: TESSALON Take 1 capsule (100 mg total) by mouth 3 (three) times daily as needed for cough. Do not take with alcohol or while driving or operating heavy machinery   clobetasol cream 0.05 % Commonly known as: TEMOVATE Apply 1 application topically daily as needed (eczema).   diclofenac Sodium 1 % Gel Commonly known as: Voltaren Apply 2 g topically 4 (four) times daily.   fluticasone 0.05 % cream Commonly known as: CUTIVATE Apply topically 2 (two) times daily. For one week then daily, gradually taper use What changed:  how much to take when to take this reasons to take this additional instructions   Gerhardt's butt cream Crea Apply 1 Application topically 3 (three) times daily.   ketorolac  10 MG tablet Commonly known as: TORADOL Take 1 tablet (10 mg total) by mouth every 8 (eight) hours as needed. What changed: Another medication with the same name was added. Make sure you understand how and when to take each.   ketorolac 10 MG tablet Commonly known as: TORADOL Take 1 tablet (10 mg total) by mouth every 8 (eight) hours as needed. What changed: You were already taking a medication with the same name, and this prescription was added. Make sure you understand how and when to take each.   metFORMIN 500 MG tablet Commonly known as: GLUCOPHAGE Take 500 mg by mouth 2 (two) times daily.   ondansetron 8 MG tablet Commonly known as: Zofran Take 1 tablet (8 mg total) by mouth every 8 (eight) hours as needed for nausea or vomiting.   oxyCODONE 5 MG immediate release tablet Commonly known as: Oxy IR/ROXICODONE Take 1-2 tablets (5-10 mg total) by mouth every 4 (four) hours as needed for moderate pain.   promethazine-dextromethorphan 6.25-15 MG/5ML syrup Commonly known as: PROMETHAZINE-DM Take 5 mLs by mouth 4 (four) times daily as needed for cough. Do not take with alcohol or while driving operating heavy machinery   Symbicort 80-4.5 MCG/ACT inhaler Generic drug: budesonide-formoterol Inhale 2 puffs into the lungs in the morning and at bedtime.   venlafaxine XR 75 MG 24 hr capsule Commonly known as: EFFEXOR-XR Take 2 capsules (150 mg total) by  mouth daily with breakfast.               Discharge Care Instructions  (From admission, onward)           Start     Ordered   05/12/22 0000  If the dressing is still on your incision site when you go home, remove it on the third day after your surgery date. Remove dressing if it begins to fall off, or if it is dirty or damaged before the third day.        05/12/22 1206            Discharge Assessment: Vitals:   05/12/22 0603 05/12/22 0719  BP: (!) 157/91   Pulse: (!) 110   Resp: 18   Temp: 99.2 F (37.3 C)    SpO2: 100% 100%   Skin clean, dry and intact without evidence of skin break down, no evidence of skin tears noted. IV catheter discontinued intact. Site without signs and symptoms of complications - no redness or edema noted at insertion site, patient denies c/o pain - only slight tenderness at site.  Dressing with slight pressure applied.  D/c Instructions-Education: Discharge instructions given to patient/family with verbalized understanding. D/c education completed with patient/family including follow up instructions, medication list, d/c activities limitations if indicated, with other d/c instructions as indicated by MD - patient able to verbalize understanding, all questions fully answered. Patient instructed to return to ED, call 911, or call MD for any changes in condition.  Patient escorted via [place], and D/C home via private auto.  Loa Socks, RN 05/12/2022 12:59 PM

## 2022-05-13 LAB — SURGICAL PATHOLOGY

## 2022-05-17 ENCOUNTER — Telehealth: Payer: Self-pay | Admitting: Obstetrics & Gynecology

## 2022-05-17 ENCOUNTER — Other Ambulatory Visit: Payer: Self-pay | Admitting: Obstetrics & Gynecology

## 2022-05-17 MED ORDER — KETOROLAC TROMETHAMINE 10 MG PO TABS
10.0000 mg | ORAL_TABLET | Freq: Three times a day (TID) | ORAL | 0 refills | Status: DC | PRN
Start: 2022-05-17 — End: 2022-10-12

## 2022-05-17 MED ORDER — OXYCODONE HCL 5 MG PO TABS: 5.0000 mg | ORAL_TABLET | ORAL | 0 refills | Status: DC | PRN

## 2022-05-17 NOTE — Telephone Encounter (Signed)
Pt is wanting a call back about pain medication.

## 2022-05-17 NOTE — Telephone Encounter (Signed)
Pt had an abd hyst last Tuesday. Pt is taking Oxycodone and Toradol. Is there something else you recommend so she don't run out of pain meds before her appt next week. She had a stroke in 2017. Can she take Tylenol or Ibuprofen in between? Please advise. Thanks! [place]

## 2022-05-25 ENCOUNTER — Encounter: Payer: Self-pay | Admitting: Obstetrics & Gynecology

## 2022-05-25 ENCOUNTER — Telehealth: Payer: Self-pay | Admitting: Obstetrics & Gynecology

## 2022-05-25 ENCOUNTER — Ambulatory Visit (INDEPENDENT_AMBULATORY_CARE_PROVIDER_SITE_OTHER): Payer: PRIVATE HEALTH INSURANCE | Admitting: Obstetrics & Gynecology

## 2022-05-25 VITALS — BP 136/95 | HR 79 | Wt 297.0 lb

## 2022-05-25 DIAGNOSIS — Z90710 Acquired absence of both cervix and uterus: Secondary | ICD-10-CM

## 2022-05-25 DIAGNOSIS — Z9079 Acquired absence of other genital organ(s): Secondary | ICD-10-CM | POA: Diagnosis not present

## 2022-05-25 DIAGNOSIS — Z90722 Acquired absence of ovaries, bilateral: Secondary | ICD-10-CM

## 2022-05-25 LAB — POCT HEMOGLOBIN: Hemoglobin: 8.5 g/dL — AB (ref 11–14.6)

## 2022-05-25 MED ORDER — FERROUS GLUCONATE 324 (38 FE) MG PO TABS: 324.0000 mg | ORAL_TABLET | Freq: Every day | ORAL | 3 refills | Status: AC

## 2022-05-25 NOTE — Telephone Encounter (Signed)
Returned patient's call.  Advised no lifting over 10 pounds until after next post-op visit. Pt verbalized understanding.

## 2022-05-25 NOTE — Progress Notes (Signed)
  HPI: Patient returns for routine postoperative follow-up having undergone abdominal hysterectomy LSO on 05/11/22.  The patient's immediate postoperative recovery has been unremarkable. Since hospital discharge the patient reports doing well.   Current Outpatient Medications: albuterol (VENTOLIN HFA) 108 (90 Base) MCG/ACT inhaler, Inhale 2 puffs into the lungs every 4 (four) hours as needed for wheezing or shortness of breath., Disp: , Rfl:  amLODipine (NORVASC) 5 MG tablet, Take 5 mg by mouth daily., Disp: , Rfl:  aspirin EC 325 MG EC tablet, Take 1 tablet (325 mg total) by mouth daily., Disp: , Rfl:  atorvastatin (LIPITOR) 20 MG tablet, Take 1 tablet (20 mg total) by mouth daily at 6 PM., Disp: 30 tablet, Rfl: 0 clobetasol cream (TEMOVATE) 9.92 %, Apply 1 application topically daily as needed (eczema)., Disp: , Rfl:  fluticasone (CUTIVATE) 0.05 % cream, Apply topically 2 (two) times daily. For one week then daily, gradually taper use (Patient taking differently: Apply 1 application  topically daily as needed (eczema).), Disp: 30 g, Rfl: 0 ketorolac (TORADOL) 10 MG tablet, Take 1 tablet (10 mg total) by mouth every 8 (eight) hours as needed., Disp: 15 tablet, Rfl: 0 metFORMIN (GLUCOPHAGE) 500 MG tablet, Take 500 mg by mouth 2 (two) times daily. , Disp: , Rfl:  ondansetron (ZOFRAN) 8 MG tablet, Take 1 tablet (8 mg total) by mouth every 8 (eight) hours as needed for nausea or vomiting., Disp: 20 tablet, Rfl: 0 oxyCODONE (OXY IR/ROXICODONE) 5 MG immediate release tablet, Take 1-2 tablets (5-10 mg total) by mouth every 4 (four) hours as needed for moderate pain., Disp: 30 tablet, Rfl: 0 SYMBICORT 80-4.5 MCG/ACT inhaler, Inhale 2 puffs into the lungs in the morning and at bedtime., Disp: , Rfl:  venlafaxine XR (EFFEXOR-XR) 75 MG 24 hr capsule, Take 2 capsules (150 mg total) by mouth daily with breakfast., Disp: 60 capsule, Rfl: 12 benzonatate (TESSALON) 100 MG capsule, Take 1 capsule (100 mg total) by  mouth 3 (three) times daily as needed for cough. Do not take with alcohol or while driving or operating heavy machinery (Patient not taking: Reported on 04/29/2022), Disp: 21 capsule, Rfl: 0 diclofenac Sodium (VOLTAREN) 1 % GEL, Apply 2 g topically 4 (four) times daily. (Patient not taking: Reported on 04/29/2022), Disp: 350 g, Rfl: 0 Nystatin (GERHARDT'S BUTT CREAM) CREA, Apply 1 Application topically 3 (three) times daily. (Patient not taking: Reported on 04/29/2022), Disp: 1 each, Rfl: 11 promethazine-dextromethorphan (PROMETHAZINE-DM) 6.25-15 MG/5ML syrup, Take 5 mLs by mouth 4 (four) times daily as needed for cough. Do not take with alcohol or while driving operating heavy machinery (Patient not taking: Reported on 04/29/2022), Disp: 118 mL, Rfl: 0  No current facility-administered medications for this visit.    Blood pressure (!) 136/95, pulse 79, weight 297 lb (134.7 kg).  Physical Exam: Incision clean dry intact Abdomen soft benign  Diagnostic Tests:   Pathology: benign  Impression + Management plan:   ICD-10-CM   1. S/P TAH-LSO  Z90.710    Z90.722    Z90.79          Medications Prescribed this encounter: No orders of the defined types were placed in this encounter.     Follow up: Return in about 4 weeks (around 06/22/2022) for Follow up, with Dr Elonda Husky, Post Op.    Florian Buff, MD Attending Physician for the [facility] [place] [facility] 05/25/2022 3:56 PM

## 2022-05-25 NOTE — Telephone Encounter (Signed)
Patient wanted to know if her weight restrictions had been lifted for lifting. Please advise.

## 2022-05-25 NOTE — Addendum Note (Signed)
Addended by: Octaviano Glow on: 05/25/2022 04:07 PM   Modules accepted: Orders

## 2022-06-18 ENCOUNTER — Ambulatory Visit (INDEPENDENT_AMBULATORY_CARE_PROVIDER_SITE_OTHER): Payer: PRIVATE HEALTH INSURANCE | Admitting: Obstetrics & Gynecology

## 2022-06-18 ENCOUNTER — Encounter: Payer: Self-pay | Admitting: Obstetrics & Gynecology

## 2022-06-18 VITALS — BP 145/98 | HR 89

## 2022-06-18 DIAGNOSIS — Z90722 Acquired absence of ovaries, bilateral: Secondary | ICD-10-CM

## 2022-06-18 DIAGNOSIS — Z90710 Acquired absence of both cervix and uterus: Secondary | ICD-10-CM

## 2022-06-18 DIAGNOSIS — Z9079 Acquired absence of other genital organ(s): Secondary | ICD-10-CM

## 2022-06-18 MED ORDER — ESTRADIOL 0.1 MG/GM VA CREA: TOPICAL_CREAM | VAGINAL | 12 refills | Status: AC

## 2022-06-18 NOTE — Progress Notes (Signed)
  HPI: Patient returns for routine postoperative follow-up having undergone TAH B LSO on 05/11/22.  The patient's immediate postoperative recovery has been unremarkable. Since hospital discharge the patient reports no problems some vaginal atrophy.   Current Outpatient Medications: albuterol (VENTOLIN HFA) 108 (90 Base) MCG/ACT inhaler, Inhale 2 puffs into the lungs every 4 (four) hours as needed for wheezing or shortness of breath., Disp: , Rfl:  amLODipine (NORVASC) 5 MG tablet, Take 5 mg by mouth daily., Disp: , Rfl:  aspirin EC 325 MG EC tablet, Take 1 tablet (325 mg total) by mouth daily., Disp: , Rfl:  atorvastatin (LIPITOR) 20 MG tablet, Take 1 tablet (20 mg total) by mouth daily at 6 PM., Disp: 30 tablet, Rfl: 0 clobetasol cream (TEMOVATE) 9.62 %, Apply 1 application topically daily as needed (eczema)., Disp: , Rfl:  estradiol (ESTRACE) 0.1 MG/GM vaginal cream, 1 gram at bedtime, Disp: 30 g, Rfl: 12 ferrous gluconate (FERGON) 324 MG tablet, Take 1 tablet (324 mg total) by mouth daily with breakfast., Disp: 30 tablet, Rfl: 3 fluticasone (CUTIVATE) 0.05 % cream, Apply topically 2 (two) times daily. For one week then daily, gradually taper use (Patient taking differently: Apply 1 application  topically daily as needed (eczema).), Disp: 30 g, Rfl: 0 ketorolac (TORADOL) 10 MG tablet, Take 1 tablet (10 mg total) by mouth every 8 (eight) hours as needed., Disp: 15 tablet, Rfl: 0 metFORMIN (GLUCOPHAGE) 500 MG tablet, Take 500 mg by mouth 2 (two) times daily. , Disp: , Rfl:  SYMBICORT 80-4.5 MCG/ACT inhaler, Inhale 2 puffs into the lungs in the morning and at bedtime., Disp: , Rfl:  venlafaxine XR (EFFEXOR-XR) 75 MG 24 hr capsule, Take 2 capsules (150 mg total) by mouth daily with breakfast., Disp: 60 capsule, Rfl: 12 diclofenac Sodium (VOLTAREN) 1 % GEL, Apply 2 g topically 4 (four) times daily. (Patient not taking: Reported on 04/29/2022), Disp: 350 g, Rfl: 0 Nystatin (GERHARDT'S BUTT CREAM) CREA,  Apply 1 Application topically 3 (three) times daily. (Patient not taking: Reported on 04/29/2022), Disp: 1 each, Rfl: 11 ondansetron (ZOFRAN) 8 MG tablet, Take 1 tablet (8 mg total) by mouth every 8 (eight) hours as needed for nausea or vomiting. (Patient not taking: Reported on 06/18/2022), Disp: 20 tablet, Rfl: 0 promethazine-dextromethorphan (PROMETHAZINE-DM) 6.25-15 MG/5ML syrup, Take 5 mLs by mouth 4 (four) times daily as needed for cough. Do not take with alcohol or while driving operating heavy machinery (Patient not taking: Reported on 04/29/2022), Disp: 118 mL, Rfl: 0  No current facility-administered medications for this visit.    Blood pressure (!) 145/98, pulse 89, last menstrual period 09/10/2021.  Physical Exam: Normal post op visit  Diagnostic Tests:   Pathology: benign  Impression + Management plan:   ICD-10-CM   1. S/P TAH-LSO  Z90.710    Z90.722    Z90.79          Medications Prescribed this encounter: Orders Placed This Encounter     estradiol (ESTRACE) 0.1 MG/GM vaginal cream         Sig: 1 gram at bedtime         Dispense:  30 g         Refill:  12     Follow up: No follow-ups on file.    Florian Buff, MD Attending Physician for the [facility] [place] 06/18/2022 9:44 AM

## 2022-06-22 ENCOUNTER — Other Ambulatory Visit: Payer: Self-pay | Admitting: Family Medicine

## 2022-06-22 DIAGNOSIS — Z1231 Encounter for screening mammogram for malignant neoplasm of breast: Secondary | ICD-10-CM

## 2022-06-28 ENCOUNTER — Encounter: Payer: Self-pay | Admitting: Obstetrics & Gynecology

## 2022-06-30 ENCOUNTER — Encounter: Payer: Self-pay | Admitting: Obstetrics & Gynecology

## 2022-06-30 ENCOUNTER — Other Ambulatory Visit (INDEPENDENT_AMBULATORY_CARE_PROVIDER_SITE_OTHER): Payer: Self-pay | Admitting: Obstetrics & Gynecology

## 2022-06-30 MED ORDER — ZOLPIDEM TARTRATE 10 MG PO TABS: 10.0000 mg | ORAL_TABLET | Freq: Every evening | ORAL | 0 refills | Status: DC | PRN

## 2022-06-30 MED ORDER — ZOLPIDEM TARTRATE 10 MG PO TABS
10.0000 mg | ORAL_TABLET | Freq: Every evening | ORAL | 0 refills | Status: AC | PRN
Start: 2022-06-30 — End: ?

## 2022-07-13 ENCOUNTER — Ambulatory Visit
Admission: RE | Admit: 2022-07-13 | Discharge: 2022-07-13 | Disposition: A | Discharge status: Home / Self Care | Payer: PRIVATE HEALTH INSURANCE | Source: Ambulatory Visit | Attending: Family Medicine | Admitting: Family Medicine

## 2022-07-13 DIAGNOSIS — Z1231 Encounter for screening mammogram for malignant neoplasm of breast: Secondary | ICD-10-CM

## 2022-07-14 ENCOUNTER — Ambulatory Visit: Payer: PRIVATE HEALTH INSURANCE

## 2022-10-12 ENCOUNTER — Ambulatory Visit (INDEPENDENT_AMBULATORY_CARE_PROVIDER_SITE_OTHER): Payer: PRIVATE HEALTH INSURANCE | Admitting: Obstetrics & Gynecology

## 2022-10-12 ENCOUNTER — Encounter: Payer: Self-pay | Admitting: Obstetrics & Gynecology

## 2022-10-12 VITALS — BP 140/95 | HR 79 | Ht 63.5 in

## 2022-10-12 DIAGNOSIS — B3731 Acute candidiasis of vulva and vagina: Secondary | ICD-10-CM | POA: Diagnosis not present

## 2022-10-12 DIAGNOSIS — G4700 Insomnia, unspecified: Secondary | ICD-10-CM | POA: Diagnosis not present

## 2022-10-12 MED ORDER — TRAZODONE HCL 50 MG PO TABS: 50.0000 mg | ORAL_TABLET | Freq: Every day | ORAL | 2 refills | Status: AC

## 2022-10-12 MED ORDER — FLUCONAZOLE 100 MG PO TABS: 100.0000 mg | ORAL_TABLET | Freq: Every day | ORAL | 0 refills | Status: AC

## 2022-10-12 NOTE — Progress Notes (Signed)
Chief Complaint  Patient presents with   vaginal dryness, itching    Estradiol cream not helping      52 y.o. [id number] Patient's last menstrual period was 09/10/2021. The current method of family planning is status post hysterectomy.  Outpatient Encounter Medications as of 10/12/2022  Medication Sig   albuterol (VENTOLIN HFA) 108 (90 Base) MCG/ACT inhaler Inhale 2 puffs into the lungs every 4 (four) hours as needed for wheezing or shortness of breath.   amLODipine (NORVASC) 5 MG tablet Take 5 mg by mouth daily.   aspirin EC 325 MG EC tablet Take 1 tablet (325 mg total) by mouth daily.   atorvastatin (LIPITOR) 20 MG tablet Take 1 tablet (20 mg total) by mouth daily at 6 PM.   clobetasol cream (TEMOVATE) 1.44 % Apply 1 application topically daily as needed (eczema).   estradiol (ESTRACE) 0.1 MG/GM vaginal cream 1 gram at bedtime   ferrous gluconate (FERGON) 324 MG tablet Take 1 tablet (324 mg total) by mouth daily with breakfast.   fluconazole (DIFLUCAN) 100 MG tablet Take 1 tablet (100 mg total) by mouth daily.   fluticasone (CUTIVATE) 0.05 % cream Apply topically 2 (two) times daily. For one week then daily, gradually taper use (Patient taking differently: Apply 1 application  topically daily as needed (eczema).)   metFORMIN (GLUCOPHAGE) 500 MG tablet Take 500 mg by mouth 2 (two) times daily.    ondansetron (ZOFRAN) 8 MG tablet Take 1 tablet (8 mg total) by mouth every 8 (eight) hours as needed for nausea or vomiting.   Semaglutide,0.25 or 0.'5MG'$ /DOS, (OZEMPIC, 0.25 OR 0.5 MG/DOSE,) 2 MG/1.5ML SOPN Inject into the skin.   SYMBICORT 80-4.5 MCG/ACT inhaler Inhale 2 puffs into the lungs in the morning and at bedtime.   traZODone (DESYREL) 50 MG tablet Take 1 tablet (50 mg total) by mouth at bedtime.   venlafaxine XR (EFFEXOR-XR) 75 MG 24 hr capsule Take 2 capsules (150 mg total) by mouth daily with breakfast.   zolpidem (AMBIEN) 10 MG tablet Take 1 tablet (10 mg total) by mouth at  bedtime as needed for sleep. (Patient not taking: Reported on 10/12/2022)   [DISCONTINUED] diclofenac Sodium (VOLTAREN) 1 % GEL Apply 2 g topically 4 (four) times daily. (Patient not taking: Reported on 04/29/2022)   [DISCONTINUED] ketorolac (TORADOL) 10 MG tablet Take 1 tablet (10 mg total) by mouth every 8 (eight) hours as needed.   [DISCONTINUED] Nystatin (GERHARDT'S BUTT CREAM) CREA Apply 1 Application topically 3 (three) times daily. (Patient not taking: Reported on 04/29/2022)   [DISCONTINUED] promethazine-dextromethorphan (PROMETHAZINE-DM) 6.25-15 MG/5ML syrup Take 5 mLs by mouth 4 (four) times daily as needed for cough. Do not take with alcohol or while driving operating heavy machinery (Patient not taking: Reported on 04/29/2022)   No facility-administered encounter medications on file as of 10/12/2022.    Subjective Last seen 06/18/22  Complaining of intense itching especially nighttime Was having some issues before, much worse now Rx'd Fanny Cream but did not get ?reason Has used mutilple OTC yeast preps without success  Also complaining of insomnia chronic  Past Medical History:  Diagnosis Date   Anxiety    Arthritis    Asthma    Bilateral ovarian cysts    Depression    Diabetes mellitus without complication (HCC)    Epidermoid cyst of finger    tip of left little finger   Fibroids    GERD (gastroesophageal reflux disease)    Hyperlipidemia    Hypertension  Insomnia    LVH (left ventricular hypertrophy)    Moderate   Obesity    Pain of right heel    Sleep apnea    History of no problems since weight loss   Stroke ([place]) 07/2016   No residual   Substance abuse ([place])    cocaine, marijuana and alcohol abuse. quit end of 2012    Past Surgical History:  Procedure Laterality Date   ABDOMINAL HYSTERECTOMY N/A 05/11/2022   Procedure: HYSTERECTOMY ABDOMINAL;  Surgeon: Florian Buff, MD;  [facility]: [facility];  Service: Gynecology;  Laterality: N/A;   BILATERAL SALPINGECTOMY  Left 05/11/2022   Procedure: OPEN LEFT SALPINGECTOMY AND OOPHORECTOMY;  Surgeon: Florian Buff, MD;  Location: [facility];  Service: Gynecology;  Laterality: Left;   CHOLECYSTECTOMY     COLONOSCOPY WITH PROPOFOL N/A 08/31/2021   Procedure: COLONOSCOPY WITH PROPOFOL;  Surgeon: Eloise Harman, DO;  [facility];  Service: Endoscopy;  Laterality: N/A;  10:00am   HEEL SPUR SURGERY Bilateral    KNEE SURGERY     LAPAROTOMY WITH STAGING N/A 02/14/2018   Procedure: MINI-LAPAROTOMY;  Surgeon: Everitt Amber, MD;  [facility]: [facility];  Service: Gynecology;  Laterality: N/A;   LYSIS OF ADHESION  02/14/2018   Procedure: LYSIS OF ADHESION;  Surgeon: Everitt Amber, MD;  [facility];  Service: Gynecology;;   POLYPECTOMY  08/31/2021   Procedure: POLYPECTOMY;  Surgeon: Eloise Harman, DO;  [facility];  Service: Endoscopy;;   ROBOTIC ASSISTED TOTAL HYSTERECTOMY WITH BILATERAL SALPINGO OOPHERECTOMY N/A 02/14/2018   Procedure: XI ROBOTIC ASSISTED RIGHT SALPINGO OOPHORECTOMY, OMENTECTOMY;  Surgeon: Everitt Amber, MD;  [facility];  Service: Gynecology;  Laterality: N/A;   TUBAL LIGATION      OB History     Gravida  5   Para  3   Term  3   Preterm      AB  2   Living  3      SAB      IAB  2   Ectopic      Multiple      Live Births              Allergies  Allergen Reactions   Dapagliflozin Other (See Comments)    Yeast infection   Lisinopril Swelling    Social History   Socioeconomic History   Marital status: Married    Spouse name: Not on file   Number of children: 3   Years of education: Not on file   Highest education level: Not on file  Occupational History   Occupation: event specialist  Tobacco Use   Smoking status: Former    Packs/day: 0.25    Years: 15.00    Total pack years: 3.75    Types: Cigarettes    Quit date: 03/11/2014    Years since quitting: 8.5   Smokeless tobacco: Never  Vaping Use   Vaping Use: Never used  Substance and  Sexual Activity   Alcohol use: Not Currently   Drug use: Not Currently    Types: Marijuana    Comment: Last use 08/31/21 history of substance abuse; last marijuana use about 1 week ago   Sexual activity: Not Currently    Birth control/protection: Surgical, Abstinence    Comment: hyst  Other Topics Concern   Not on file  Social History Narrative   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   Social Determinants of Health   Financial Resource Strain:  Low Risk  (05/02/2020)   Overall Financial Resource Strain (CARDIA)    Difficulty of Paying Living Expenses: Not very hard  Food Insecurity: Food Insecurity Present (05/02/2020)   Hunger Vital Sign    Worried About Running Out of Food in the Last Year: Often true    Ran Out of Food in the Last Year: Never true  Transportation Needs: No Transportation Needs (05/02/2020)   PRAPARE - Hydrologist (Medical): No    Lack of Transportation (Non-Medical): No  Physical Activity: Insufficiently Active (05/02/2020)   Exercise Vital Sign    Days of Exercise per Week: 3 days    Minutes of Exercise per Session: 30 min  Stress: Stress Concern Present (05/02/2020)   [place]    Feeling of Stress : To some extent  Social Connections: Socially Integrated (05/02/2020)   Social Connection and Isolation Panel [NHANES]    Frequency of Communication with Friends and Family: More than three times a week    Frequency of Social Gatherings with Friends and Family: Once a week    Attends Religious Services: 1 to 4 times per year    Active Member of [employer] or Organizations: Yes    Attends Archivist Meetings: 1 to 4 times per year    Marital Status: Married    Family History  Problem Relation Age of Onset   Diabetes Paternal Grandmother    Diabetes Maternal Grandmother    CVA Maternal Grandmother 66   Heart disease Father 53        heart failure   Diabetes Father    Heart failure Father    Hypertension Mother    Cancer Mother 29       colon   Kidney failure Sister    CVA Maternal Aunt 38       Aneurysm    Medications:       Current Outpatient Medications:    albuterol (VENTOLIN HFA) 108 (90 Base) MCG/ACT inhaler, Inhale 2 puffs into the lungs every 4 (four) hours as needed for wheezing or shortness of breath., Disp: , Rfl:    amLODipine (NORVASC) 5 MG tablet, Take 5 mg by mouth daily., Disp: , Rfl:    aspirin EC 325 MG EC tablet, Take 1 tablet (325 mg total) by mouth daily., Disp: , Rfl:    atorvastatin (LIPITOR) 20 MG tablet, Take 1 tablet (20 mg total) by mouth daily at 6 PM., Disp: 30 tablet, Rfl: 0   clobetasol cream (TEMOVATE) 8.24 %, Apply 1 application topically daily as needed (eczema)., Disp: , Rfl:    estradiol (ESTRACE) 0.1 MG/GM vaginal cream, 1 gram at bedtime, Disp: 30 g, Rfl: 12   ferrous gluconate (FERGON) 324 MG tablet, Take 1 tablet (324 mg total) by mouth daily with breakfast., Disp: 30 tablet, Rfl: 3   fluconazole (DIFLUCAN) 100 MG tablet, Take 1 tablet (100 mg total) by mouth daily., Disp: 30 tablet, Rfl: 0   fluticasone (CUTIVATE) 0.05 % cream, Apply topically 2 (two) times daily. For one week then daily, gradually taper use (Patient taking differently: Apply 1 application  topically daily as needed (eczema).), Disp: 30 g, Rfl: 0   metFORMIN (GLUCOPHAGE) 500 MG tablet, Take 500 mg by mouth 2 (two) times daily. , Disp: , Rfl:    ondansetron (ZOFRAN) 8 MG tablet, Take 1 tablet (8 mg total) by mouth every 8 (eight) hours as needed for nausea or vomiting., Disp: 20  tablet, Rfl: 0   Semaglutide,0.25 or 0.'5MG'$ /DOS, (OZEMPIC, 0.25 OR 0.5 MG/DOSE,) 2 MG/1.5ML SOPN, Inject into the skin., Disp: , Rfl:    SYMBICORT 80-4.5 MCG/ACT inhaler, Inhale 2 puffs into the lungs in the morning and at bedtime., Disp: , Rfl:    traZODone (DESYREL) 50 MG tablet, Take 1 tablet (50 mg total) by mouth at bedtime., Disp: 30  tablet, Rfl: 2   venlafaxine XR (EFFEXOR-XR) 75 MG 24 hr capsule, Take 2 capsules (150 mg total) by mouth daily with breakfast., Disp: 60 capsule, Rfl: 12   zolpidem (AMBIEN) 10 MG tablet, Take 1 tablet (10 mg total) by mouth at bedtime as needed for sleep. (Patient not taking: Reported on 10/12/2022), Disp: 30 tablet, Rfl: 0  Objective Blood pressure (!) 140/95, pulse 79, height 5' 3.5" (1.613 m), last menstrual period 09/10/2021.  Severe vulvar candidiasis, consistent with diabetic state, although improved on ozempic Minimal vaginal yeast Good vaginal estrogenic effect  Painted with gentian violet  Pertinent ROS No burning with urination, frequency or urgency No nausea, vomiting or diarrhea Nor fever chills or other constitutional symptoms   Labs or studies     Impression + Management Plan: Diagnoses this Encounter::   ICD-10-CM   1. Vulval candidiasis  B37.31    Painted with gentian violet, Rx: fanny cream + daily diflucan 100 mg    2. Insomnia, unspecified type  G47.00    switch to trazadone, patient has been on that before with success        Medications prescribed during  this encounter: Meds ordered this encounter  Medications   fluconazole (DIFLUCAN) 100 MG tablet    Sig: Take 1 tablet (100 mg total) by mouth daily.    Dispense:  30 tablet    Refill:  0   traZODone (DESYREL) 50 MG tablet    Sig: Take 1 tablet (50 mg total) by mouth at bedtime.    Dispense:  30 tablet    Refill:  2    Labs or Scans Ordered during this encounter: No orders of the defined types were placed in this encounter.     Follow up Return in about 2 weeks (around 10/26/2022) for Follow up, with Dr Elonda Husky.

## 2022-10-26 ENCOUNTER — Ambulatory Visit: Payer: PRIVATE HEALTH INSURANCE | Admitting: Obstetrics & Gynecology

## 2022-11-02 ENCOUNTER — Encounter: Payer: Self-pay | Admitting: Obstetrics & Gynecology

## 2022-11-02 ENCOUNTER — Ambulatory Visit (INDEPENDENT_AMBULATORY_CARE_PROVIDER_SITE_OTHER): Payer: PRIVATE HEALTH INSURANCE | Admitting: Obstetrics & Gynecology

## 2022-11-02 VITALS — BP 123/82 | HR 65 | Ht 63.0 in | Wt 272.8 lb

## 2022-11-02 DIAGNOSIS — B3731 Acute candidiasis of vulva and vagina: Secondary | ICD-10-CM

## 2022-11-02 NOTE — Progress Notes (Signed)
Follow up appointment for response: Medication response  Chief Complaint  Patient presents with   Follow-up    Vulval candidiasis    Blood pressure 123/82, pulse 65, height '5\' 3"'$  (1.6 m), weight 272 lb 12.8 oz (123.7 kg), last menstrual period 09/10/2021.    Significantly improved symptoms and exam Re painted today, finish diflucan Begin estradiol cream (previously using) Stop using pads  MEDS ordered this encounter: No orders of the defined types were placed in this encounter.   Orders for this encounter: No orders of the defined types were placed in this encounter.   Impression + Management Plan   ICD-10-CM   1. Vulval candidiasis  B37.31    dramatically improved objectively and subjectively, painted with GV      Follow Up: Return if symptoms worsen or fail to improve.     All questions were answered.  Past Medical History:  Diagnosis Date   Anxiety    Arthritis    Asthma    Bilateral ovarian cysts    Depression    Diabetes mellitus without complication ([place])    Epidermoid cyst of finger    tip of left little finger   Fibroids    GERD (gastroesophageal reflux disease)    Hyperlipidemia    Hypertension    Insomnia    LVH (left ventricular hypertrophy)    Moderate   Obesity    Pain of right heel    Sleep apnea    History of no problems since weight loss   Stroke ([place]) 07/2016   No residual   Substance abuse ([place])    cocaine, marijuana and alcohol abuse. quit end of 2012    Past Surgical History:  Procedure Laterality Date   ABDOMINAL HYSTERECTOMY N/A 05/11/2022   Procedure: HYSTERECTOMY ABDOMINAL;  Surgeon: Florian Buff, MD;  [facility]: [facility];  Service: Gynecology;  Laterality: N/A;   BILATERAL SALPINGECTOMY Left 05/11/2022   Procedure: OPEN LEFT SALPINGECTOMY AND OOPHORECTOMY;  Surgeon: Florian Buff, MD;  Location: [facility];  Service: Gynecology;  Laterality: Left;   CHOLECYSTECTOMY     COLONOSCOPY WITH PROPOFOL N/A 08/31/2021   Procedure:  COLONOSCOPY WITH PROPOFOL;  Surgeon: Eloise Harman, DO;  [facility];  Service: Endoscopy;  Laterality: N/A;  10:00am   HEEL SPUR SURGERY Bilateral    KNEE SURGERY     LAPAROTOMY WITH STAGING N/A 02/14/2018   Procedure: MINI-LAPAROTOMY;  Surgeon: Everitt Amber, MD;  [facility];  Service: Gynecology;  Laterality: N/A;   LYSIS OF ADHESION  02/14/2018   Procedure: LYSIS OF ADHESION;  Surgeon: Everitt Amber, MD;  [facility];  Service: Gynecology;;   POLYPECTOMY  08/31/2021   Procedure: POLYPECTOMY;  Surgeon: Eloise Harman, DO;  [facility];  Service: Endoscopy;;   ROBOTIC ASSISTED TOTAL HYSTERECTOMY WITH BILATERAL SALPINGO OOPHERECTOMY N/A 02/14/2018   Procedure: XI ROBOTIC ASSISTED RIGHT SALPINGO OOPHORECTOMY, OMENTECTOMY;  Surgeon: Everitt Amber, MD;  [facility];  Service: Gynecology;  Laterality: N/A;   TUBAL LIGATION      OB History     Gravida  5   Para  3   Term  3   Preterm      AB  2   Living  3      SAB      IAB  2   Ectopic      Multiple      Live Births  Allergies  Allergen Reactions   Dapagliflozin Other (See Comments)    Yeast infection   Lisinopril Swelling    Social History   Socioeconomic History   Marital status: Married    Spouse name: Not on file   Number of children: 3   Years of education: Not on file   Highest education level: Not on file  Occupational History   Occupation: event specialist  Tobacco Use   Smoking status: Former    Packs/day: 0.25    Years: 15.00    Total pack years: 3.75    Types: Cigarettes    Quit date: 03/11/2014    Years since quitting: 8.6   Smokeless tobacco: Never  Vaping Use   Vaping Use: Never used  Substance and Sexual Activity   Alcohol use: Not Currently   Drug use: Not Currently    Types: Marijuana    Comment: Last use 08/31/21 history of substance abuse; last marijuana use about 1 week ago   Sexual activity: Not Currently    Birth  control/protection: Surgical, Abstinence    Comment: hyst  Other Topics Concern   Not on file  Social History Narrative   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   Social Determinants of Health   Financial Resource Strain: [place]  (05/02/2020)   Overall Financial Resource Strain (CARDIA)    Difficulty of Paying Living Expenses: Not very hard  Food Insecurity: Food Insecurity Present (05/02/2020)   Hunger Vital Sign    Worried About Running Out of Food in the Last Year: Often true    Ran Out of Food in the Last Year: Never true  Transportation Needs: No Transportation Needs (05/02/2020)   PRAPARE - Hydrologist (Medical): No    Lack of Transportation (Non-Medical): No  Physical Activity: Insufficiently Active (05/02/2020)   Exercise Vital Sign    Days of Exercise per Week: 3 days    Minutes of Exercise per Session: 30 min  Stress: Stress Concern Present (05/02/2020)   [place]    Feeling of Stress : To some extent  Social Connections: Socially Integrated (05/02/2020)   Social Connection and Isolation Panel [NHANES]    Frequency of Communication with Friends and Family: More than three times a week    Frequency of Social Gatherings with Friends and Family: Once a week    Attends Religious Services: 1 to 4 times per year    Active Member of [employer] or Organizations: Yes    Attends Archivist Meetings: 1 to 4 times per year    Marital Status: Married    Family History  Problem Relation Age of Onset   Diabetes Paternal Grandmother    Diabetes Maternal Grandmother    CVA Maternal Grandmother 23   Heart disease Father 40       heart failure   Diabetes Father    Heart failure Father    Hypertension Mother    Cancer Mother 34       colon   Kidney failure Sister    CVA Maternal Aunt 33       Aneurysm

## 2022-11-24 ENCOUNTER — Telehealth (HOSPITAL_COMMUNITY): Payer: Self-pay | Admitting: Psychiatry

## 2022-11-24 ENCOUNTER — Ambulatory Visit (HOSPITAL_COMMUNITY)
Admission: EM | Admit: 2022-11-24 | Discharge: 2022-11-24 | Disposition: A | Discharge status: Home / Self Care | Payer: PRIVATE HEALTH INSURANCE

## 2022-11-24 DIAGNOSIS — F4323 Adjustment disorder with mixed anxiety and depressed mood: Secondary | ICD-10-CM | POA: Diagnosis not present

## 2022-11-24 NOTE — Progress Notes (Signed)
   11/24/22 1318  [place] (Walk-ins at [facility] only)  What Is the Reason for Your Visit/Call Today? Patient presents to the [facility], referred by her PCP. Patient states that she is on FMLA from her job afet having "a meltdown at work."  Patient states that she has been working in a hostile work environment.  Patient states that employees there are not getting raises, more work is added to their workload and she states that she is being bullied at work.  She states that on Friday, she was accused of calling a coworker a pedofile.  She states that she was cleared of this, but states that she now dreading going to work.  She states that she her supervisor brought the issue up again and she states that she started having a panic attack, she was not feeling well and felt like her mental health was at risk. Patient states that she requested to leave work, but initially was denied the ability to leave work, but her supervisor eventually returned and told her maybe she needed to take time off.  Patient states that she has been very anxious since then and states that she has been experiencing hives and other skin issues with itchng since these incidences.  Patient states that she is not experiencing suicidal/homicidal thoughts and denies any psychosis. She states that she is not sleeping well and states that she has experienced a decreased appetite. Patient denies any drug or alcohol use.  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 Patient states that she needs to get into therapy.  Patient is routine.  How Long Has This Been Causing You Problems? 1 wk - 1 month  Have You Recently Had Any Thoughts About Hurting Yourself? No  Are You Planning to Commit Suicide/Harm Yourself At This time? No  Have you Recently Had Thoughts About [place]? No  Are You Planning To Harm Someone At This Time? No  Are you currently experiencing any auditory, visual or other hallucinations? No   Have You Used Any Alcohol or Drugs in the Past 24 Hours? No  Do you have any current medical co-morbidities that require immediate attention? No  Clinician description of patient physical appearance/behavior: casually dressed, anxious, cooperative  What Do You Feel Would Help You the Most Today? Treatment for Depression or other mood problem  If access to [facility] was not available, would you have sought care in the Emergency Department? No  Determination of Need Routine (7 days)  Options For Referral Outpatient Therapy;Medication Management

## 2022-11-24 NOTE — Discharge Instructions (Addendum)
Patient is instructed prior to discharge to:  Take all medications as prescribed by his/her mental healthcare provider. Report any adverse effects and or reactions from the medicines to his/her outpatient provider promptly. Keep all scheduled appointments, to ensure that you are getting refills on time and to avoid any interruption in your medication.  If you are unable to keep an appointment call to reschedule.  Be sure to follow-up with resources and follow-up appointments provided.  Patient has been instructed & cautioned: To not engage in alcohol and or illegal drug use while on prescription medicines. In the event of worsening symptoms, patient is instructed to call the crisis hotline, 911 and or go to the nearest ED for appropriate evaluation and treatment of symptoms. To follow-up with his/her primary care provider for your other medical issues, concerns and or health care needs.  Information: -National Suicide Prevention Lifeline 1-800-SUICIDE or 516 538 8178.  -988 offers 24/7 access to trained crisis counselors who can help people experiencing mental health-related distress. People can call or text 988 or chat 988lifeline.org for themselves or if they are worried about a loved one who may need crisis support.

## 2022-11-24 NOTE — ED Provider Notes (Signed)
Behavioral Health Urgent Care Medical Screening Exam  Patient Name: Joanna Newman MRN: [id number] Date of Evaluation: 11/24/22 Chief Complaint:   Diagnosis:  Final diagnoses:  Adjustment disorder with mixed anxiety and depressed mood    History of Present illness: Joanna Newman is a 51 y.o. female. Patient presents voluntarily to [facility] behavioral health for walk-in assessment.  Patient encouraged to seek assessment by primary care provider, met with PCP earlier this date.  Patient is assessed, face-to-face, by nurse practitioner. She is seated in assessment area, no acute distress. Consulted with provider, Dr.  Dwyane Dee, and chart reviewed on 11/24/2022. She  is alert and oriented, pleasant and cooperative during assessment.   Patient states "I need a therapist because I need somebody that I can talk to and I want to set goals and make decisions about work."  She is also reaching out to employer [employer] for individual counseling options. Patient reports recent stressors include her employer and a difficult relationship with coworkers.  She recently completed an annual evaluation, she requested "pay for my experience and education."  She did not receive additional compensation, reports employer has "added more work."  She reports her workplace is "toxic" and she has a challenging relationship with her coworkers.  She reports feeling anxious and is unable to force herself to drive to work.  Workplace challenges began approximately 5 months ago.  She has been seen by therapy approximately 20 years ago.  Patient  presents with depressed and anxious mood, congruent affect.  Patient reports increased symptoms including tearful episodes, difficulty with focus and racing thoughts, worsening since Friday.  She  denies suicidal and homicidal ideations.  Patient endorses history of 2 previous suicide attempts.  Most recent suicide attempt at age 4 when she ingested an intentional overdose.  Denies  history of nonsuicidal self-harm.  Patient easily  contracts verbally for safety with this Probation officer.  Joanna Newman is not currently linked with outpatient psychiatry.  She endorses history of anxiety and distant history of substance abuse.  She has been prescribed medications to address anxiety by primary care provider, Dr. Alroy Dust.  Current medications include trazodone and venlafaxine.  Venlafaxine has been increased to 150 mg daily recently.  She denies history of inpatient psychiatric hospitalization.  Family mental health history includes "my mother had a nervous breakdown."    Patient has normal speech and behavior.  She  denies auditory and visual hallucinations.  Patient is able to converse coherently with goal-directed thoughts and no distractibility or preoccupation.  Denies symptoms of paranoia.  Objectively there is no evidence of psychosis/mania or delusional thinking.  Joanna Newman resides in [place] with her husband.  She denies access to weapons.  She is employed in [employer].  Patient endorses decreased sleep and appetite.  No recent alcohol or substance use reported.  Patient offered support and encouragement.  She declines any person to contact for collateral information at this time.   Patient educated and verbalizes understanding of mental health resources and other crisis services in the community. They are instructed to call 911 and present to the nearest emergency room should patient experience any suicidal/homicidal ideation, auditory/visual/hallucinations, or detrimental worsening of mental health condition.      [place] ED from 11/24/2022 in [facility] Admission (Discharged) from 05/11/2022 in [place] ED from 04/14/2022 in [facility] at [place] No Risk No Risk No Risk  Psychiatric Specialty Exam  Presentation  General Appearance:Appropriate for Environment;  Casual  Eye Contact:Good  Speech:Clear and Coherent; Normal Rate  Speech Volume:Normal  Handedness:Right   Mood and Affect  Mood: Anxious; Depressed  Affect: Congruent   Thought Process  Thought Processes: Coherent; Goal Directed; Linear  Descriptions of Associations:Intact  Orientation:Full (Time, Place and Person)  Thought Content:Logical; WDL    Hallucinations:None  Ideas of Reference:None  Suicidal Thoughts:No  Homicidal Thoughts:No   Sensorium  Memory: Immediate Good; Recent Good  Judgment: Fair  Insight: Fair   Community education officer  Concentration: Good  Attention Span: Good  Recall: Good  Fund of Knowledge: Good  Language: Good   Psychomotor Activity  Psychomotor Activity: Normal   Assets  Assets: Communication Skills; Desire for Improvement; Housing; Catering manager; Intimacy; Leisure Time; Physical Health; Resilience; Social Support   Sleep  Sleep: Fair  Number of hours: No data recorded  Physical Exam: Physical Exam Vitals and nursing note reviewed.  Constitutional:      Appearance: Normal appearance. She is well-developed.  HENT:     Head: Normocephalic and atraumatic.     Nose: Nose normal.  Cardiovascular:     Rate and Rhythm: Normal rate.  Pulmonary:     Effort: Pulmonary effort is normal.  Musculoskeletal:        General: Normal range of motion.     Cervical back: Normal range of motion.  Neurological:     Mental Status: She is alert and oriented to person, place, and time.  Psychiatric:        Attention and Perception: Attention and perception normal.        Mood and Affect: Affect normal. Mood is anxious and depressed.        Speech: Speech normal.        Behavior: Behavior normal. Behavior is cooperative.        Thought Content: Thought content normal.        Cognition and Memory: Cognition and memory normal.        Judgment: Judgment normal.    Review of Systems  Constitutional:  Negative.   HENT: Negative.    Eyes: Negative.   Respiratory: Negative.    Cardiovascular: Negative.   Gastrointestinal: Negative.   Genitourinary: Negative.   Musculoskeletal: Negative.   Skin: Negative.   Neurological: Negative.   Psychiatric/Behavioral:  Positive for depression.    Blood pressure (!) 135/94, pulse 88, temperature 97.6 F (36.4 C), temperature source Oral, resp. rate 17, last menstrual period 09/10/2021, SpO2 100 %. There is no height or weight on file to calculate BMI.  Musculoskeletal: Strength & Muscle Tone: within normal limits Gait & Station: normal Patient leans: N/A   [place] [facility] Discharge Disposition for Follow up and Recommendations: Based on my evaluation the patient does not appear to have an emergency medical condition and can be discharged with resources and follow up care in outpatient services for Medication Management, Partial Hospitalization Program, and Individual Therapy Follow-up with primary care provider.  Continue current medications. Follow-up with outpatient psychiatry resources provided.  A referral has been initiated on your behalf for intensive outpatient counseling program [facility].   Lucky Rathke, FNP 11/24/2022, 2:09 PM

## 2022-11-24 NOTE — Telephone Encounter (Signed)
D:  Joanna Stallion, FNP referred pt to [facility]-IOP.  A:  Oriented pt.  Encouraged pt to verify her insurance benefits.  Pt is scheduled for a CCA on 11-29-22 @ 1:30 pm; scheduled to start [name] on 12-01-22 @ 9 a.m.Joanna Newman.  R:  Pt receptive.

## 2022-11-29 ENCOUNTER — Telehealth (HOSPITAL_COMMUNITY): Payer: Self-pay | Admitting: Psychiatry

## 2022-11-29 ENCOUNTER — Other Ambulatory Visit (HOSPITAL_COMMUNITY): Payer: PRIVATE HEALTH INSURANCE | Admitting: Psychiatry

## 2022-11-29 NOTE — Telephone Encounter (Signed)
D:  Pt had a 1330 CCA scheduled today.  Pt arrive via phone; states phone was new and she couldn't get the video to work.  According to pt, she is interested more in individual counseling and seeing a psychiatrist instead of attending [facility].  A:  Provided pt with [facility] phone # and provided pt with [facility] phone # since pt inquired about evening/weekend groups.  R: Pt receptive.

## 2022-12-01 ENCOUNTER — Ambulatory Visit (HOSPITAL_COMMUNITY): Payer: PRIVATE HEALTH INSURANCE | Admitting: Licensed Clinical Social Worker

## 2022-12-02 ENCOUNTER — Ambulatory Visit (HOSPITAL_COMMUNITY): Payer: PRIVATE HEALTH INSURANCE | Admitting: Licensed Clinical Social Worker

## 2022-12-03 ENCOUNTER — Ambulatory Visit (HOSPITAL_COMMUNITY): Payer: PRIVATE HEALTH INSURANCE | Admitting: Licensed Clinical Social Worker

## 2022-12-06 ENCOUNTER — Ambulatory Visit (HOSPITAL_COMMUNITY): Payer: PRIVATE HEALTH INSURANCE

## 2022-12-07 ENCOUNTER — Ambulatory Visit (HOSPITAL_COMMUNITY): Payer: PRIVATE HEALTH INSURANCE

## 2022-12-08 ENCOUNTER — Ambulatory Visit (HOSPITAL_COMMUNITY): Payer: PRIVATE HEALTH INSURANCE

## 2022-12-09 ENCOUNTER — Ambulatory Visit (HOSPITAL_COMMUNITY): Payer: PRIVATE HEALTH INSURANCE

## 2022-12-10 ENCOUNTER — Ambulatory Visit (HOSPITAL_COMMUNITY): Payer: PRIVATE HEALTH INSURANCE

## 2022-12-13 ENCOUNTER — Ambulatory Visit (HOSPITAL_COMMUNITY): Payer: PRIVATE HEALTH INSURANCE

## 2022-12-14 ENCOUNTER — Ambulatory Visit (HOSPITAL_COMMUNITY): Payer: PRIVATE HEALTH INSURANCE

## 2022-12-15 ENCOUNTER — Ambulatory Visit (HOSPITAL_COMMUNITY): Payer: PRIVATE HEALTH INSURANCE | Admitting: Licensed Clinical Social Worker

## 2022-12-16 ENCOUNTER — Ambulatory Visit (HOSPITAL_COMMUNITY): Payer: PRIVATE HEALTH INSURANCE | Admitting: Licensed Clinical Social Worker

## 2022-12-17 ENCOUNTER — Ambulatory Visit (HOSPITAL_COMMUNITY): Payer: PRIVATE HEALTH INSURANCE | Admitting: Licensed Clinical Social Worker

## 2023-01-29 ENCOUNTER — Encounter (HOSPITAL_COMMUNITY): Payer: Self-pay | Admitting: Emergency Medicine

## 2023-01-29 ENCOUNTER — Emergency Department (HOSPITAL_COMMUNITY): Payer: Self-pay | Admitting: Anesthesiology

## 2023-01-29 ENCOUNTER — Encounter (HOSPITAL_COMMUNITY)
Admission: EM | Disposition: A | Discharge status: Home / Self Care | Payer: Self-pay | Source: Home / Self Care | Attending: Emergency Medicine

## 2023-01-29 ENCOUNTER — Emergency Department (HOSPITAL_COMMUNITY): Payer: Self-pay

## 2023-01-29 ENCOUNTER — Other Ambulatory Visit: Payer: Self-pay

## 2023-01-29 ENCOUNTER — Emergency Department (HOSPITAL_COMMUNITY)
Admission: EM | Admit: 2023-01-29 | Discharge: 2023-01-29 | Disposition: A | Discharge status: Home / Self Care | Payer: PRIVATE HEALTH INSURANCE | Attending: Emergency Medicine | Admitting: Emergency Medicine

## 2023-01-29 DIAGNOSIS — Z79899 Other long term (current) drug therapy: Secondary | ICD-10-CM | POA: Insufficient documentation

## 2023-01-29 DIAGNOSIS — K353 Acute appendicitis with localized peritonitis, without perforation or gangrene: Secondary | ICD-10-CM

## 2023-01-29 DIAGNOSIS — Z7982 Long term (current) use of aspirin: Secondary | ICD-10-CM | POA: Insufficient documentation

## 2023-01-29 HISTORY — PX: XI ROBOTIC LAPAROSCOPIC ASSISTED APPENDECTOMY: SHX6877

## 2023-01-29 LAB — URINALYSIS, W/ REFLEX TO CULTURE (INFECTION SUSPECTED)
Bacteria, UA: NONE SEEN
Bilirubin Urine: NEGATIVE
Glucose, UA: NEGATIVE mg/dL
Hgb urine dipstick: NEGATIVE
Ketones, ur: 5 mg/dL — AB
Leukocytes,Ua: NEGATIVE
Nitrite: NEGATIVE
Protein, ur: NEGATIVE mg/dL
Specific Gravity, Urine: 1.011 (ref 1.005–1.030)
pH: 8 (ref 5.0–8.0)

## 2023-01-29 LAB — CBC WITH DIFFERENTIAL/PLATELET
Abs Immature Granulocytes: 0.07 10*3/uL (ref 0.00–0.07)
Basophils Absolute: 0.1 10*3/uL (ref 0.0–0.1)
Basophils Relative: 1 %
Eosinophils Absolute: 0.1 10*3/uL (ref 0.0–0.5)
Eosinophils Relative: 0 %
HCT: 38.3 % (ref 36.0–46.0)
Hemoglobin: 12.5 g/dL (ref 12.0–15.0)
Immature Granulocytes: 1 %
Lymphocytes Relative: 19 %
Lymphs Abs: 2.5 10*3/uL (ref 0.7–4.0)
MCH: 27.3 pg (ref 26.0–34.0)
MCHC: 32.6 g/dL (ref 30.0–36.0)
MCV: 83.6 fL (ref 80.0–100.0)
Monocytes Absolute: 0.5 10*3/uL (ref 0.1–1.0)
Monocytes Relative: 4 %
Neutro Abs: 9.6 10*3/uL — ABNORMAL HIGH (ref 1.7–7.7)
Neutrophils Relative %: 75 %
Platelets: 395 10*3/uL (ref 150–400)
RBC: 4.58 MIL/uL (ref 3.87–5.11)
RDW: 15.5 % (ref 11.5–15.5)
WBC: 12.9 10*3/uL — ABNORMAL HIGH (ref 4.0–10.5)
nRBC: 0 % (ref 0.0–0.2)

## 2023-01-29 LAB — COMPREHENSIVE METABOLIC PANEL
ALT: 24 U/L (ref 0–44)
AST: 22 U/L (ref 15–41)
Albumin: 4.4 g/dL (ref 3.5–5.0)
Alkaline Phosphatase: 94 U/L (ref 38–126)
Anion gap: 14 (ref 5–15)
BUN: 14 mg/dL (ref 6–20)
CO2: 19 mmol/L — ABNORMAL LOW (ref 22–32)
Calcium: 9.1 mg/dL (ref 8.9–10.3)
Chloride: 105 mmol/L (ref 98–111)
Creatinine, Ser: 0.87 mg/dL (ref 0.44–1.00)
GFR, Estimated: >60 mL/min (ref >60)
Glucose, Bld: 149 mg/dL — ABNORMAL HIGH (ref 70–99)
Potassium: 3.2 mmol/L — ABNORMAL LOW (ref 3.5–5.1)
Sodium: 138 mmol/L (ref 135–145)
Total Bilirubin: 0.5 mg/dL (ref 0.3–1.2)
Total Protein: 7.9 g/dL (ref 6.5–8.1)

## 2023-01-29 LAB — LIPASE, BLOOD: Lipase: 33 U/L (ref 11–51)

## 2023-01-29 SURGERY — APPENDECTOMY, ROBOT-ASSISTED, LAPAROSCOPIC
Anesthesia: General | Site: Abdomen

## 2023-01-29 MED ORDER — LABETALOL HCL 5 MG/ML IV SOLN
INTRAVENOUS | Status: AC
  Filled 2023-01-29: qty 4

## 2023-01-29 MED ORDER — HYDROMORPHONE HCL 1 MG/ML IJ SOLN
1.0000 mg | Freq: Once | INTRAMUSCULAR | Status: AC
  Administered 2023-01-29: 1 mg via INTRAVENOUS
  Filled 2023-01-29: qty 1

## 2023-01-29 MED ORDER — ONDANSETRON HCL 4 MG/2ML IJ SOLN
INTRAMUSCULAR | Status: AC
  Filled 2023-01-29: qty 2

## 2023-01-29 MED ORDER — SUCCINYLCHOLINE CHLORIDE 200 MG/10ML IV SOSY
PREFILLED_SYRINGE | INTRAVENOUS | Status: DC | PRN
  Administered 2023-01-29: 160 mg via INTRAVENOUS

## 2023-01-29 MED ORDER — FENTANYL CITRATE (PF) 250 MCG/5ML IJ SOLN
INTRAMUSCULAR | Status: DC | PRN
  Administered 2023-01-29 (×5): 50 ug via INTRAVENOUS

## 2023-01-29 MED ORDER — OXYCODONE HCL 5 MG PO TABS: 5.0000 mg | ORAL_TABLET | ORAL | 0 refills | Status: DC | PRN

## 2023-01-29 MED ORDER — FENTANYL CITRATE (PF) 250 MCG/5ML IJ SOLN
INTRAMUSCULAR | Status: AC
  Filled 2023-01-29: qty 5

## 2023-01-29 MED ORDER — DEXAMETHASONE SODIUM PHOSPHATE 10 MG/ML IJ SOLN
INTRAMUSCULAR | Status: AC
  Filled 2023-01-29: qty 1

## 2023-01-29 MED ORDER — DEXAMETHASONE SODIUM PHOSPHATE 10 MG/ML IJ SOLN
INTRAMUSCULAR | Status: DC | PRN
  Administered 2023-01-29: 10 mg via INTRAVENOUS

## 2023-01-29 MED ORDER — ONDANSETRON 4 MG PO TBDP: 4.0000 mg | ORAL_TABLET | Freq: Once | ORAL | Status: DC

## 2023-01-29 MED ORDER — LACTATED RINGERS IV SOLN: INTRAVENOUS | Status: DC | PRN

## 2023-01-29 MED ORDER — LABETALOL HCL 5 MG/ML IV SOLN
INTRAVENOUS | Status: DC | PRN
  Administered 2023-01-29: 5 mg via INTRAVENOUS
  Administered 2023-01-29: 10 mg via INTRAVENOUS
  Administered 2023-01-29: 5 mg via INTRAVENOUS

## 2023-01-29 MED ORDER — MIDAZOLAM HCL 2 MG/2ML IJ SOLN
INTRAMUSCULAR | Status: AC
  Filled 2023-01-29: qty 2

## 2023-01-29 MED ORDER — LIDOCAINE HCL (CARDIAC) PF 100 MG/5ML IV SOSY
PREFILLED_SYRINGE | INTRAVENOUS | Status: DC | PRN
  Administered 2023-01-29: 100 mg via INTRATRACHEAL

## 2023-01-29 MED ORDER — METRONIDAZOLE 500 MG/100ML IV SOLN
500.0000 mg | Freq: Once | INTRAVENOUS | Status: AC
  Administered 2023-01-29: 500 mg via INTRAVENOUS
  Filled 2023-01-29: qty 100

## 2023-01-29 MED ORDER — METOCLOPRAMIDE HCL 5 MG/ML IJ SOLN
INTRAMUSCULAR | Status: AC
  Filled 2023-01-29: qty 2

## 2023-01-29 MED ORDER — ROCURONIUM BROMIDE 10 MG/ML (PF) SYRINGE
PREFILLED_SYRINGE | INTRAVENOUS | Status: AC
  Filled 2023-01-29: qty 10

## 2023-01-29 MED ORDER — SODIUM CHLORIDE 0.9 % IV SOLN
2.0000 g | INTRAVENOUS | Status: AC
  Administered 2023-01-29: 2 g via INTRAVENOUS
  Filled 2023-01-29: qty 2

## 2023-01-29 MED ORDER — PHENYLEPHRINE 80 MCG/ML (10ML) SYRINGE FOR IV PUSH (FOR BLOOD PRESSURE SUPPORT)
PREFILLED_SYRINGE | INTRAVENOUS | Status: DC | PRN
  Administered 2023-01-29: 160 ug via INTRAVENOUS
  Administered 2023-01-29: 80 ug via INTRAVENOUS

## 2023-01-29 MED ORDER — ROCURONIUM BROMIDE 10 MG/ML (PF) SYRINGE
PREFILLED_SYRINGE | INTRAVENOUS | Status: DC | PRN
  Administered 2023-01-29: 60 mg via INTRAVENOUS
  Administered 2023-01-29: 20 mg via INTRAVENOUS

## 2023-01-29 MED ORDER — IOHEXOL 300 MG/ML  SOLN
100.0000 mL | Freq: Once | INTRAMUSCULAR | Status: AC | PRN
  Administered 2023-01-29: 100 mL via INTRAVENOUS

## 2023-01-29 MED ORDER — ONDANSETRON HCL 4 MG/2ML IJ SOLN: 4.0000 mg | Freq: Once | INTRAMUSCULAR | Status: DC | PRN

## 2023-01-29 MED ORDER — SODIUM CHLORIDE 0.9 % IV SOLN
2.0000 g | Freq: Once | INTRAVENOUS | Status: AC
  Administered 2023-01-29: 2 g via INTRAVENOUS
  Filled 2023-01-29: qty 20

## 2023-01-29 MED ORDER — LIDOCAINE HCL (PF) 2 % IJ SOLN
INTRAMUSCULAR | Status: AC
  Filled 2023-01-29: qty 5

## 2023-01-29 MED ORDER — FENTANYL CITRATE PF 50 MCG/ML IJ SOSY
50.0000 ug | PREFILLED_SYRINGE | Freq: Once | INTRAMUSCULAR | Status: DC
  Administered 2023-01-29: 50 ug via INTRAVENOUS
  Filled 2023-01-29: qty 1

## 2023-01-29 MED ORDER — ONDANSETRON HCL 4 MG/2ML IJ SOLN
INTRAMUSCULAR | Status: DC | PRN
  Administered 2023-01-29: 4 mg via INTRAVENOUS

## 2023-01-29 MED ORDER — MEPERIDINE HCL 50 MG/ML IJ SOLN: 6.2500 mg | INTRAMUSCULAR | Status: DC | PRN

## 2023-01-29 MED ORDER — FENTANYL CITRATE (PF) 100 MCG/2ML IJ SOLN
INTRAMUSCULAR | Status: AC
  Filled 2023-01-29: qty 2

## 2023-01-29 MED ORDER — PROPOFOL 10 MG/ML IV BOLUS
INTRAVENOUS | Status: DC | PRN
  Administered 2023-01-29: 200 mg via INTRAVENOUS

## 2023-01-29 MED ORDER — PROPOFOL 10 MG/ML IV BOLUS
INTRAVENOUS | Status: AC
  Filled 2023-01-29: qty 20

## 2023-01-29 MED ORDER — STERILE WATER FOR IRRIGATION IR SOLN
Status: DC | PRN
  Administered 2023-01-29: 1000 mL

## 2023-01-29 MED ORDER — MIDAZOLAM HCL 5 MG/5ML IJ SOLN
INTRAMUSCULAR | Status: DC | PRN
  Administered 2023-01-29 (×2): 1 mg via INTRAVENOUS

## 2023-01-29 MED ORDER — BUPIVACAINE HCL (PF) 0.5 % IJ SOLN
INTRAMUSCULAR | Status: DC | PRN
  Administered 2023-01-29: 30 mL

## 2023-01-29 MED ORDER — CHLORHEXIDINE GLUCONATE CLOTH 2 % EX PADS: 6.0000 | MEDICATED_PAD | Freq: Once | CUTANEOUS | Status: DC

## 2023-01-29 MED ORDER — ONDANSETRON HCL 4 MG PO TABS: 4.0000 mg | ORAL_TABLET | Freq: Three times a day (TID) | ORAL | 1 refills | Status: AC | PRN

## 2023-01-29 MED ORDER — FENTANYL CITRATE (PF) 100 MCG/2ML IJ SOLN: 100.0000 ug | Freq: Once | INTRAMUSCULAR | Status: DC

## 2023-01-29 MED ORDER — SODIUM CHLORIDE 0.9 % IV BOLUS
1000.0000 mL | Freq: Once | INTRAVENOUS | Status: AC
  Administered 2023-01-29: 1000 mL via INTRAVENOUS

## 2023-01-29 MED ORDER — ONDANSETRON HCL 4 MG/2ML IJ SOLN
4.0000 mg | Freq: Once | INTRAMUSCULAR | Status: DC
  Administered 2023-01-29: 4 mg via INTRAVENOUS
  Filled 2023-01-29: qty 2

## 2023-01-29 MED ORDER — METOCLOPRAMIDE HCL 5 MG/ML IJ SOLN
INTRAMUSCULAR | Status: DC | PRN
  Administered 2023-01-29: 10 mg via INTRAVENOUS

## 2023-01-29 MED ORDER — SUGAMMADEX SODIUM 200 MG/2ML IV SOLN
INTRAVENOUS | Status: DC | PRN
  Administered 2023-01-29: 400 mg via INTRAVENOUS

## 2023-01-29 MED ORDER — BUPIVACAINE HCL (PF) 0.5 % IJ SOLN
INTRAMUSCULAR | Status: AC
  Filled 2023-01-29: qty 30

## 2023-01-29 MED ORDER — HYDROMORPHONE HCL 1 MG/ML IJ SOLN: 0.2500 mg | INTRAMUSCULAR | Status: DC | PRN

## 2023-01-29 MED ORDER — SUCCINYLCHOLINE CHLORIDE 200 MG/10ML IV SOSY
PREFILLED_SYRINGE | INTRAVENOUS | Status: AC
  Filled 2023-01-29: qty 10

## 2023-01-29 SURGICAL SUPPLY — 46 items
ADH SKN CLS APL DERMABOND .7 (GAUZE/BANDAGES/DRESSINGS) ×1
APL PRP STRL LF DISP 70% ISPRP (MISCELLANEOUS) ×1
BLADE SURG 15 STRL LF DISP TIS (BLADE) ×1 IMPLANT
BLADE SURG 15 STRL SS (BLADE) ×1
CANNULA REDUCER 12-8 DVNC XI (CANNULA) ×1 IMPLANT
CHLORAPREP W/TINT 26 (MISCELLANEOUS) ×1 IMPLANT
COVER TIP SHEARS 8 DVNC (MISCELLANEOUS) ×1 IMPLANT
DERMABOND ADVANCED .7 DNX12 (GAUZE/BANDAGES/DRESSINGS) ×1 IMPLANT
DRAPE ARM DVNC X/XI (DISPOSABLE) ×3 IMPLANT
DRAPE COLUMN DVNC XI (DISPOSABLE) ×1 IMPLANT
ELECT REM PT RETURN 9FT ADLT (ELECTROSURGICAL) ×1
ELECTRODE REM PT RTRN 9FT ADLT (ELECTROSURGICAL) ×1 IMPLANT
GAUZE 4X4 16PLY ~~LOC~~+RFID DBL (SPONGE) ×1 IMPLANT
GLOVE BIO SURGEON STRL SZ 6.5 (GLOVE) ×1 IMPLANT
GLOVE BIOGEL PI IND STRL 6.5 (GLOVE) ×1 IMPLANT
GLOVE BIOGEL PI IND STRL 7.0 (GLOVE) ×2 IMPLANT
GOWN STRL REUS W/TWL LRG LVL3 (GOWN DISPOSABLE) ×3 IMPLANT
KIT PINK PAD W/HEAD ARE REST (MISCELLANEOUS) ×1 IMPLANT
KIT PINK PAD W/HEAD ARM REST (MISCELLANEOUS) ×1 IMPLANT
KIT TURNOVER KIT A (KITS) ×1 IMPLANT
MANIFOLD NEPTUNE II (INSTRUMENTS) ×1 IMPLANT
NDL INSUFFLATION 14GA 120MM (NEEDLE) ×1 IMPLANT
NEEDLE HYPO 22GX1.5 SAFETY (NEEDLE) ×1 IMPLANT
NEEDLE INSUFFLATION 14GA 120MM (NEEDLE) ×1 IMPLANT
NS IRRIG 500ML POUR BTL (IV SOLUTION) ×1 IMPLANT
OBTURATOR OPTICAL STND 8 DVNC (TROCAR) ×1
OBTURATOR OPTICALSTD 8 DVNC (TROCAR) ×1 IMPLANT
PACK LAP CHOLE LZT030E (CUSTOM PROCEDURE TRAY) ×1 IMPLANT
PENCIL ELECTRO HAND CTR (MISCELLANEOUS) ×1 IMPLANT
RELOAD STAPLE 45 3.5 BLU DVNC (STAPLE) ×1 IMPLANT
RELOAD STAPLER 3.5X45 BLU DVNC (STAPLE) ×1 IMPLANT
SEAL CANN UNIV 5-8 DVNC XI (MISCELLANEOUS) ×2 IMPLANT
SEALER VESSEL EXT DVNC XI (MISCELLANEOUS) IMPLANT
SET BASIN LINEN APH (SET/KITS/TRAYS/PACK) ×1 IMPLANT
SET TUBE SMOKE EVAC HIGH FLOW (TUBING) ×1 IMPLANT
STAPLER 45 SUREFORM DVNC (STAPLE) ×1 IMPLANT
STAPLER CANNULA SEAL DVNC XI (STAPLE) ×1 IMPLANT
STAPLER RELOAD 3.5X45 BLU DVNC (STAPLE) ×1
SUT MNCRL 4-0 (SUTURE) ×1
SUT MNCRL 4-0 27XMFL (SUTURE) ×1
SUT VICRYL 0 UR6 27IN ABS (SUTURE) IMPLANT
SUTURE MNCRL 4-0 27XMF (SUTURE) ×1 IMPLANT
SYR 30ML LL (SYRINGE) ×1 IMPLANT
TAPE TRANSPORE STRL 2 31045 (GAUZE/BANDAGES/DRESSINGS) ×1 IMPLANT
TRAY FOLEY MTR SLVR 16FR STAT (SET/KITS/TRAYS/PACK) ×1 IMPLANT
WATER STERILE IRR 500ML POUR (IV SOLUTION) ×1 IMPLANT

## 2023-01-29 NOTE — ED Notes (Signed)
Pt ambulated to the bathroom unassisted.  

## 2023-01-29 NOTE — ED Notes (Signed)
Patient's earrings, wallet and black Crocs given to patient's husband.

## 2023-01-29 NOTE — ED Provider Notes (Signed)
[place] EMERGENCY DEPARTMENT AT [facility]  Provider Note  CSN: [id number] Arrival date & time: 01/29/23 8295  History Chief Complaint  Patient presents with   Abdominal Pain    Joanna Newman is a 52 y.o. female with history of prior cholecystectomy and hysterectomy reports onset of severe cramping periumbilical pain and vomiting around 2000hrs on 4/19. Not improving with time, called EMS tonight for evaluation. She reports increased urine frequency. Little stool output today. No fevers but has felt sweaty. No prior history of similar.    Home Medications Prior to Admission medications   Medication Sig Start Date End Date Taking? Authorizing Provider  albuterol (VENTOLIN HFA) 108 (90 Base) MCG/ACT inhaler Inhale 2 puffs into the lungs every 4 (four) hours as needed for wheezing or shortness of breath. 06/27/20   [provider]  amLODipine (NORVASC) 5 MG tablet Take 5 mg by mouth daily. 04/20/22   [provider]  aspirin EC 325 MG EC tablet Take 1 tablet (325 mg total) by mouth daily. 08/06/16   Clydia Llano, MD  atorvastatin (LIPITOR) 20 MG tablet Take 1 tablet (20 mg total) by mouth daily at 6 PM. 08/06/16   Clydia Llano, MD  clobetasol cream (TEMOVATE) 0.05 % Apply 1 application topically daily as needed (eczema).    [provider]  estradiol (ESTRACE) 0.1 MG/GM vaginal cream 1 gram at bedtime 06/18/22   Lazaro Arms, MD  ferrous gluconate (FERGON) 324 MG tablet Take 1 tablet (324 mg total) by mouth daily with breakfast. 05/25/22   Lazaro Arms, MD  fluconazole (DIFLUCAN) 100 MG tablet Take 1 tablet (100 mg total) by mouth daily. 10/12/22   Lazaro Arms, MD  fluticasone (CUTIVATE) 0.05 % cream Apply topically 2 (two) times daily. For one week then daily, gradually taper use Patient taking differently: Apply 1 application  topically daily as needed (eczema). 01/26/21   Adline Potter, NP  metFORMIN (GLUCOPHAGE) 500 MG tablet Take 500  mg by mouth 2 (two) times daily.     [provider]  ondansetron (ZOFRAN) 8 MG tablet Take 1 tablet (8 mg total) by mouth every 8 (eight) hours as needed for nausea or vomiting. 05/12/22   Lazaro Arms, MD  Semaglutide,0.25 or 0.5MG /DOS, (OZEMPIC, 0.25 OR 0.5 MG/DOSE,) 2 MG/1.5ML SOPN Inject into the skin.    [provider]  SYMBICORT 80-4.5 MCG/ACT inhaler Inhale 2 puffs into the lungs in the morning and at bedtime. 04/20/22   [provider]  traZODone (DESYREL) 50 MG tablet Take 1 tablet (50 mg total) by mouth at bedtime. 10/12/22   Lazaro Arms, MD  venlafaxine XR (EFFEXOR-XR) 75 MG 24 hr capsule Take 2 capsules (150 mg total) by mouth daily with breakfast. 04/26/19   Allie Bossier, MD  zolpidem (AMBIEN) 10 MG tablet Take 1 tablet (10 mg total) by mouth at bedtime as needed for sleep. 06/30/22   Lazaro Arms, MD     Allergies    Dapagliflozin and Lisinopril   Review of Systems   Review of Systems Please see HPI for pertinent positives and negatives  Physical Exam BP (!) 135/98   Pulse 80   Temp 98 F (36.7 C) (Oral)   Resp 18   Ht 5\' 3"  (1.6 m)   Wt 123 kg   LMP 09/10/2021 Comment: period x 1 month  SpO2 99%   BMI 48.03 kg/m   Physical Exam Vitals and nursing note reviewed.  Constitutional:      Appearance: Normal appearance.  HENT:     Head: Normocephalic and atraumatic.     Nose: Nose normal.     Mouth/Throat:     Mouth: Mucous membranes are moist.  Eyes:     Extraocular Movements: Extraocular movements intact.     Conjunctiva/sclera: Conjunctivae normal.  Cardiovascular:     Rate and Rhythm: Normal rate.  Pulmonary:     Effort: Pulmonary effort is normal.     Breath sounds: Normal breath sounds.  Abdominal:     General: Abdomen is flat. Bowel sounds are decreased.     Palpations: Abdomen is soft.     Tenderness: There is generalized abdominal tenderness. There is no guarding. Negative signs include Murphy's sign and McBurney's sign.   Musculoskeletal:        General: No swelling. Normal range of motion.     Cervical back: Neck supple.  Skin:    General: Skin is warm and dry.  Neurological:     General: No focal deficit present.     Mental Status: She is alert.  Psychiatric:        Mood and Affect: Mood normal.     ED Results / Procedures / Treatments   EKG None  Procedures Procedures  Medications Ordered in the ED Medications  HYDROmorphone (DILAUDID) injection 1 mg (has no administration in time range)  cefTRIAXone (ROCEPHIN) 2 g in sodium chloride 0.9 % 100 mL IVPB (has no administration in time range)  metroNIDAZOLE (FLAGYL) IVPB 500 mg (has no administration in time range)  sodium chloride 0.9 % bolus 1,000 mL (1,000 mLs Intravenous New Bag/Given 01/29/23 0543)  iohexol (OMNIPAQUE) 300 MG/ML solution 100 mL (100 mLs Intravenous Contrast Given 01/29/23 0521)    Initial Impression and Plan  Patient with prior abdominal surgeries, here for periumbilical abdominal pain and vomiting, concerning for SBO. Will check labs, send for CT. Pain/nausea meds and IVF for comfort.   ED Course   Clinical Course as of 01/29/23 0623  Sat Jan 29, 2023  0409 CBC with mild leukocytosis. Otherwise normal.  [CS]  0422 CMP, lipase and UA are unremarkable.  [CS]  [phone] Patient with difficulty IV access, attempted multiple time by RN. I attempted bilateral EJ and R basilic vein with US guidance without success. Will change meds to IM and send for CT without contrast.  [CS]  2841 I personally viewed the images from radiology studies and agree with radiologist interpretation: CT concerning for acute appendicitis. Also has a fat containing umbilical hernia. Still having pain, will give another dose of pain meds. Abx ordered. Consult Gen Surg.  [CS]  3244 Spoke with Dr. Henreitta Leber, Surgery, who will evaluate the patient for surgery. RN was able to get IV access in hand.  [CS]    Clinical Course User Index [CS] Pollyann Savoy, MD      MDM Rules/Calculators/A&P Medical Decision Making Problems Addressed: Acute appendicitis with localized peritonitis, without perforation, abscess, or gangrene: acute illness or injury  Amount and/or Complexity of Data Reviewed Labs: ordered. Decision-making details documented in ED Course. Radiology: ordered and independent interpretation performed. Decision-making details documented in ED Course.  Risk Prescription drug management. Parenteral controlled substances. Decision regarding hospitalization.     Final Clinical Impression(s) / ED Diagnoses Final diagnoses:  Acute appendicitis with localized peritonitis, without perforation, abscess, or gangrene    Rx / DC Orders ED Discharge Orders     None  Pollyann Savoy, MD 01/29/23 [phone]

## 2023-01-29 NOTE — ED Triage Notes (Signed)
C/o severe lower abdominal pain, n/v that starts roughly 2 hours ago.    Per ems: CBG 198

## 2023-01-29 NOTE — ED Notes (Signed)
Gave pt phone to call spouse

## 2023-01-29 NOTE — ED Notes (Signed)
This RN and another RN attempted to start an IV. Unsuccessful at this time. Another RN to to ultrasound IV

## 2023-01-29 NOTE — Discharge Instructions (Signed)
Discharge Robotic Assisted Laparoscopic Surgery Instructions:  Common Complaints: Right shoulder pain is common after laparoscopic surgery.  This is secondary to the gas used in the surgery being trapped under the diaphragm.  Walk to help your body absorb the gas. This will improve in a few days. Pain at the port sites are common, especially the larger port sites. This will improve with time.  Some nausea is common and poor appetite. The main goal is to stay hydrated the first few days after surgery.   Diet/ Activity: Diet as tolerated. You may not have an appetite, but it is important to stay hydrated.  Drink 64 ounces of water a day. Your appetite will return with time.  Shower per your regular routine daily.  Do not take hot showers. Take warm showers that are less than 10 minutes. Rest and listen to your body, but do not remain in bed all day.  Walk everyday for at least 15-20 minutes. Deep cough and move around every 1-2 hours in the first few days after surgery.  Do not lift > 10 lbs, perform excessive bending, pushing, pulling, squatting for 2 weeks after surgery.  Do not pick at the dermabond glue on your incision sites.  This glue film will remain in place for 1-2 weeks and will start to peel off.  Do not place lotions or balms on your incision unless instructed to specifically by Dr. Henreitta Leber.   Pain Expectations and Narcotics: -After surgery you will have pain associated with your incisions and this is normal. The pain is muscular and nerve pain, and will get better with time. -You are encouraged and expected to take non narcotic medications like tylenol and ibuprofen (when able) to treat pain as multiple modalities can aid with pain treatment. -Narcotics are only used when pain is severe or there is breakthrough pain. -You are not expected to have a pain score of 0 after surgery, as we cannot prevent pain. A pain score of 3-4 that allows you to be functional, move, walk, and  tolerate some activity is the goal. The pain will continue to improve over the days after surgery and is dependent on your surgery. -Due to [place] law, we are only able to give a certain amount of pain medication to treat post operative pain, and we only give additional narcotics on a patient by patient basis.  -For most laparoscopic surgery, studies have shown that the majority of patients only need 10-15 narcotic pills, and for open surgeries most patients only need 15-20.   -Having appropriate expectations of pain and knowledge of pain management with non narcotics is important as we do not want anyone to become addicted to narcotic pain medication.  -Using ice packs in the first 48 hours and heating pads after 48 hours, wearing an abdominal binder (when recommended), and using over the counter medications are all ways to help with pain management.   -Simple acts like meditation and mindfulness practices after surgery can also help with pain control and research has proven the benefit of these practices.  Medication: Take tylenol and ibuprofen as needed for pain control, alternating every 4-6 hours.  Example:  Tylenol  @ 6am, 12noon, 6pm, (Do not exceed  of tylenol a day). Ibuprofen  @ 9am, 3pm, 9pm, 3am (Do not exceed  of ibuprofen a day).  Take Roxicodone for breakthrough pain every 4 hours.  Take Colace for constipation related to narcotic pain medication. If you do not have a bowel movement in  2 days, take Miralax over the counter.  Drink plenty of water to also prevent constipation.   Contact Information: If you have questions or concerns, please call our office, [phone], Monday- Thursday 8AM-5PM and Friday 8AM-12Noon.  If it is after hours or on the weekend, please call [facility] Main Number, [phone], [phone], and ask to speak to the surgeon on call for Dr. Constance Haw at [facility].

## 2023-01-29 NOTE — Anesthesia Postprocedure Evaluation (Signed)
Anesthesia Post Note  Patient: Joanna Newman  Procedure(s) Performed: XI ROBOTIC LAPAROSCOPIC ASSISTED APPENDECTOMY (Abdomen)  Patient location during evaluation: PACU Anesthesia Type: General Level of consciousness: awake and alert and oriented Pain management: pain level controlled Vital Signs Assessment: post-procedure vital signs reviewed and stable Respiratory status: spontaneous breathing, nonlabored ventilation, respiratory function stable and patient connected to face mask oxygen Cardiovascular status: blood pressure returned to baseline and stable Postop Assessment: no apparent nausea or vomiting Anesthetic complications: no  No notable events documented.   Last Vitals:  Vitals:   01/29/23 0734 01/29/23 1145  BP: (!) 184/95 (!) 162/97  Pulse: 73 91  Resp: 18 (!) 23  Temp: 36.5 C (!) 36.4 C  SpO2: 98% (!) 86%    Last Pain:  Vitals:   01/29/23 1145  TempSrc:   PainSc: Asleep                 Ishan Sanroman C Everlean Bucher

## 2023-01-29 NOTE — Anesthesia Preprocedure Evaluation (Addendum)
Anesthesia Evaluation  Patient identified by MRN, date of birth, ID band Patient awake    Reviewed: Allergy & Precautions, H&P , NPO status , Patient's Chart, lab work & pertinent test results  Airway Mallampati: III  TM Distance: >3 FB Neck ROM: Full    Dental  (+) Dental Advisory Given, Missing   Pulmonary asthma , sleep apnea , former smoker   Pulmonary exam normal breath sounds clear to auscultation       Cardiovascular hypertension, Pt. on medications Normal cardiovascular exam Rhythm:Regular Rate:Normal     Neuro/Psych  PSYCHIATRIC DISORDERS Anxiety Depression    CVA    GI/Hepatic ,GERD  Medicated,,(+)     substance abuse  cocaine use and marijuana use  Endo/Other  diabetes (last dose of ozempic4/13/24), Well Controlled, Type 2, Oral Hypoglycemic Agents  Morbid obesity  Renal/GU negative Renal ROS  negative genitourinary   Musculoskeletal  (+) Arthritis , Osteoarthritis,    Abdominal   Peds negative pediatric ROS (+)  Hematology negative hematology ROS (+)   Anesthesia Other Findings   Reproductive/Obstetrics negative OB ROS                             Anesthesia Physical Anesthesia Plan  ASA: 3  Anesthesia Plan: General   Post-op Pain Management: Dilaudid IV   Induction: Intravenous, Rapid sequence and Cricoid pressure planned  PONV Risk Score and Plan: 4 or greater and Ondansetron, Dexamethasone, Metaclopromide and Midazolam  Airway Management Planned: Oral ETT  Additional Equipment:   Intra-op Plan:   Post-operative Plan: Extubation in OR  Informed Consent: I have reviewed the patients History and Physical, chart, labs and discussed the procedure including the risks, benefits and alternatives for the proposed anesthesia with the patient or authorized representative who has indicated his/her understanding and acceptance.     Dental advisory given  Plan  Discussed with: CRNA and Surgeon  Anesthesia Plan Comments:        Anesthesia Quick Evaluation

## 2023-01-29 NOTE — ED Notes (Signed)
Multiple attempts to start an IV by staff.  EDP attempted [place] unsuccessfully EDP advised to start an IV any size

## 2023-01-29 NOTE — Transfer of Care (Signed)
Immediate Anesthesia Transfer of Care Note  Patient: Joanna Newman  Procedure(s) Performed: XI ROBOTIC LAPAROSCOPIC ASSISTED APPENDECTOMY (Abdomen)  Patient Location: PACU  Anesthesia Type:General  Level of Consciousness: awake, alert , oriented, sedated, drowsy, and patient cooperative  Airway & Oxygen Therapy: Patient Spontanous Breathing and non-rebreather face mask  Post-op Assessment: Report given to RN and Post -op Vital signs reviewed and stable  Post vital signs: Reviewed and stable  Last Vitals:  Vitals Value Taken Time  BP 162/97 01/29/23 1145  Temp 36.4 C 01/29/23 1145  Pulse 79 01/29/23 1159  Resp 21 01/29/23 1159  SpO2 100 % 01/29/23 1159  Vitals shown include unvalidated device data.  Last Pain:  Vitals:   01/29/23 0734  TempSrc: Oral  PainSc:          Complications: No notable events documented.

## 2023-01-29 NOTE — Op Note (Signed)
Preoperative diagnosis: Acute appendicitis  Postoperative diagnosis: Same  Procedure: Robotic assisted laparoscopic appendectomy.  Anesthesia: General   Surgeon: Algis Greenhouse, MD   Wound Classification: Contaminated   Specimen: Appendix  Complications: None  Estimated Blood Loss: 20cc   Indications: Patient is a 52 y.o. female  presented with acute appendicitis on CT. The risk of surgery were explained to the patient including but not limited to bleeding, infection, finding a rupture, injury to other organs, needing to do an open procedure.   FIndings: Dilated, inflamed appendix  Description of procedure: The patient was placed on the operating table in the supine position, left arm tucked. General anesthesia was induced. A time-out was completed verifying correct patient, procedure, site, positioning, and implant(s) and/or special equipment prior to beginning this procedure. The abdomen was prepped and draped in the usual sterile fashion.   In Palmer's point an incision was made and Veress needle was inserted.  After confirming intraabdominal location with positive saline drop test and low insufflation pressures, gas insufflation was initiated until the abdominal pressure was measured at 15 mmHg.  Afterwards, the Veress needle was removed and a 8 mm port was placed through a Palmer's point site using Optiview technique. No injuries were noted. Two additional incision was made 8 cm apart each side along the left side of the abdominal wall from the initial incision.  Under direct visualization, an 8 mm port was in the left lower quadrant and the mid left side. The original 8 mm port was upsized to a 12 mm port and a reducer was placed.   No injuries from trocar placements were noted. The table was placed in the Trendelenburg position with the right side elevated.  Xi robotic platform was then brought to the operative field and docked. A vessel sealer was placed through the 12 mm port and  a forcep bipolar through the lower 8 mm port.   There was omental adhesions at her umbilical hernia site that were taken down with the vessel sealer. A loop of small bowel was in the pelvis adherent to the anterior abdominal wall, using scissors this was taken down with care. An appendix that appeared dilated and inflamed was identified and elevated.  Infection was present within the abdominal cavity due to appendicitis. Window created at base of appendix in the mesentery.  A white load linear cutting stapler was placed through the 12 mm port, and then used to divide and staple the base of the appendix. The vessel sealer was used to ligate the mesoappendix. No bleeding from the staple lines noted.  The appendix was placed in an endoscopic retrieval bag and removed through the 12 mm port.   There was no appendiceal stump remaining. The mesoappendix and staple line were examined again and hemostasis noted. No other pathology was identified within pelvis. The 12 mm trocar removed and port site closed with PMI using 0 vicryl under direct vision. Remaining trocars were removed. No bleeding was noted.The abdomen was allowed to collapse. All skin incisions then closed with subcuticular sutures Monocryl 4-0.  Wounds then dressed with dermabond.  The patient tolerated the procedure well, awakened from anesthesia and was taken to the postanesthesia care unit in satisfactory condition.  Sponge count and instrument count correct at the end of the procedure.  Algis Greenhouse, MD [facility] [address] [place], [place] [address] [phone] (office)

## 2023-01-29 NOTE — ED Notes (Signed)
Informed consent completed, at bedside.

## 2023-01-29 NOTE — Progress Notes (Signed)
[facility]  Updated husband. Can dc home if feeling ok. Rx to [facility]. Will do phone call in 2 weeks.   Algis Greenhouse, MD [facility] [address] [place], [place] [address] [phone] (office)

## 2023-01-29 NOTE — H&P (Signed)
[facility] History and Physical  Reason for Referral: Acute appendicitis  Referring Physician: Dr. Bernette Mayers  Chief Complaint   Abdominal Pain     Joanna Newman is a 52 y.o. female.  HPI: Joanna Newman is a 52 yo who is obese, DM, who is on ozempic and took it last Saturday. She comes in with abdominal pain and nausea/vomiting. She was brought by EMS to the ED. CT scan showing appendicitis without signs of perforation. She continues to have discomfort and required some dilaudid. She has had a hysterectomy and cholecystectomy in the past.   Past Medical History:  Diagnosis Date   Anxiety    Arthritis    Asthma    Bilateral ovarian cysts    Depression    Diabetes mellitus without complication    Epidermoid cyst of finger    tip of left little finger   Fibroids    GERD (gastroesophageal reflux disease)    Hyperlipidemia    Hypertension    Insomnia    LVH (left ventricular hypertrophy)    Moderate   Obesity    Pain of right heel    Sleep apnea    History of no problems since weight loss   Stroke 07/2016   No residual   Substance abuse    cocaine, marijuana and alcohol abuse. quit end of 2012    Past Surgical History:  Procedure Laterality Date   ABDOMINAL HYSTERECTOMY N/A 05/11/2022   Procedure: HYSTERECTOMY ABDOMINAL;  Surgeon: Lazaro Arms, MD;  Location: [facility];  Service: Gynecology;  Laterality: N/A;   BILATERAL SALPINGECTOMY Left 05/11/2022   Procedure: OPEN LEFT SALPINGECTOMY AND OOPHORECTOMY;  Surgeon: Lazaro Arms, MD;  Location: [facility];  Service: Gynecology;  Laterality: Left;   CHOLECYSTECTOMY     COLONOSCOPY WITH PROPOFOL N/A 08/31/2021   Procedure: COLONOSCOPY WITH PROPOFOL;  Surgeon: Lanelle Bal, DO;  Location: [facility];  Service: Endoscopy;  Laterality: N/A;  10:00am   HEEL SPUR SURGERY Bilateral    KNEE SURGERY     LAPAROTOMY WITH STAGING N/A 02/14/2018   Procedure: MINI-LAPAROTOMY;  Surgeon: Adolphus Birchwood, MD;  Location: [facility];  Service: Gynecology;  Laterality: N/A;   LYSIS OF ADHESION  02/14/2018   Procedure: LYSIS OF ADHESION;  Surgeon: Adolphus Birchwood, MD;  Location: [facility];  Service: Gynecology;;   POLYPECTOMY  08/31/2021   Procedure: POLYPECTOMY;  Surgeon: Lanelle Bal, DO;  Location: [facility];  Service: Endoscopy;;   ROBOTIC ASSISTED TOTAL HYSTERECTOMY WITH BILATERAL SALPINGO OOPHERECTOMY N/A 02/14/2018   Procedure: XI ROBOTIC ASSISTED RIGHT SALPINGO OOPHORECTOMY, OMENTECTOMY;  Surgeon: Adolphus Birchwood, MD;  Location: [facility];  Service: Gynecology;  Laterality: N/A;   TUBAL LIGATION      Family History  Problem Relation Age of Onset   Diabetes Paternal Grandmother    Diabetes Maternal Grandmother    CVA Maternal Grandmother 34   Heart disease Father 45       heart failure   Diabetes Father    Heart failure Father    Hypertension Mother    Cancer Mother 41       colon   Kidney failure Sister    CVA Maternal Aunt 61       Aneurysm    Social History   Tobacco Use   Smoking status: Former    Packs/day: 0.25    Years: 15.00    Additional pack years: 0.00    Total pack years: 3.75    Types:  Cigarettes    Quit date: 03/11/2014    Years since quitting: 8.8   Smokeless tobacco: Never  Vaping Use   Vaping Use: Never used  Substance Use Topics   Alcohol use: Not Currently   Drug use: Not Currently    Types: Marijuana    Comment: history of substance abuse; last marijuana use about 1 week ago    Medications: I have reviewed the patient's current medications. Current Facility-Administered Medications  Medication Dose Route Frequency Provider Last Rate Last Admin   cefoTEtan (CEFOTAN) 2 g in sodium chloride 0.9 % 100 mL IVPB  2 g Intravenous On Call to OR Lucretia Roers, MD       Chlorhexidine Gluconate Cloth 2 % PADS 6 each  6 each Topical Once Lucretia Roers, MD       Current Outpatient Medications  Medication Sig Dispense Refill Last Dose   albuterol (VENTOLIN HFA) 108 (90  Base) MCG/ACT inhaler Inhale 2 puffs into the lungs every 4 (four) hours as needed for wheezing or shortness of breath.      amLODipine (NORVASC) 5 MG tablet Take 5 mg by mouth daily.      aspirin EC 325 MG EC tablet Take 1 tablet (325 mg total) by mouth daily.      atorvastatin (LIPITOR) 20 MG tablet Take 1 tablet (20 mg total) by mouth daily at 6 PM. 30 tablet 0    clobetasol cream (TEMOVATE) 0.05 % Apply 1 application topically daily as needed (eczema).      estradiol (ESTRACE) 0.1 MG/GM vaginal cream 1 gram at bedtime 30 g 12    ferrous gluconate (FERGON) 324 MG tablet Take 1 tablet (324 mg total) by mouth daily with breakfast. 30 tablet 3    fluconazole (DIFLUCAN) 100 MG tablet Take 1 tablet (100 mg total) by mouth daily. 30 tablet 0    fluticasone (CUTIVATE) 0.05 % cream Apply topically 2 (two) times daily. For one week then daily, gradually taper use (Patient taking differently: Apply 1 application  topically daily as needed (eczema).) 30 g 0    metFORMIN (GLUCOPHAGE) 500 MG tablet Take 500 mg by mouth 2 (two) times daily.       ondansetron (ZOFRAN) 8 MG tablet Take 1 tablet (8 mg total) by mouth every 8 (eight) hours as needed for nausea or vomiting. 20 tablet 0    Semaglutide,0.25 or 0.5MG /DOS, (OZEMPIC, 0.25 OR 0.5 MG/DOSE,) 2 MG/1.5ML SOPN Inject into the skin.      SYMBICORT 80-4.5 MCG/ACT inhaler Inhale 2 puffs into the lungs in the morning and at bedtime.      traZODone (DESYREL) 50 MG tablet Take 1 tablet (50 mg total) by mouth at bedtime. 30 tablet 2    venlafaxine XR (EFFEXOR-XR) 75 MG 24 hr capsule Take 2 capsules (150 mg total) by mouth daily with breakfast. 60 capsule 12    zolpidem (AMBIEN) 10 MG tablet Take 1 tablet (10 mg total) by mouth at bedtime as needed for sleep. 30 tablet 0     Allergies  Allergen Reactions   Dapagliflozin Other (See Comments)    Yeast infection   Lisinopril Swelling      ROS:  A comprehensive review of systems was negative except for:  Gastrointestinal: positive for abdominal pain, nausea, and vomiting Genitourinary: positive for frequency  Blood pressure (!) 184/95, pulse 73, temperature 97.7 F (36.5 C), temperature source Oral, resp. rate 18, height 5\' 3"  (1.6 m), weight 123 kg, last menstrual period 09/10/2021,  SpO2 98 %. Physical Exam Vitals reviewed.  Constitutional:      Appearance: She is obese.  HENT:     Head: Normocephalic.  Cardiovascular:     Rate and Rhythm: Normal rate.  Pulmonary:     Effort: Pulmonary effort is normal.  Abdominal:     General: There is no distension.     Palpations: Abdomen is soft.     Tenderness: There is abdominal tenderness in the right lower quadrant and suprapubic area.  Musculoskeletal:     Comments: Moves all extremities   Skin:    General: Skin is warm.  Neurological:     General: No focal deficit present.     Mental Status: She is alert and oriented to person, place, and time.  Psychiatric:        Mood and Affect: Mood normal.        Behavior: Behavior normal.     Results: Results for orders placed or performed during the hospital encounter of 01/29/23 (from the past 48 hour(s))  Comprehensive metabolic panel     Status: Abnormal   Collection Time: 01/29/23  4:00 AM  Result Value Ref Range   Sodium 138 135 - 145 mmol/L   Potassium 3.2 (L) 3.5 - 5.1 mmol/L   Chloride 105 98 - 111 mmol/L   CO2 19 (L) 22 - 32 mmol/L   Glucose, Bld 149 (H) 70 - 99 mg/dL    Comment: Glucose reference range applies only to samples taken after fasting for at least 8 hours.   BUN 14 6 - 20 mg/dL   Creatinine, Ser 0.27 0.44 - 1.00 mg/dL   Calcium 9.1 8.9 - 25.3 mg/dL   Total Protein 7.9 6.5 - 8.1 g/dL   Albumin 4.4 3.5 - 5.0 g/dL   AST 22 15 - 41 U/L   ALT 24 0 - 44 U/L   Alkaline Phosphatase 94 38 - 126 U/L   Total Bilirubin 0.5 0.3 - 1.2 mg/dL   GFR, Estimated >66 >44 mL/min    Comment: (NOTE) Calculated using the CKD-EPI Creatinine Equation (2021)    Anion gap 14 5 - 15     Comment: Performed at [facility], [address], [place], [place] [address]  Lipase, blood     Status: None   Collection Time: 01/29/23  4:00 AM  Result Value Ref Range   Lipase 33 11 - 51 U/L    Comment: Performed at [facility], [address] [place], [place] [address]  CBC with Differential     Status: Abnormal   Collection Time: 01/29/23  4:00 AM  Result Value Ref Range   WBC 12.9 (H) 4.0 - 10.5 K/uL   RBC 4.58 3.87 - 5.11 MIL/uL   Hemoglobin 12.5 12.0 - 15.0 g/dL   HCT 38.7 56.4 - 33.2 %   MCV 83.6 80.0 - 100.0 fL   MCH 27.3 26.0 - 34.0 pg   MCHC 32.6 30.0 - 36.0 g/dL   RDW 95.1 88.4 - 16.6 %   Platelets 395 150 - 400 K/uL   nRBC 0.0 0.0 - 0.2 %   Neutrophils Relative % 75 %   Neutro Abs 9.6 (H) 1.7 - 7.7 K/uL   Lymphocytes Relative 19 %   Lymphs Abs 2.5 0.7 - 4.0 K/uL   Monocytes Relative 4 %   Monocytes Absolute 0.5 0.1 - 1.0 K/uL   Eosinophils Relative 0 %   Eosinophils Absolute 0.1 0.0 - 0.5 K/uL   Basophils Relative 1 %  Basophils Absolute 0.1 0.0 - 0.1 K/uL   Immature Granulocytes 1 %   Abs Immature Granulocytes 0.07 0.00 - 0.07 K/uL    Comment: Performed at [facility], [address] [place], [place] [address]  Urinalysis, w/ Reflex to Culture (Infection Suspected) -Urine, Clean Catch     Status: Abnormal   Collection Time: 01/29/23  4:00 AM  Result Value Ref Range   Specimen Source URINE, CLEAN CATCH    Color, Urine STRAW (A) YELLOW   APPearance CLEAR CLEAR   Specific Gravity, Urine 1.011 1.005 - 1.030   pH 8.0 5.0 - 8.0   Glucose, UA NEGATIVE NEGATIVE mg/dL   Hgb urine dipstick NEGATIVE NEGATIVE   Bilirubin Urine NEGATIVE NEGATIVE   Ketones, ur 5 (A) NEGATIVE mg/dL   Protein, ur NEGATIVE NEGATIVE mg/dL   Nitrite NEGATIVE NEGATIVE   Leukocytes,Ua NEGATIVE NEGATIVE   RBC / HPF 0-5 0 - 5 RBC/hpf   WBC, UA 0-5 0 - 5 WBC/hpf    Comment:        Reflex urine culture not performed if WBC <=10, OR if Squamous epithelial cells >5. If Squamous  epithelial cells >5 suggest recollection.    Bacteria, UA NONE SEEN NONE SEEN   Squamous Epithelial / HPF 0-5 0 - 5 /HPF    Comment: Performed at [facility], [address], [place], [place] [address]   Personally reviewed- dilated appendix tip in the pelvis, stranding CT ABDOMEN PELVIS W CONTRAST  Result Date: 01/29/2023 CLINICAL DATA:  52 year old female with history of severe lower abdominal pain with nausea and vomiting. Suspected bowel obstruction. EXAM: CT ABDOMEN AND PELVIS WITH CONTRAST TECHNIQUE: Multidetector CT imaging of the abdomen and pelvis was performed using the standard protocol following bolus administration of intravenous contrast. RADIATION DOSE REDUCTION: This exam was performed according to the departmental dose-optimization program which includes automated exposure control, adjustment of the mA and/or kV according to patient size and/or use of iterative reconstruction technique. CONTRAST:  OMNIPAQUE IOHEXOL 300 MG/ML  SOLN COMPARISON:  CT of the abdomen and pelvis 02/15/2018. FINDINGS: Lower chest: Unremarkable. Hepatobiliary: No suspicious cystic or solid hepatic lesions. No intra or extrahepatic biliary ductal dilatation. Status post cholecystectomy. Pancreas: No pancreatic mass. No pancreatic ductal dilatation. No pancreatic or peripancreatic fluid collections or inflammatory changes. Spleen: Unremarkable. Adrenals/Urinary Tract: Bilateral kidneys and bilateral adrenal glands are normal in appearance. No hydroureteronephrosis. Urinary bladder is unremarkable in appearance. Stomach/Bowel: The appearance of the stomach is unremarkable. There is no pathologic dilatation of small bowel or colon. The appendix appears dilated and inflamed compatible with acute appendicitis. Appendix: Location: Inferior to the cecum tracking along the right pelvic sidewall best visualized on coronal image 66 of series 5. Diameter: Up to 12 mm Appendicolith: None Mucosal hyper-enhancement: Present  Extraluminal gas: None Periappendiceal collection: Trace amount of periappendiceal stranding adjacent to the tip of the appendix best appreciated on coronal image 49 of series 5. No well-defined periappendiceal abscess. Vascular/Lymphatic: Atherosclerotic calcifications are noted in the abdominal aorta and pelvic vasculature, without evidence of aneurysm or dissection in the abdominal or pelvic vasculature. No lymphadenopathy noted in the abdomen or pelvis. Reproductive: Status post hysterectomy. Ovaries are not confidently identified may be surgically absent or atrophic. Other: Moderate-sized umbilical hernia containing only omental fat. No significant volume of ascites. No pneumoperitoneum. Musculoskeletal: There are no aggressive appearing lytic or blastic lesions noted in the visualized portions of the skeleton. IMPRESSION: 1. Findings are compatible with acute appendicitis, as above. Surgical consultation is strongly  recommended. 2. Moderate-sized umbilical hernia containing only omental fat. No associated bowel incarceration or obstruction at this time. 3. Aortic atherosclerosis. Electronically Signed   By: Trudie Reed M.D.   On: 01/29/2023 06:07     Assessment & Plan:  Joanna Newman is a 52 y.o. female with acute appendicitis.   -Discussed the risk of laparoscopic appendectomy and the option of antibiotics alone. Discussed that in [place] and some trials in the [place], antibiotics are used for simple appendicitis. Discussed that research shows a 40% failure rate for antibiotics alone.  Discussed risk of surgery including but not limited to bleeding, infection, injury to other organs, normal appendix, and after this discussion the patient has decided to proceed with procedure. Discussed risk of open procedure.    All questions were answered to the satisfaction of the patient.    Lucretia Roers 01/29/2023, 8:04 AM

## 2023-01-29 NOTE — ED Notes (Signed)
ED Provider at bedside. 

## 2023-01-29 NOTE — Anesthesia Procedure Notes (Signed)
Procedure Name: Intubation Date/Time: 01/29/2023 9:42 AM  Performed by: Molli Barrows, MDPre-anesthesia Checklist: Patient identified, Emergency Drugs available, Suction available and Patient being monitored Patient Re-evaluated:Patient Re-evaluated prior to induction Oxygen Delivery Method: Circle system utilized Preoxygenation: Pre-oxygenation with 100% oxygen Induction Type: IV induction, Cricoid Pressure applied and Rapid sequence Tube type: Oral Tube size: 7.0 mm Number of attempts: 1 Airway Equipment and Method: Stylet Placement Confirmation: ETT inserted through vocal cords under direct vision, positive ETCO2 and breath sounds checked- equal and bilateral Secured at: 21 cm Tube secured with: Tape Dental Injury: Teeth and Oropharynx as per pre-operative assessment

## 2023-02-01 LAB — SURGICAL PATHOLOGY

## 2023-02-04 ENCOUNTER — Other Ambulatory Visit (INDEPENDENT_AMBULATORY_CARE_PROVIDER_SITE_OTHER): Payer: PRIVATE HEALTH INSURANCE | Admitting: *Deleted

## 2023-02-04 DIAGNOSIS — K353 Acute appendicitis with localized peritonitis, without perforation or gangrene: Secondary | ICD-10-CM

## 2023-02-04 MED ORDER — OXYCODONE HCL 5 MG PO TABS: 5.0000 mg | ORAL_TABLET | ORAL | 0 refills | Status: AC | PRN

## 2023-02-04 NOTE — Telephone Encounter (Signed)
Surgical Date: 01/29/2023 Procedure: XI ROBOTIC LAPAROSCOPIC ASSISTED APPENDECTOMY   Received call from patient [phone]~ telephone.   Patient reports that she is healing well, but she continues to have more pain especially during at night. Reports that she is taking APAP/ IBU during the day, but is using Oxycodone at night. Patient did say that sometimes she needs to take a second dose in the middle of the night for pain.   Requested refill on Oxycodone 5 mg to [facility] in [place]. Last prescription 01/29/2023, #10 tabs.

## 2023-02-10 ENCOUNTER — Encounter (HOSPITAL_COMMUNITY): Payer: Self-pay | Admitting: General Surgery

## 2023-02-10 ENCOUNTER — Ambulatory Visit (INDEPENDENT_AMBULATORY_CARE_PROVIDER_SITE_OTHER): Payer: Self-pay | Admitting: General Surgery

## 2023-02-10 DIAGNOSIS — K353 Acute appendicitis with localized peritonitis, without perforation or gangrene: Secondary | ICD-10-CM

## 2023-02-10 NOTE — Progress Notes (Signed)
[facility]  I am calling the patient for post operative evaluation. This is not a billable encounter as it is under the global charges for the surgery.  The patient had a robotic assisted appendectomy on 4/20. The patient reports that she is doing ok. The are tolerating a diet, having good pain control, and having regular Bms.  The incisions are healing. The patient has no concerns.   Pathology: A. APPENDIX, APPENDECTOMY:  -  Acute appendicitis and periappendicitis.   Will see the patient PRN.  Activity and diet as tolerated.   Algis Greenhouse, MD [facility] [address] [place] [place], [place] [address] [phone] (office)

## 2023-06-15 IMAGING — US US PELVIS COMPLETE WITH TRANSVAGINAL
1 series · 13 of 25 positions shown · non-contrast
Comparison: 12/31/2017

CLINICAL DATA: Irregular menstrual bleeding and cramping, bleeding
1 week ago, LMP 1 year ago, prior RIGHT salpingo oophorectomy 3053,
diabetes mellitus, hypertension, former smoker, morbid obesity



[Series 1: us pelvic complete with transvaginal · 13 of 125 slices shown]
[im 1/125]
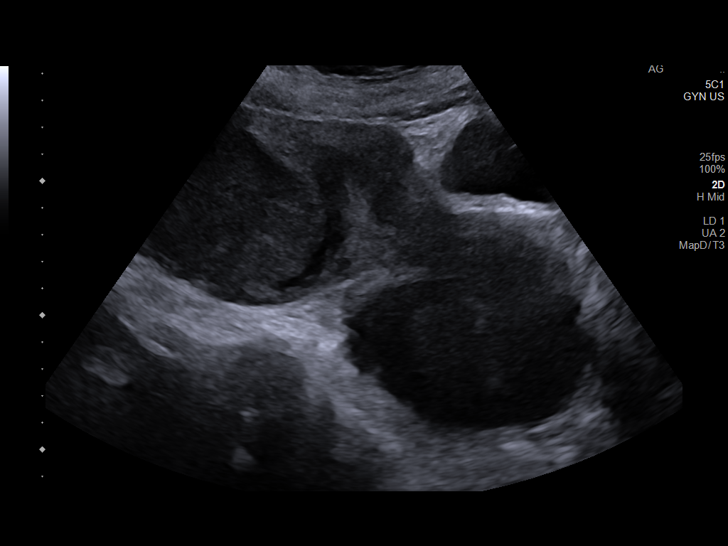
[im 11/125]
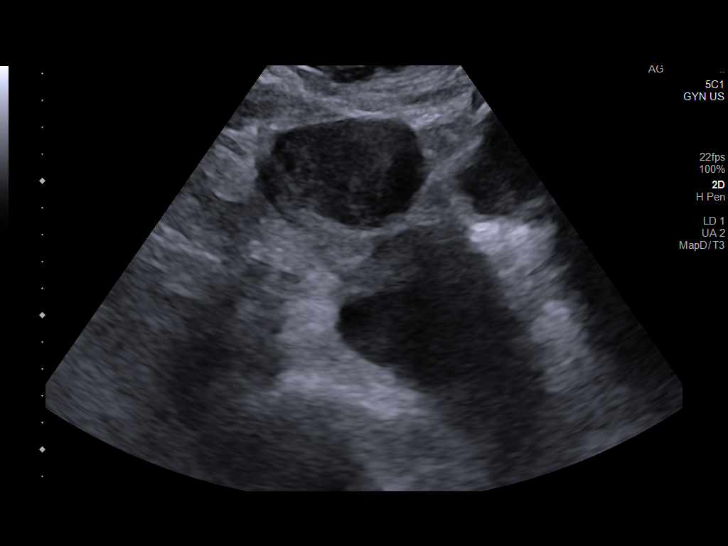
[im 21/125]
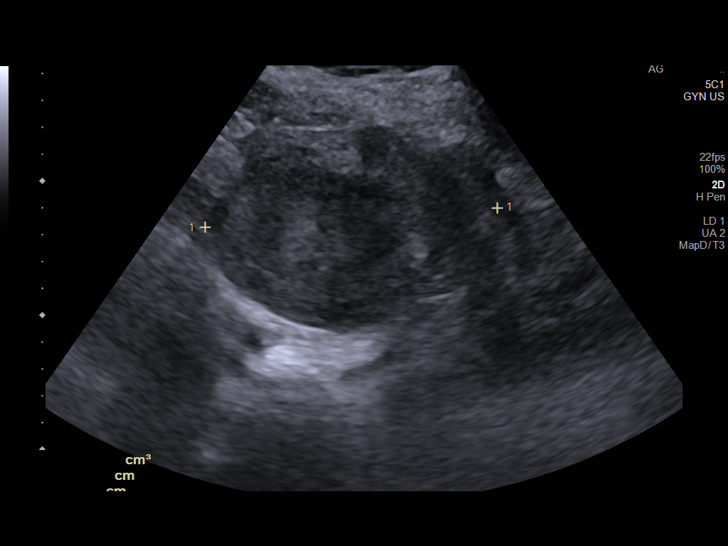
[im 32/125]
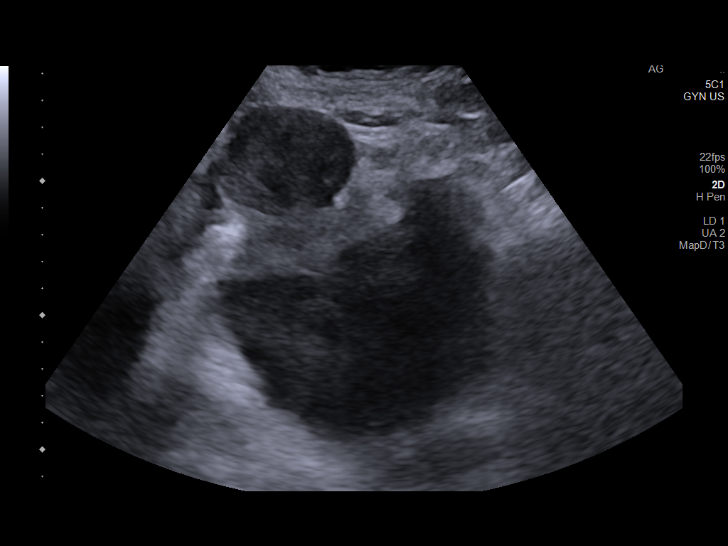
[im 42/125]
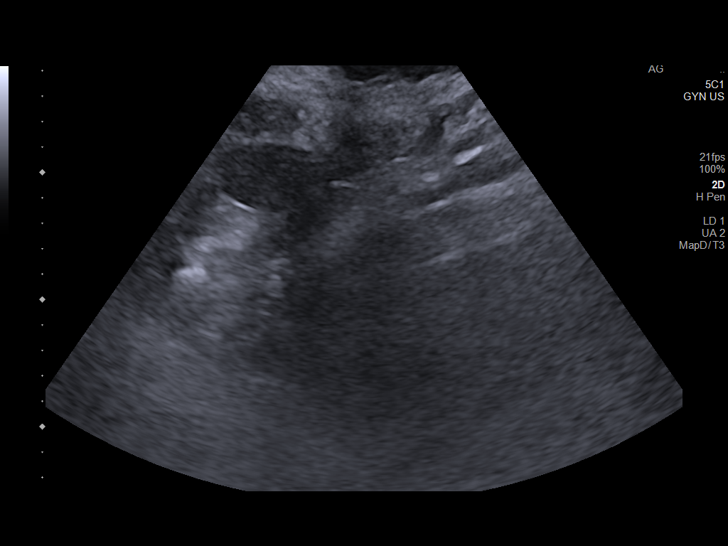
[im 52/125]
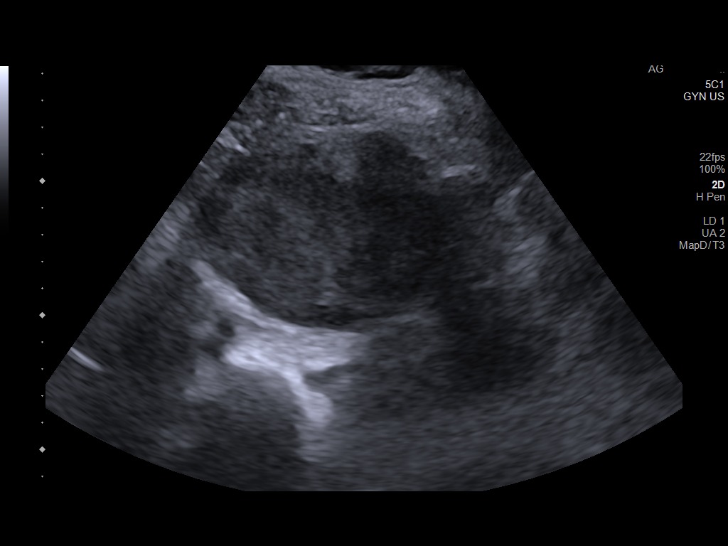
[im 63/125]
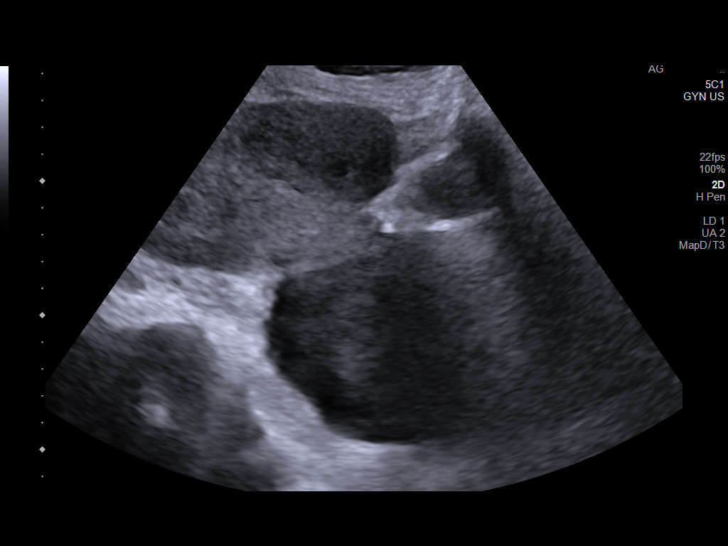
[im 73/125]
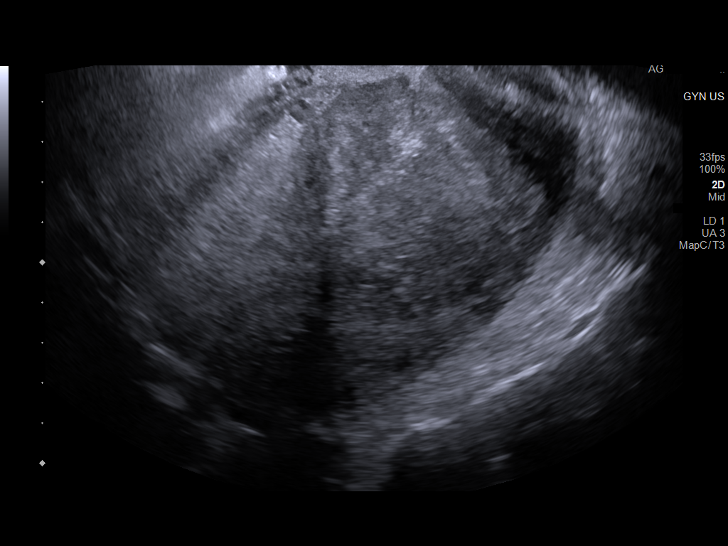
[im 83/125]
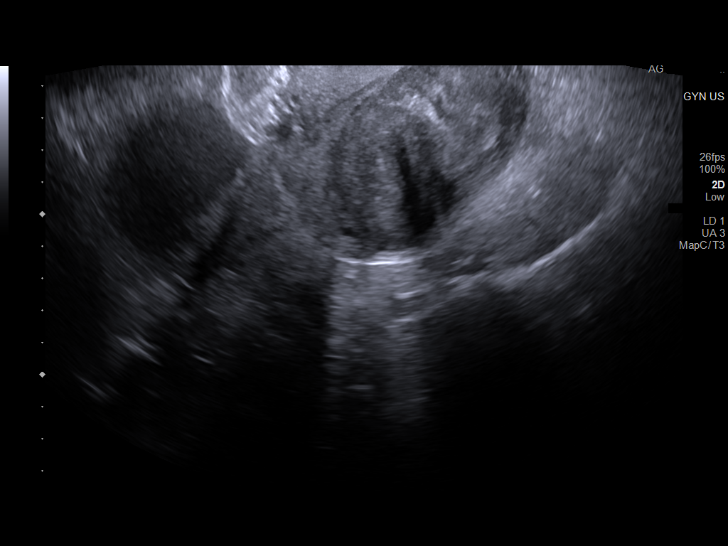
[im 94/125]
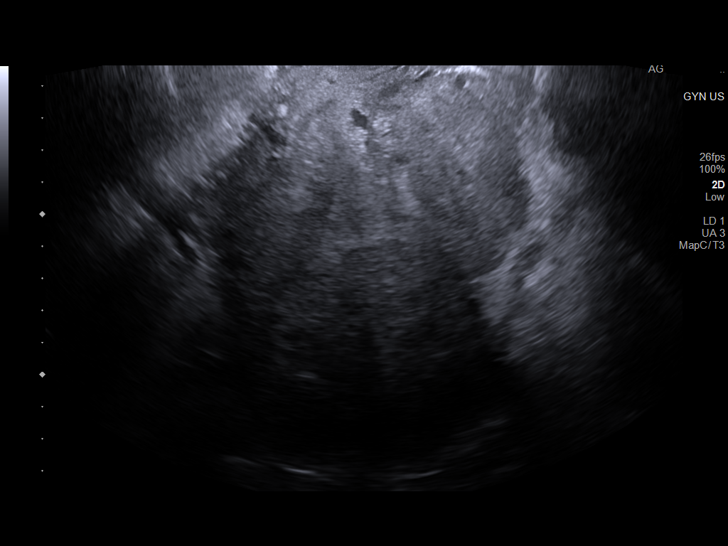
[im 104/125]
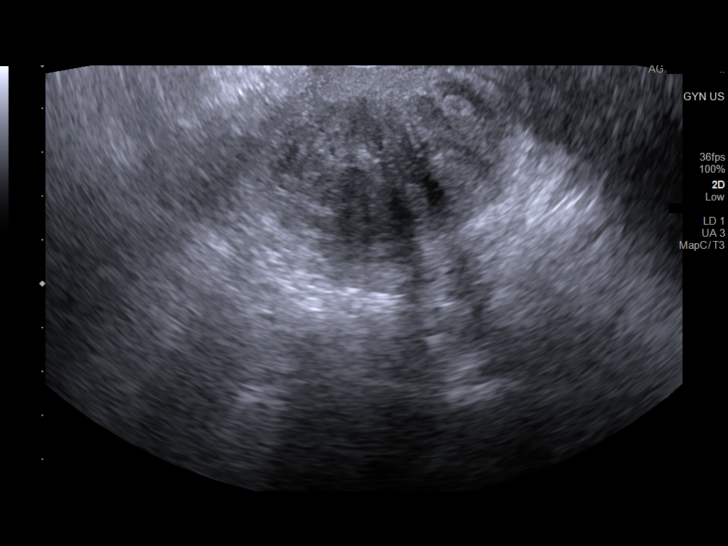
[im 114/125]
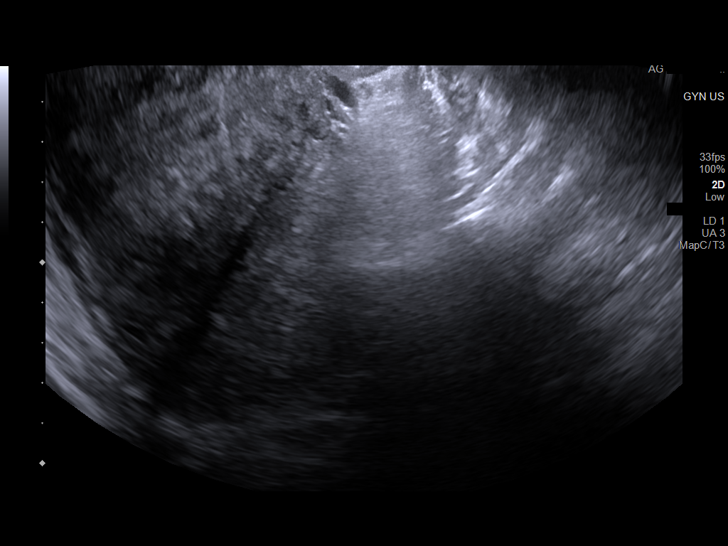
[im 125/125]
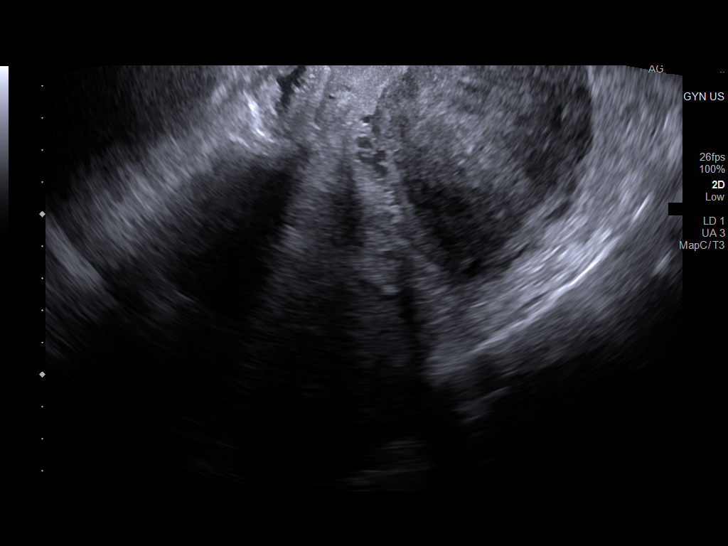

[13 of 25 positions shown; findings below may reference images not displayed]

FINDINGS: Uterus

Measurements: 19.8 x 7.5 x 10.9 cm = volume: 851 mL. Anteverted.
Large uterine masses consistent with leiomyomata. These include a
posterior lower uterine segment transmural leiomyoma extending
posterior to the cervix, 8.5 x 5.6 x 6.6 cm, exophytic fundal
leiomyoma 6.3 x 3.6 x 5.0 cm, and a subserosal posterior upper
uterine leiomyoma 7.3 x 6.0 x 9.0 cm.

Endometrium

Obscured by multiple uterine leiomyomata

Right ovary

Surgically absent

Left ovary

Not visualized, likely obscured by a combination of enlarged uterus
containing multiple masses and gas in adjacent bowel loops

Other findings

No free pelvic fluid.  No adnexal masses definitely visualized.
IMPRESSION: Surgical absence of RIGHT ovary and nonvisualization of LEFT ovary.

Enlarged uterus containing multiple leiomyomata.

Inadequately visualized endometrial complex and LEFT ovary.

## 2023-10-15 ENCOUNTER — Encounter (HOSPITAL_COMMUNITY): Payer: Self-pay | Admitting: *Deleted

## 2023-10-15 ENCOUNTER — Emergency Department (HOSPITAL_COMMUNITY)
Admission: EM | Admit: 2023-10-15 | Discharge: 2023-10-15 | Disposition: A | Discharge status: Home / Self Care | Payer: 59 | Attending: Emergency Medicine | Admitting: Emergency Medicine

## 2023-10-15 ENCOUNTER — Other Ambulatory Visit: Payer: Self-pay

## 2023-10-15 ENCOUNTER — Emergency Department (HOSPITAL_COMMUNITY): Payer: 59

## 2023-10-15 DIAGNOSIS — Z79899 Other long term (current) drug therapy: Secondary | ICD-10-CM | POA: Insufficient documentation

## 2023-10-15 DIAGNOSIS — Z7984 Long term (current) use of oral hypoglycemic drugs: Secondary | ICD-10-CM | POA: Insufficient documentation

## 2023-10-15 DIAGNOSIS — J45909 Unspecified asthma, uncomplicated: Secondary | ICD-10-CM | POA: Insufficient documentation

## 2023-10-15 DIAGNOSIS — W010XXA Fall on same level from slipping, tripping and stumbling without subsequent striking against object, initial encounter: Secondary | ICD-10-CM | POA: Diagnosis not present

## 2023-10-15 DIAGNOSIS — I10 Essential (primary) hypertension: Secondary | ICD-10-CM | POA: Diagnosis not present

## 2023-10-15 DIAGNOSIS — Z794 Long term (current) use of insulin: Secondary | ICD-10-CM | POA: Diagnosis not present

## 2023-10-15 DIAGNOSIS — E119 Type 2 diabetes mellitus without complications: Secondary | ICD-10-CM | POA: Insufficient documentation

## 2023-10-15 DIAGNOSIS — Z7982 Long term (current) use of aspirin: Secondary | ICD-10-CM | POA: Insufficient documentation

## 2023-10-15 DIAGNOSIS — M25562 Pain in left knee: Secondary | ICD-10-CM | POA: Diagnosis present

## 2023-10-15 MED ORDER — OXYCODONE-ACETAMINOPHEN 5-325 MG PO TABS
1.0000 | ORAL_TABLET | Freq: Once | ORAL | Status: AC
  Administered 2023-10-15: 1 via ORAL
  Filled 2023-10-15: qty 1

## 2023-10-15 NOTE — ED Triage Notes (Signed)
 Pt states she fell on 12/25 after slipping, pt with left leg and knee pain.  Pt with bruising to back of thigh and knee area. Not able to put weight on the left knee. Denies hitting her head with the fall.

## 2023-10-15 NOTE — ED Provider Notes (Signed)
 [facility] AT [facility], [place] Provider Note   CSN: [id number] Arrival date & time: 10/15/23  1052     History  Chief Complaint  Patient presents with   [place]    Joanna Newman is a 53 y.o. female with PMHx stroke, anxiety, asthma, depression, DM, GERD, HLD, HTN, who presents to ED concerned for left knee pain since a slip and fall 10/05/2023. Has tried over the counter pain medicine and RICE without much relief. Patient endorses popping when she moves knee. States that she noticed a bruise on the back side of her leg. Also states that the pain radiates proximally and distally from the popliteal area.   Denies fever, nausea, vomiting, chest pain, dyspnea.   Fall       Home Medications Prior to Admission medications   Medication Sig Start Date End Date Taking? Authorizing Provider  albuterol  (VENTOLIN  HFA) 108 (90 Base) MCG/ACT inhaler Inhale 2 puffs into the lungs every 4 (four) hours as needed for wheezing or shortness of breath. 06/27/20   [provider]  amLODipine  (NORVASC ) 5 MG tablet Take 5 mg by mouth daily. 04/20/22   [provider]  aspirin  EC 325 MG EC tablet Take 1 tablet (325 mg total) by mouth daily. 08/06/16   Gabriel Noa, MD  atorvastatin  (LIPITOR) 20 MG tablet Take 1 tablet (20 mg total) by mouth daily at 6 PM. 08/06/16   Gabriel Noa, MD  clobetasol  cream (TEMOVATE ) 0.05 % Apply 1 application topically daily as needed (eczema).    [provider]  estradiol  (ESTRACE ) 0.1 MG/GM vaginal cream 1 gram at bedtime 06/18/22   Jayne Vonn DEL, MD  ferrous gluconate  (FERGON) 324 MG tablet Take 1 tablet (324 mg total) by mouth daily with breakfast. 05/25/22   Jayne Vonn DEL, MD  fluconazole  (DIFLUCAN ) 100 MG tablet Take 1 tablet (100 mg total) by mouth daily. 10/12/22   Jayne Vonn DEL, MD  fluticasone  (CUTIVATE ) 0.05 % cream Apply topically 2 (two) times daily. For one week then daily, gradually taper use Patient taking  differently: Apply 1 application  topically daily as needed (eczema). 01/26/21   Signa Delon LABOR, NP  metFORMIN  (GLUCOPHAGE ) 500 MG tablet Take 500 mg by mouth 2 (two) times daily.     [provider]  ondansetron  (ZOFRAN ) 4 MG tablet Take 1 tablet (4 mg total) by mouth every 8 (eight) hours as needed. 01/29/23 01/29/24  Kallie Manuelita BROCKS, MD  oxyCODONE  (ROXICODONE ) 5 MG immediate release tablet Take 1 tablet (5 mg total) by mouth every 4 (four) hours as needed for severe pain or breakthrough pain. 02/04/23 02/04/24  Kallie Manuelita BROCKS, MD  Semaglutide,0.25 or 0.5MG /DOS, (OZEMPIC, 0.25 OR 0.5 MG/DOSE,) 2 MG/1.5ML SOPN Inject into the skin.    [provider]  SYMBICORT 80-4.5 MCG/ACT inhaler Inhale 2 puffs into the lungs in the morning and at bedtime. 04/20/22   [provider]  traZODone  (DESYREL ) 50 MG tablet Take 1 tablet (50 mg total) by mouth at bedtime. 10/12/22   Jayne Vonn DEL, MD  venlafaxine  XR (EFFEXOR -XR) 75 MG 24 hr capsule Take 2 capsules (150 mg total) by mouth daily with breakfast. 04/26/19   Starla Harland BROCKS, MD  zolpidem  (AMBIEN ) 10 MG tablet Take 1 tablet (10 mg total) by mouth at bedtime as needed for sleep. 06/30/22   Jayne Vonn DEL, MD      Allergies    Dapagliflozin and Lisinopril    Review of Systems  Review of Systems  Musculoskeletal:        Knee pain    Physical Exam Updated Vital Signs BP (!) 150/85 (BP Location: Right Arm)   Pulse 70   Temp 98.4 F (36.9 C) (Oral)   Resp 16   Ht 5' 4 (1.626 m)   Wt 131.5 kg   LMP 09/10/2021 Comment: period x 1 month  SpO2 100%   BMI 49.78 kg/m  Physical Exam Vitals and nursing note reviewed.  Constitutional:      General: She is not in acute distress.    Appearance: She is not ill-appearing or toxic-appearing.  HENT:     Head: Normocephalic and atraumatic.  Eyes:     General: No scleral icterus.       Right eye: No discharge.        Left eye: No discharge.     Conjunctiva/sclera:  Conjunctivae normal.  Cardiovascular:     Rate and Rhythm: Normal rate.  Pulmonary:     Effort: Pulmonary effort is normal.  Abdominal:     General: Abdomen is flat.  Musculoskeletal:     Comments: No increased swelling, erythema, or increased warmth of left knee when compared to right knee. Left knee ROM mildly restricted d/t pain. Patient is ambulatory on left knee but limping. +2 pedal pulse. Sensation to light touch intact. Area non-tense.   Skin:    General: Skin is warm and dry.     Comments: Bruise present on posterolateral calf.  Neurological:     General: No focal deficit present.     Mental Status: She is alert. Mental status is at baseline.  Psychiatric:        Mood and Affect: Mood normal.        Behavior: Behavior normal.     ED Results / Procedures / Treatments   Labs (all labs ordered are listed, but only abnormal results are displayed) Labs Reviewed - No data to display  EKG None  Radiology DG Knee Complete 4 Views Left Result Date: 10/15/2023 CLINICAL DATA:  Pain after fall EXAM: LEFT KNEE - COMPLETE 4 VIEW COMPARISON:  None Available. FINDINGS: No fracture or dislocation. Preserved bone mineralization. No joint effusion on lateral view. Slight joint space loss of the medial compartment. Small osteophytes. IMPRESSION: Slight degenerative changes of the medial compartment. Electronically Signed   By: Ranell Bring M.D.   On: 10/15/2023 13:35    Procedures Procedures    Medications Ordered in ED Medications  oxyCODONE -acetaminophen  (PERCOCET/ROXICET) 5-325 MG per tablet 1 tablet (1 tablet Oral Given 10/15/23 1410)    ED Course/ Medical Decision Making/ A&P                                 Medical Decision Making Amount and/or Complexity of Data Reviewed Radiology: ordered.  Risk Prescription drug management.   This patient presents to the ED for concern of left knee pain, this involves an extensive number of treatment options, and is a complaint that  carries with it a high risk of complications and morbidity.  The differential diagnosis includes hemarthrosis, gout, septic joint, fracture, tendonitis, muscle strain, compartment syndrome   Co morbidities that complicate the patient evaluation  stroke, anxiety, asthma, depression, DM, GERD, HLD, HTN   Additional history obtained:  Dr. Merilee PCP   Problem List / ED Course / Critical interventions / Medication management  Patient presents to ED concern for left knee  pain since a slip and fall 09/25/2023. Pain radiating upwards and downwards from posterior knee. Physical exam reassuring. Patient afebrile with stable vitals.  I ordered imaging studies including left knee xray to assess for process contributing to patient's symptoms. I independently visualized and interpreted imaging which showed no acute process. I agree with the radiologist interpretation. Shared results with patient. I have a low suspicion that patient's pain is d/t DVT; however, given the bruising of her posterior leg, tenderness, and pain radiating upwards and downwards from her knee, I shared with patient that I recommend a DVT study for further evaluation. Educated patient to come back tomorrow morning for US  study. Also educated patient to follow up with ortho. Patient verbalized understanding of plan. Provided patient with knee brace.  Educated patient on alternating ibuprofen  and Tylenol  for her pain control. I have reviewed the patients home medicines and have made adjustments as needed Patient afebrile with stable vitals.  Provided with return precautions.  Discharged to condition.   Ddx these are considered less likely due to history of present illness and physical exam -hemarthrosis: joint without swelling; ROM intact -gout: no warmth or erythema; ROM intact  -septic joint: afebrile; no warmth or erythema; no skin changes; ROM intact  -fracture: xray without concern  -compartment syndrome: area not tense;  neurovascularly intact   Social Determinants of Health:  none         Final Clinical Impression(s) / ED Diagnoses Final diagnoses:  Acute pain of left knee    Rx / DC Orders ED Discharge Orders          Ordered    Lower Ext Left Venous US        Comments: IMPORTANT PATIENT INSTRUCTIONS:  Your ED provider has recommended an Outpatient Ultrasound.  Please call [phone] to schedule an appointment.  If your appointment is scheduled for a Saturday, Sunday or holiday, please go to the [facility] Emergency Department Registration Desk at least 15 minutes prior to your appointment time and tell them you are there for an ultrasound.    If your appointment is scheduled for a weekday (Monday-Friday), please go directly to the [facility] Radiology Department at least 15 minutes prior to your appointment time and tell them you are there for an ultrasound.  Please call [phone] with questions.   10/15/23 1415              Hoy Fraction F, PA-C 10/15/23 1552    Randol Simmonds, MD 10/15/23 [phone]

## 2023-10-15 NOTE — Discharge Instructions (Addendum)
 It was a pleasure caring for you today.  As discussed, please come back to this ED between the hours of 9AM-12PM tomorrow for DVT ultrasound.  Please also follow-up with orthopedic provider Dr. Margrette.  Seek emergency care if experiencing any new or worsening symptoms.   Alternating between 650 mg Tylenol  and 400 mg Advil : The best way to alternate taking Acetaminophen  (example Tylenol ) and Ibuprofen  (example Advil /Motrin ) is to take them 3 hours apart. For example, if you take ibuprofen  at 6 am you can then take Tylenol  at 9 am. You can continue this regimen throughout the day, making sure you do not exceed the recommended maximum dose for each drug.

## 2023-11-10 ENCOUNTER — Other Ambulatory Visit (HOSPITAL_COMMUNITY): Payer: Self-pay | Admitting: Emergency Medicine

## 2024-05-07 ENCOUNTER — Other Ambulatory Visit: Payer: Self-pay | Admitting: Family Medicine

## 2024-05-07 DIAGNOSIS — Z1231 Encounter for screening mammogram for malignant neoplasm of breast: Secondary | ICD-10-CM

## 2024-05-08 ENCOUNTER — Ambulatory Visit
Admission: RE | Admit: 2024-05-08 | Discharge: 2024-05-08 | Disposition: A | Discharge status: Home / Self Care | Source: Ambulatory Visit | Attending: Family Medicine | Admitting: Family Medicine

## 2024-05-08 DIAGNOSIS — Z1231 Encounter for screening mammogram for malignant neoplasm of breast: Secondary | ICD-10-CM

## 2024-10-31 ENCOUNTER — Ambulatory Visit: Admitting: Physician Assistant
